# Patient Record
Sex: Female | Born: 1960 | Race: White | Hispanic: No | Smoking: Current some day smoker
Health system: Southern US, Community
[De-identification: ages and names within clinical notes are randomized; demographics above are authoritative.]

## PROBLEM LIST (undated history)

## (undated) DIAGNOSIS — F32A Depression, unspecified: Secondary | ICD-10-CM

## (undated) DIAGNOSIS — G8929 Other chronic pain: Secondary | ICD-10-CM

## (undated) DIAGNOSIS — F319 Bipolar disorder, unspecified: Secondary | ICD-10-CM

## (undated) DIAGNOSIS — G2581 Restless legs syndrome: Secondary | ICD-10-CM

## (undated) DIAGNOSIS — F329 Major depressive disorder, single episode, unspecified: Secondary | ICD-10-CM

## (undated) DIAGNOSIS — I10 Essential (primary) hypertension: Secondary | ICD-10-CM

## (undated) DIAGNOSIS — F419 Anxiety disorder, unspecified: Secondary | ICD-10-CM

## (undated) DIAGNOSIS — E7800 Pure hypercholesterolemia, unspecified: Secondary | ICD-10-CM

## (undated) DIAGNOSIS — R519 Headache, unspecified: Secondary | ICD-10-CM

## (undated) DIAGNOSIS — R51 Headache: Secondary | ICD-10-CM

## (undated) HISTORY — PX: ABDOMINAL HYSTERECTOMY: SHX81

## (undated) HISTORY — PX: TONSILLECTOMY: SUR1361

## (undated) HISTORY — PX: OOPHORECTOMY: SHX86

---

## 1998-01-02 ENCOUNTER — Ambulatory Visit (HOSPITAL_COMMUNITY): Admission: RE | Admit: 1998-01-02 | Discharge: 1998-01-02 | Payer: Self-pay | Admitting: Obstetrics and Gynecology

## 1998-06-19 ENCOUNTER — Other Ambulatory Visit: Admission: RE | Admit: 1998-06-19 | Discharge: 1998-06-19 | Payer: Self-pay | Admitting: Obstetrics and Gynecology

## 1999-08-02 ENCOUNTER — Other Ambulatory Visit: Admission: RE | Admit: 1999-08-02 | Discharge: 1999-08-02 | Payer: Self-pay | Admitting: Obstetrics and Gynecology

## 2000-06-05 ENCOUNTER — Encounter: Admission: RE | Admit: 2000-06-05 | Discharge: 2000-06-05 | Payer: Self-pay | Admitting: Obstetrics and Gynecology

## 2000-06-05 ENCOUNTER — Encounter: Payer: Self-pay | Admitting: Obstetrics and Gynecology

## 2001-07-08 ENCOUNTER — Encounter: Payer: Self-pay | Admitting: Obstetrics and Gynecology

## 2001-07-08 ENCOUNTER — Encounter: Admission: RE | Admit: 2001-07-08 | Discharge: 2001-07-08 | Payer: Self-pay | Admitting: Obstetrics and Gynecology

## 2001-09-28 ENCOUNTER — Other Ambulatory Visit: Admission: RE | Admit: 2001-09-28 | Discharge: 2001-09-28 | Payer: Self-pay | Admitting: Obstetrics and Gynecology

## 2002-08-26 ENCOUNTER — Encounter (INDEPENDENT_AMBULATORY_CARE_PROVIDER_SITE_OTHER): Payer: Self-pay

## 2002-08-26 ENCOUNTER — Observation Stay (HOSPITAL_COMMUNITY): Admission: RE | Admit: 2002-08-26 | Discharge: 2002-08-27 | Payer: Self-pay | Admitting: Obstetrics and Gynecology

## 2002-08-26 ENCOUNTER — Encounter (INDEPENDENT_AMBULATORY_CARE_PROVIDER_SITE_OTHER): Payer: Self-pay | Admitting: Specialist

## 2003-01-31 ENCOUNTER — Other Ambulatory Visit: Admission: RE | Admit: 2003-01-31 | Discharge: 2003-01-31 | Payer: Self-pay | Admitting: Obstetrics and Gynecology

## 2003-07-14 ENCOUNTER — Encounter: Payer: Self-pay | Admitting: Emergency Medicine

## 2003-07-14 ENCOUNTER — Emergency Department (HOSPITAL_COMMUNITY): Admission: EM | Admit: 2003-07-14 | Discharge: 2003-07-14 | Payer: Self-pay | Admitting: Emergency Medicine

## 2003-07-20 ENCOUNTER — Encounter: Payer: Self-pay | Admitting: General Surgery

## 2003-07-20 ENCOUNTER — Ambulatory Visit (HOSPITAL_COMMUNITY): Admission: RE | Admit: 2003-07-20 | Discharge: 2003-07-20 | Payer: Self-pay | Admitting: General Surgery

## 2003-08-09 ENCOUNTER — Encounter: Payer: Self-pay | Admitting: Obstetrics and Gynecology

## 2003-08-09 ENCOUNTER — Encounter: Admission: RE | Admit: 2003-08-09 | Discharge: 2003-08-09 | Payer: Self-pay | Admitting: Obstetrics and Gynecology

## 2003-08-16 ENCOUNTER — Encounter: Payer: Self-pay | Admitting: Gastroenterology

## 2003-08-16 ENCOUNTER — Encounter: Admission: RE | Admit: 2003-08-16 | Discharge: 2003-08-16 | Payer: Self-pay | Admitting: Gastroenterology

## 2003-09-28 IMAGING — CT CT ABDOMEN W/ CM
1 of 4 series · 14 of 32 positions shown, 19 images · non-contrast
Comparison: none

FINDINGS
CLINICAL DATA: RIGHT FLANK AND PELVIC PAIN TIMES THREE DAYS.
CT OF THE ABDOMEN AND PELVIS, 07/14/03
COMPARISONS:  NONE.
TECHNIQUE: CONTIGUOUS 5 MM AXIAL IMAGES WERE OBTAINED FROM THE LUNG BASES THROUGH THE PUBIC
SYMPHYSIS AFTER SPIRAL CT SCANNING OF THE ABDOMEN AND PELVIS.  ORAL AND 150 CC OF NONIONIC
INTRAVENOUS CONTRAST WAS ADMINISTERED PRIOR TO SCANNING.
ABDOMEN
SEVERAL VERY TINY LOW DENSITY LESIONS ARE IDENTIFIED SCATTERED THROUGHOUT THE LIVER PARENCHYMA.
SPLEEN, STOMACH, DUODENUM, PANCREAS, GALLBLADDER, AND ADRENAL GLANDS ARE UNREMARKABLE.  NO FOCAL
ABNORMALITY IS SEEN IN THE RIGHT KIDNEY BUT THERE IS A VERY TINY LOW DENSITY FOCUS IDENTIFIED IN
THE LEFT KIDNEY, TOO SMALL TO CHARACTERIZE.
PATIENT HAS A RETROCECAL APPENDIX WHICH EXTENDS IN A CRANIAL FASHION TO THE INFERIOR TIP OF THE
LIVER.  THERE IS NO EVIDENCE FOR APPENDICEAL THICKENING OR PERIAPPENDICEAL INFLAMMATION.
IMPRESSION
UNREMARKABLE APPEARANCE OF THE APPENDIX.  NO EVIDENCE FOR ACUTE APPENDICITIS.
VERY TINY LOW DENSITY LESIONS SCATTERED IN THE LIVER AND ALSO IN THE LEFT KIDNEY.  THESE ARE
TECHNICALLY TOO SMALL TO CHARACTERIZE.
PELVIS
SCANNING THROUGH THE ANATOMIC PELVIS REVEALS NO FREE FLUID.  THE PATIENT IS STATUS POST
HYSTERECTOMY.  NO ADNEXAL MASSES.  NO EVIDENCE FOR LYMPHADENOPATHY.
STATUS POST HYSTERECTOMY.
OTHERWISE, UNREMARKABLE CT EXAM OF THE PELVIS.

[Series 3: abd/pelvis 5.0 b30f · axial · 0.61mm/px · z∈[-457,-47]mm · 14 of 92 slices shown, 19 images]
[im 5/92  soft-tissue]
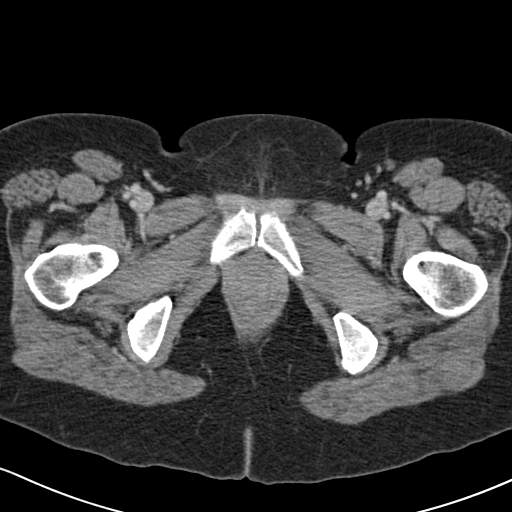
[im 5/92  bone]
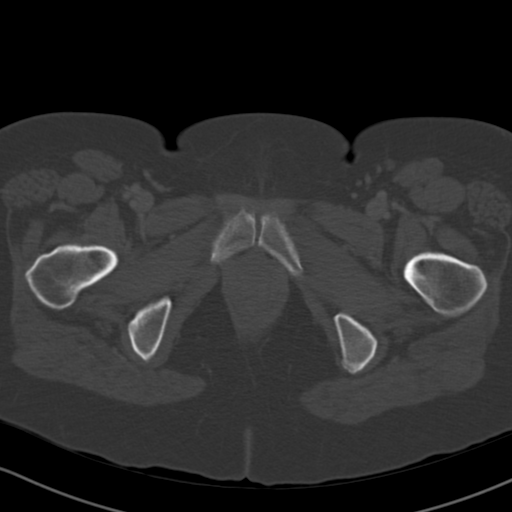
[im 15/92  soft-tissue]
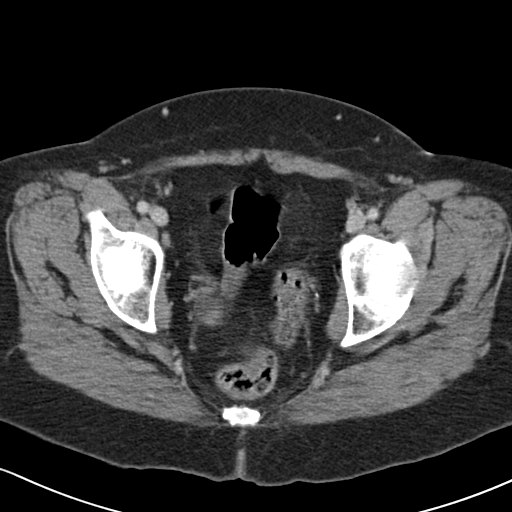
[im 20/92  soft-tissue]
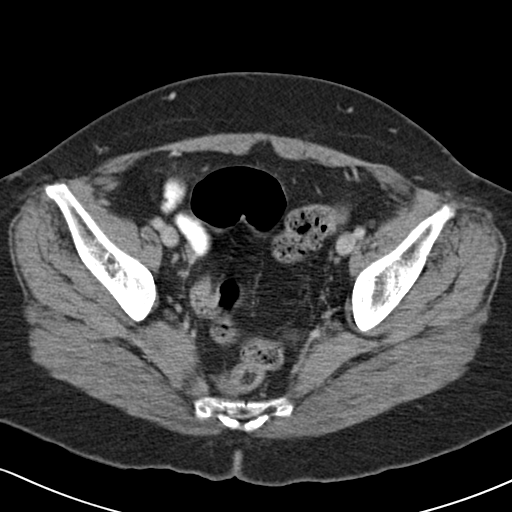
[im 24/92  soft-tissue]
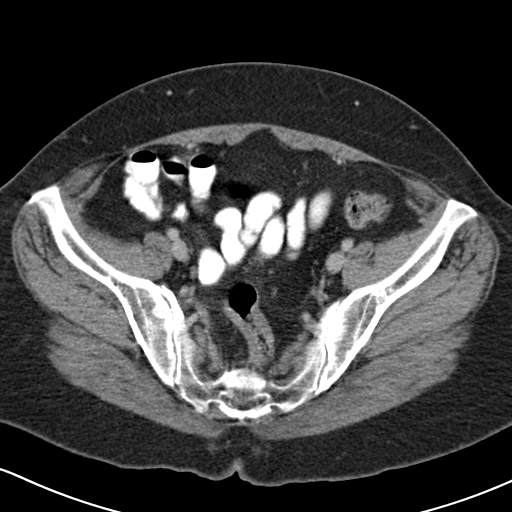
[im 34/92  soft-tissue]
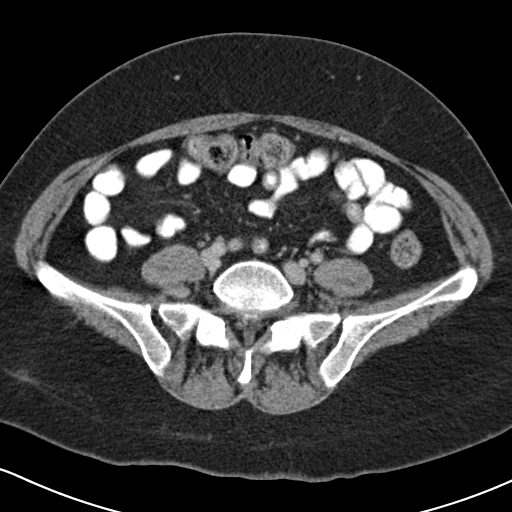
[im 39/92  soft-tissue]
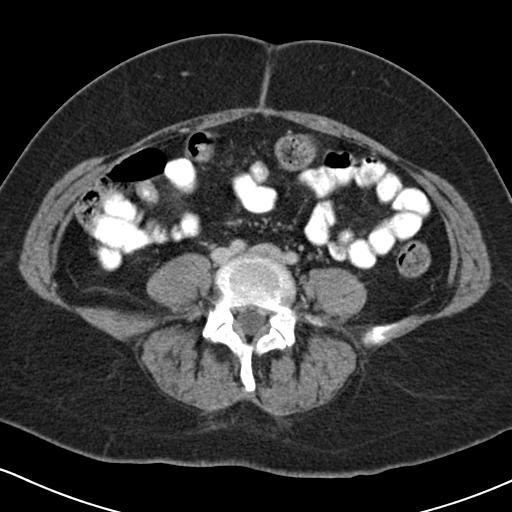
[im 48/92  soft-tissue]
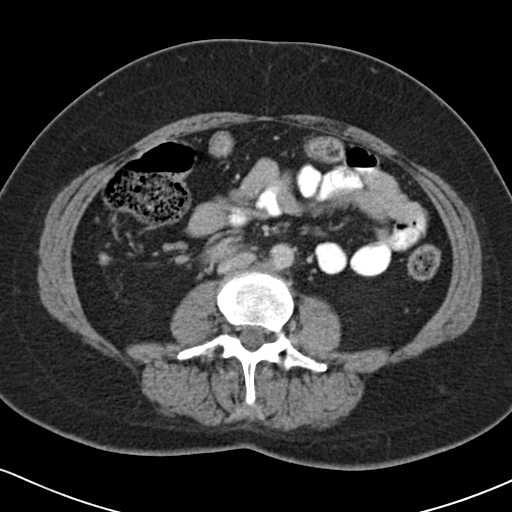
[im 53/92  soft-tissue]
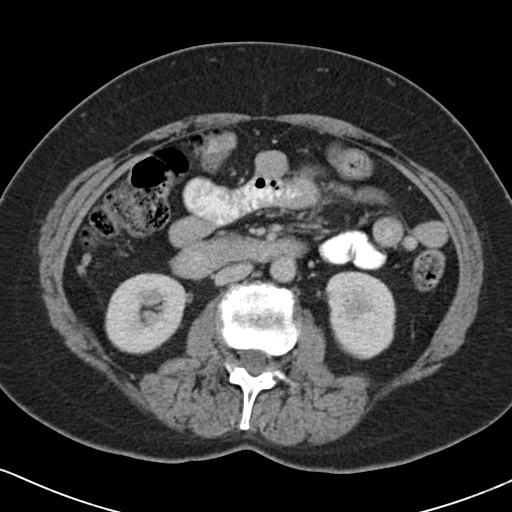
[im 58/92  soft-tissue]
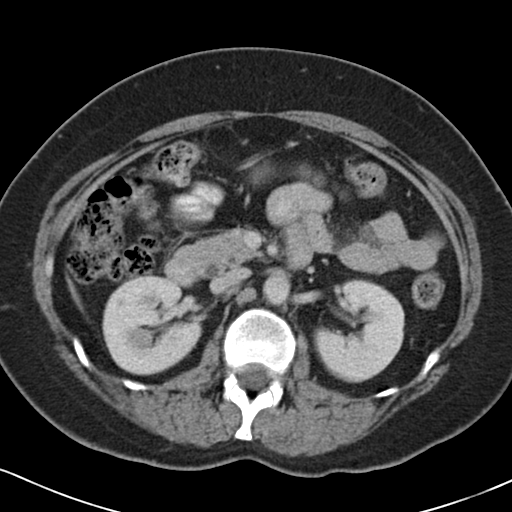
[im 58/92  bone]
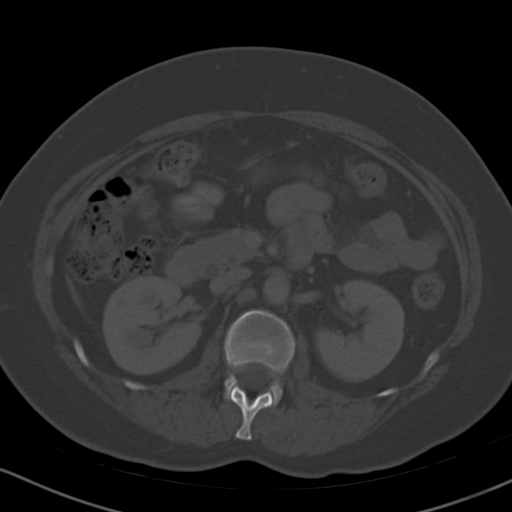
[im 68/92  soft-tissue]
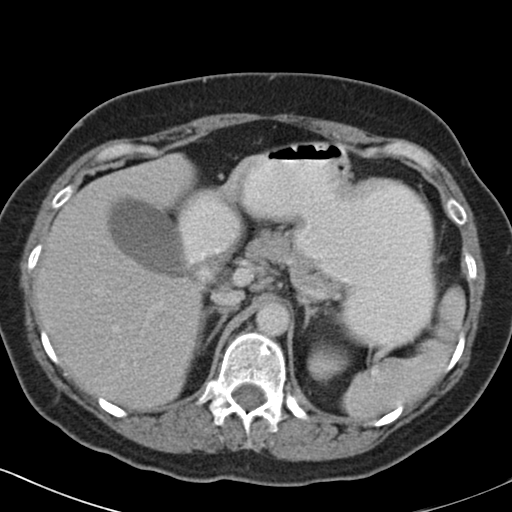
[im 72/92  soft-tissue]
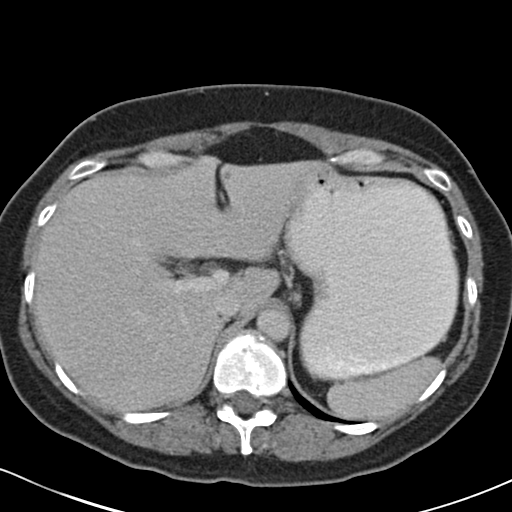
[im 72/92  lung]
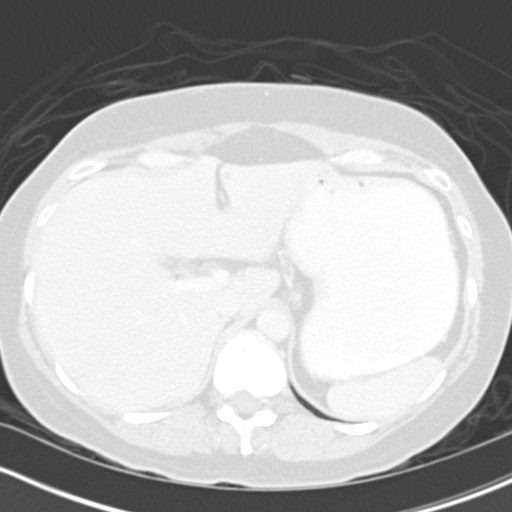
[im 77/92  soft-tissue]
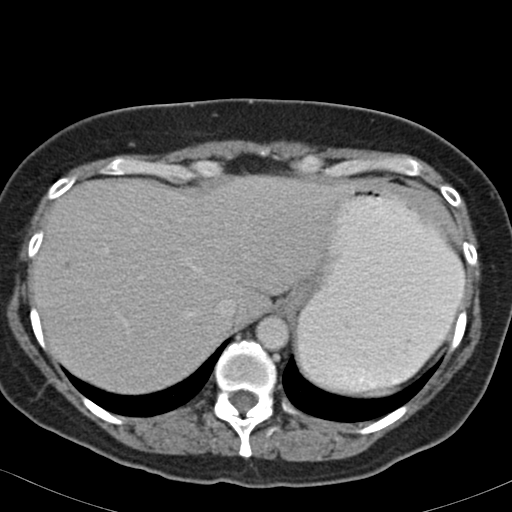
[im 77/92  lung]
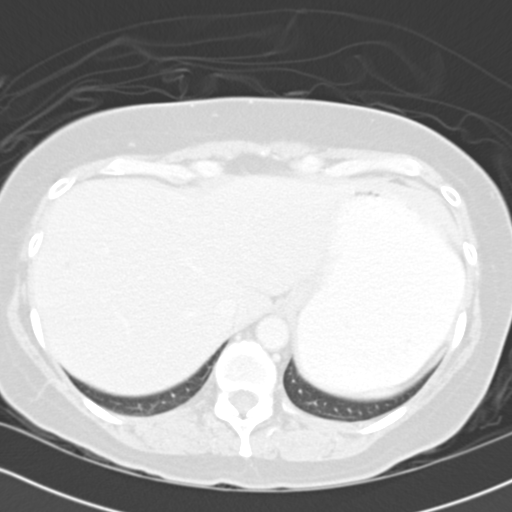
[im 82/92  lung]
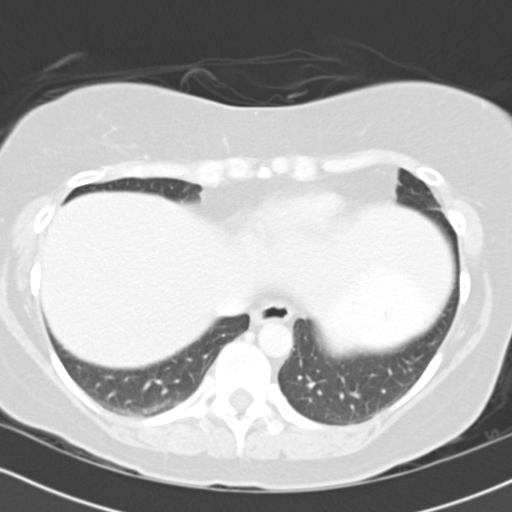
[im 87/92  soft-tissue]
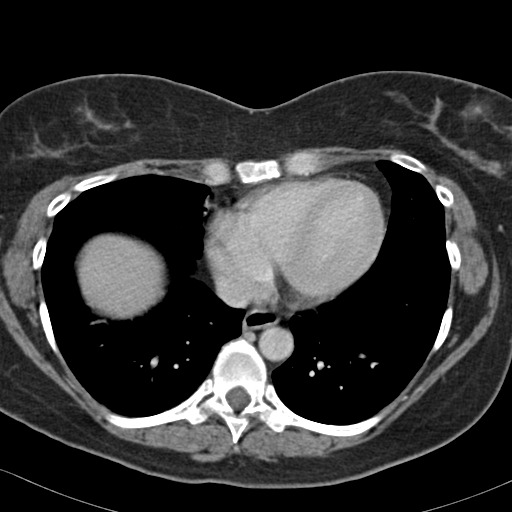
[im 87/92  lung]
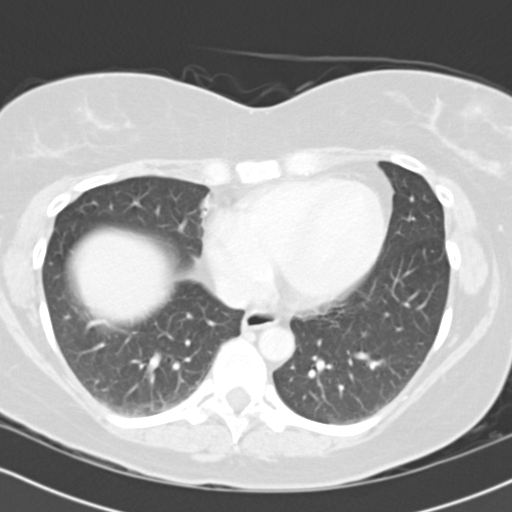

[14 of 32 positions shown; findings below may reference images not displayed]

## 2004-07-17 ENCOUNTER — Emergency Department (HOSPITAL_COMMUNITY): Admission: EM | Admit: 2004-07-17 | Discharge: 2004-07-17 | Payer: Self-pay | Admitting: Emergency Medicine

## 2004-07-18 ENCOUNTER — Ambulatory Visit (HOSPITAL_COMMUNITY): Admission: RE | Admit: 2004-07-18 | Discharge: 2004-07-18 | Payer: Self-pay | Admitting: Gastroenterology

## 2005-01-19 ENCOUNTER — Emergency Department (HOSPITAL_COMMUNITY): Admission: EM | Admit: 2005-01-19 | Discharge: 2005-01-19 | Payer: Self-pay | Admitting: Emergency Medicine

## 2005-09-10 ENCOUNTER — Inpatient Hospital Stay (HOSPITAL_COMMUNITY): Admission: EM | Admit: 2005-09-10 | Discharge: 2005-09-12 | Payer: Self-pay | Admitting: Emergency Medicine

## 2005-09-12 ENCOUNTER — Inpatient Hospital Stay (HOSPITAL_COMMUNITY): Admission: RE | Admit: 2005-09-12 | Discharge: 2005-09-18 | Payer: Self-pay | Admitting: Psychiatry

## 2005-09-12 ENCOUNTER — Ambulatory Visit: Payer: Self-pay | Admitting: Psychiatry

## 2005-09-30 ENCOUNTER — Ambulatory Visit (HOSPITAL_COMMUNITY): Payer: Self-pay | Admitting: Psychiatry

## 2005-10-20 ENCOUNTER — Ambulatory Visit (HOSPITAL_COMMUNITY): Payer: Self-pay | Admitting: Psychiatry

## 2005-11-07 ENCOUNTER — Ambulatory Visit (HOSPITAL_COMMUNITY): Payer: Self-pay | Admitting: Psychiatry

## 2005-12-10 ENCOUNTER — Ambulatory Visit (HOSPITAL_COMMUNITY): Payer: Self-pay | Admitting: Psychiatry

## 2005-12-10 ENCOUNTER — Other Ambulatory Visit: Admission: RE | Admit: 2005-12-10 | Discharge: 2005-12-10 | Payer: Self-pay | Admitting: Obstetrics and Gynecology

## 2005-12-25 ENCOUNTER — Ambulatory Visit (HOSPITAL_COMMUNITY): Admission: RE | Admit: 2005-12-25 | Discharge: 2005-12-25 | Payer: Self-pay | Admitting: Obstetrics and Gynecology

## 2006-04-29 ENCOUNTER — Emergency Department (HOSPITAL_COMMUNITY): Admission: EM | Admit: 2006-04-29 | Discharge: 2006-04-29 | Payer: Self-pay | Admitting: Emergency Medicine

## 2006-06-16 ENCOUNTER — Emergency Department (HOSPITAL_COMMUNITY): Admission: EM | Admit: 2006-06-16 | Discharge: 2006-06-17 | Payer: Self-pay | Admitting: Emergency Medicine

## 2007-05-11 ENCOUNTER — Other Ambulatory Visit: Admission: RE | Admit: 2007-05-11 | Discharge: 2007-05-11 | Payer: Self-pay | Admitting: Obstetrics and Gynecology

## 2007-11-16 ENCOUNTER — Ambulatory Visit: Payer: Self-pay | Admitting: *Deleted

## 2007-11-16 ENCOUNTER — Inpatient Hospital Stay (HOSPITAL_COMMUNITY): Admission: AD | Admit: 2007-11-16 | Discharge: 2007-11-25 | Payer: Self-pay | Admitting: Psychiatry

## 2007-11-20 ENCOUNTER — Ambulatory Visit (HOSPITAL_COMMUNITY): Admission: RE | Admit: 2007-11-20 | Discharge: 2007-11-20 | Payer: Self-pay | Admitting: Psychiatry

## 2009-01-03 ENCOUNTER — Ambulatory Visit: Payer: Self-pay | Admitting: Psychiatry

## 2009-01-03 ENCOUNTER — Inpatient Hospital Stay (HOSPITAL_COMMUNITY): Admission: AD | Admit: 2009-01-03 | Discharge: 2009-01-18 | Payer: Self-pay | Admitting: Psychiatry

## 2009-01-05 ENCOUNTER — Ambulatory Visit (HOSPITAL_COMMUNITY): Admission: RE | Admit: 2009-01-05 | Discharge: 2009-01-05 | Payer: Self-pay | Admitting: Psychiatry

## 2009-01-19 ENCOUNTER — Other Ambulatory Visit (HOSPITAL_COMMUNITY): Admission: RE | Admit: 2009-01-19 | Discharge: 2009-02-23 | Payer: Self-pay | Admitting: Psychiatry

## 2009-10-17 DIAGNOSIS — F429 Obsessive-compulsive disorder, unspecified: Secondary | ICD-10-CM | POA: Insufficient documentation

## 2009-11-16 ENCOUNTER — Encounter: Admission: RE | Admit: 2009-11-16 | Discharge: 2009-11-16 | Payer: Self-pay | Admitting: Family Medicine

## 2009-12-03 DIAGNOSIS — M8580 Other specified disorders of bone density and structure, unspecified site: Secondary | ICD-10-CM | POA: Insufficient documentation

## 2010-05-14 DIAGNOSIS — E785 Hyperlipidemia, unspecified: Secondary | ICD-10-CM | POA: Insufficient documentation

## 2010-05-14 DIAGNOSIS — I10 Essential (primary) hypertension: Secondary | ICD-10-CM | POA: Insufficient documentation

## 2010-08-20 ENCOUNTER — Encounter: Admission: RE | Admit: 2010-08-20 | Discharge: 2010-08-20 | Payer: Self-pay | Admitting: Family Medicine

## 2010-08-20 DIAGNOSIS — F309 Manic episode, unspecified: Secondary | ICD-10-CM | POA: Insufficient documentation

## 2010-09-17 DIAGNOSIS — F3289 Other specified depressive episodes: Secondary | ICD-10-CM | POA: Insufficient documentation

## 2010-09-17 DIAGNOSIS — F329 Major depressive disorder, single episode, unspecified: Secondary | ICD-10-CM | POA: Insufficient documentation

## 2010-10-07 ENCOUNTER — Encounter
Admission: RE | Admit: 2010-10-07 | Discharge: 2010-11-21 | Payer: Self-pay | Source: Home / Self Care | Attending: Family Medicine | Admitting: Family Medicine

## 2010-12-23 ENCOUNTER — Other Ambulatory Visit: Payer: Self-pay | Admitting: Family Medicine

## 2010-12-30 ENCOUNTER — Encounter
Admission: RE | Admit: 2010-12-30 | Discharge: 2010-12-30 | Payer: Self-pay | Source: Home / Self Care | Attending: Family Medicine | Admitting: Family Medicine

## 2011-01-01 ENCOUNTER — Other Ambulatory Visit: Payer: Self-pay | Admitting: Family Medicine

## 2011-01-01 DIAGNOSIS — R928 Other abnormal and inconclusive findings on diagnostic imaging of breast: Secondary | ICD-10-CM

## 2011-01-08 ENCOUNTER — Ambulatory Visit
Admission: RE | Admit: 2011-01-08 | Discharge: 2011-01-08 | Disposition: A | Discharge status: Home / Self Care | Payer: MEDICARE | Source: Ambulatory Visit | Attending: Family Medicine | Admitting: Family Medicine

## 2011-01-08 DIAGNOSIS — R928 Other abnormal and inconclusive findings on diagnostic imaging of breast: Secondary | ICD-10-CM

## 2011-03-18 LAB — CBC
MCV: 89.8 fL (ref 78.0–100.0)
Platelets: 429 10*3/uL — ABNORMAL HIGH (ref 150–400)
RDW: 13.4 % (ref 11.5–15.5)
WBC: 8.4 10*3/uL (ref 4.0–10.5)

## 2011-03-18 LAB — RAPID STREP SCREEN (MED CTR MEBANE ONLY): Streptococcus, Group A Screen (Direct): NEGATIVE

## 2011-03-18 LAB — COMPREHENSIVE METABOLIC PANEL
AST: 25 U/L (ref 0–37)
Albumin: 4 g/dL (ref 3.5–5.2)
Calcium: 9.5 mg/dL (ref 8.4–10.5)
Chloride: 101 mEq/L (ref 96–112)
Creatinine, Ser: 1.07 mg/dL (ref 0.4–1.2)
GFR calc Af Amer: >60 mL/min (ref >60)
Total Bilirubin: 0.5 mg/dL (ref 0.3–1.2)
Total Protein: 6.8 g/dL (ref 6.0–8.3)

## 2011-03-18 LAB — RPR: RPR Ser Ql: NONREACTIVE

## 2011-03-18 LAB — URINALYSIS, ROUTINE W REFLEX MICROSCOPIC
Bilirubin Urine: NEGATIVE
Nitrite: NEGATIVE
Protein, ur: NEGATIVE mg/dL
Specific Gravity, Urine: 1.006 (ref 1.005–1.030)
Urobilinogen, UA: 0.2 mg/dL (ref 0.0–1.0)

## 2011-03-18 LAB — TSH: TSH: 1.915 u[IU]/mL (ref 0.350–4.500)

## 2011-03-18 LAB — VITAMIN B12: Vitamin B-12: 630 pg/mL (ref 211–911)

## 2011-03-18 LAB — MAGNESIUM: Magnesium: 2.5 mg/dL (ref 1.5–2.5)

## 2011-03-24 DIAGNOSIS — G2581 Restless legs syndrome: Secondary | ICD-10-CM | POA: Insufficient documentation

## 2011-04-15 NOTE — Discharge Summary (Signed)
NAMEKANAE, IGNATOWSKI            ACCOUNT NO.:  [id number]   MEDICAL RECORD NO.:  [id number]          PATIENT TYPE:  IPS   LOCATION:  0504                          [facility]ILITY[facility]  [facility]   PHYSICIAN:  Anselm Jungling, MD  DATE OF BIRTH:  02-Jan-1961   DATE OF ADMISSION:  01/03/2009  DATE OF DISCHARGE:  01/18/2009                               DISCHARGE SUMMARY   IDENTIFYING DATA AND REASON FOR ADMISSION:  This was an inpatient  psychiatric hospitalization for [name] Hinton, a 50 year old single white  female admitted for increasing depression and suicidal ideation.  Please  refer to the admission note for further details pertaining to the  symptoms, circumstances and history that led to her hospitalization.  She was given an initial Axis I diagnosis of major depressive disorder,  recurrent.   MEDICAL AND LABORATORY:  The patient was medically and physically  assessed by the psychiatric nurse practitioner.  She came to [place] with a  history of hypertension and GERD.  She was continued on her usual  hydrochlorothiazide, Flonase, and Protonix.  There were no significant  medical issues.   HOSPITAL COURSE:  The patient was admitted to the adult inpatient  psychiatric service.  She presented as a well-nourished, normally-  developed female who was alert, fully oriented, pleasant, but sad.  She  stated I want to die.  There were no signs or symptoms of psychosis.  She reported insomnia.  She denied any suicidal intent, verbalized a  strong desire for help.   She had come to [place] on a regimen of Wellbutrin, Lamictal, Klonopin,  Abilify, and trazodone.  These were all continued, and in addition, a  trial of Lexapro 10 mg daily was initiated.  It was well tolerated.  She  participated in various therapeutic groups and activities and was a good  participant throughout.   There was a family session on the fourth hospital day involving the  patient and her parents, with whom she normally lives.   The  patient's progress was slow.  She continued depressed, hopeless,  with intermittent suicidal ideation over the initial 12 days of her  hospital stay.  However, she made good efforts to work on her problems.  She developed with [place] a tentative plan to step-down to the intensive  outpatient program when safety factors allowed.  This finally occurred  on the 16th hospital day.   AFTERCARE:  As above.  The patient was to resume with her intensive  outpatient treatment the day following discharge, that is on January 19, 2009, at 8:45 a.m.  She was also to follow-up with [facility] for medication management with an appointment on March 2  at 2:00 p.m..   DISCHARGE MEDICATIONS:  1. Lamictal 250 mg daily.  2. Klonopin 0.5 mg daily.  3. Trazodone 300 mg nightly.  4. Metoprolol 50 mg daily.  5. Lexapro 10 mg daily.  6. Wellbutrin SR 200 mg twice daily.  7. Abilify 10 mg b.i.d.  8. Hydrochlorothiazide 25 mg daily.  9. Flonase, 1 spray each nostril x5 days.  10.And Protonix 40 mg daily.  DISCHARGE DIAGNOSES:  AXIS I:  Major depressive disorder, recurrent.  AXIS II:  Deferred.  AXIS III:  History of hypertension, GERD.  AXIS IV:  Stressors severe.  AXIS V:  Global Assessment of Functioning on discharge 45.      Anselm Jungling, MD  Electronically Signed     SPB/MEDQ  D:  01/18/2009  T:  01/19/2009  Job:  [id number]

## 2011-04-18 NOTE — Op Note (Signed)
NAMEREVER, PICHETTE                      ACCOUNT NO.:  [id number]   MEDICAL RECORD NO.:  [id number]                   PATIENT TYPE:  AMB   LOCATION:  ENDO                                 [facility]ACILITY[facility]  [facility]   PHYSICIAN:  Jordan Hawks. Elnoria Howard, MD                 DATE OF BIRTH:  Jul 07, 1961   DATE OF PROCEDURE:  07/18/2004  DATE OF DISCHARGE:                                 OPERATIVE REPORT   PROCEDURE:  Esophagogastroduodenoscopy.   ENDOSCOPIST:  Jordan Hawks. Elnoria Howard, MD   INSTRUMENT USED:  Olympus video EGD scope.   INDICATIONS FOR PROCEDURE:  GERD type symptoms and chest pain.   PREPROCEDURE PREPARATION:  Informed consent was obtained from the patient.  She had fasted overnight for the procedure.   PREPROCEDURE PHYSICAL:  Vital signs were stable. Neck exam was supple, no  lymphadenopathy.  Lungs are clear to auscultation bilaterally.  Cardiovascular regular rate and rhythm without murmur, gallop or rub.  Abdomen was soft, tender in the mid epigastric area. No masses palpate, no  hepatosplenomegaly and positive bowel sounds.   DESCRIPTION OF PROCEDURE:  The patient was placed in the left lateral  decubitus position, sedation was used comprising of 50 mg of Versed and 50  mg of Demerol given IV.  After the patient was properly sedated, the  endoscope was introduced into the esophagus and was noted at the mid  esophagus to have multiple erosions extending from the GE junction. The  patient was also noted to have a hiatal hernia measuring 5 cm which extended  from 40 cm to 45 cm.  The Z line was noted to be up 40 cm and sharp gastric  mucosa was partially obscured by food residue; however, there is no evidence  of active bleeding or old blood. Additionally no evidence of inflammation.  Small bowel inspection of the duodenum was normal.   IMPRESSION:  LA grade A esophagitis extending to the mid esophagus.  Hiatal  hernia and no gross appearance of Barrett's esophagus.                                 Jordan Hawks Elnoria Howard, MD    PDH/MEDQ  D:  07/18/2004  T:  07/18/2004  Job:  [id number]

## 2011-04-18 NOTE — H&P (Signed)
[name] Hinton, [name] Hinton            ACCOUNT NO.:  [id number]   MEDICAL RECORD NO.:  [id number]          PATIENT TYPE:  INP   LOCATION:  1824                         FACILITY:  [facility]   PHYSICIAN:  Corinna L. Lendell Caprice, MDDATE OF BIRTH:  03-Nov-1961   DATE OF ADMISSION:  09/10/2005  DATE OF DISCHARGE:                                HISTORY & PHYSICAL   CHIEF COMPLAINT:  Overdose.   HISTORY OF PRESENT ILLNESS:  [name] Hinton is a 50 year old white female  patient of Dr. Joycelyn Rua, M.D. who presents to the emergency room after  having taken approximately 15 Tylenol PM tablets and 30 mg of Toprol XL.  She took it at 10:45 p.m. on October 10, and subsequently called EMS as she  regretted her action almost immediately.  She reports that she wanted to  sleep and not wake up.  She has been depressed over the past few months  and has not been able to afford Effexor for approximately that period of  time, as she no longer has medical insurance.  She has no previous suicide  attempts, nor does she have any admissions to inpatient psychiatry.  She has  had no vomiting.   PAST MEDICAL HISTORY:  1.  Hypertension.  2.  Depression.  3.  Gastroesophageal reflux disease.  4.  Status post hysterectomy.   MEDICATIONS:  1.  Hydrochlorothiazide 2.5 mg p.o. b.i.d.  2.  She had been on Effexor and Nexium prior to loosing her insurance.   SOCIAL HISTORY:  She smokes.  She drinks, sometimes heavily on the weekends,  more than 2-3 drinks.  She denies drug use.  She is an Engineer, production for [employer].   FAMILY HISTORY:  Significant for depression and hypertension.   REVIEW OF SYSTEMS:  As above, otherwise, negative.   PHYSICAL EXAMINATION:  GENERAL APPEARANCE:  The patient initially was  asleep, but easily arousable.  HEENT:  Normocephalic, atraumatic.  Pupils equal, round and reactive to  light.  Sclerae nonicteric.  Moist mucous membranes.  NECK:  Supple.  No lymphadenopathy.  No thyromegaly.  LUNGS:  Clear to auscultation bilaterally without wheezes, rales or rhonchi.  CARDIOVASCULAR:  Regular rate and rhythm without murmurs, gallops, rubs.  ABDOMEN:  Soft, nontender, nondistended.  GU:  Deferred.  EXTREMITIES:  No cyanosis, clubbing or edema.  Pulses are intact.  SKIN:  She has petechiae over her pretibial areas bilaterally, and some old  bruises on her left thigh.  NEUROLOGICAL:  The patient is alert and oriented.  Cranial nerves and  sensory/motor exam were intact.  PSYCHIATRIC:  The patient has fairly good eye contact.  She is very calm and  cooperative.  She is not tearful and is quite forthcoming with history and  conversation.   LABORATORY DATA:  Her pH is 7.433, PCO2 40, bicarbonate 27, hemoglobin 15,  hematocrit 44.  Full CBC has not been done.  B-met is significant for BUN  21, otherwise, unremarkable.  Cardiac point of care enzymes negative.  Urine  pregnancy negative.  Acetaminophen level at 1 a.m. which is about 2 hours  and 15 minutes after  ingestion is 46.9, salicylate level less than 4.  Urine  drug screen positive for cocaine.  Blood alcohol less than 5.  UA negative  for nitrites, leukocyte esterase or blood.  No ketones.  EKG shows normal  sinus rhythm.   ASSESSMENT/PLAN:  1.  Tylenol PM overdose and Toprol XL overdose.  I discussed the case with      Poison Control.  They do not recommend charcoal at this time.  They      recommend acetaminophen level at 4 hours which is right about now.  I      told the nurse to draw one stat.  They recommend Mucomyst, only if her 4      hour acetaminophen level is greater than or equal to 150.  She will need      also liver function tests and PT/PTT.  She will need a psych consult in      the morning.  For now, she will be on suicide precautions.  She will be      monitored on telemetry for bradycardia and hypotension.  Currently, she      has neither.  2.  Depression.  3.  Gastroesophageal reflux disease.  Resume  proton pump inhibitor for now.  4.  Hypertension.  Medications will be held.  5.  Urine drug screen positive for cocaine.  I have not yet discussed this      with the patient, but I plan to do so immediately after dictation.      Corinna L. Lendell Caprice, MD  Electronically Signed     CLS/MEDQ  D:  09/10/2005  T:  09/10/2005  Job:  [id number]   cc:   Joycelyn Rua, M.D.  Fax: [phone]

## 2011-04-18 NOTE — Discharge Summary (Signed)
NAMEESTEFANI, [name] Hinton NO.:  [id number]   MEDICAL RECORD NO.:  [id number]          PATIENT TYPE:  IPS   LOCATION:  0506                          [facility]ILITY[facility]  [facility]   PHYSICIAN:  Jeanice Lim, M.D. DATE OF BIRTH:  1961/01/05   DATE OF ADMISSION:  09/12/2005  DATE OF DISCHARGE:  09/18/2005                                 DISCHARGE SUMMARY   IDENTIFYING DATA:  This is a 50 year old divorced Caucasian female reporting  life in general had been getting more difficult, going downhill, had been  suicidal for awhile not sure why.  She overdosed the day.  She had used  cocaine which may have been a trigger.  Also urine drug screen was positive  for benzos and cocaine.  She had been seen over at the medical hospital  before being sent to [facility].   PAST PSYCHIATRIC HISTORY:  She has been on Effexor for 7 years after  diagnosed depression, quit this a few months ago.   MEDICATIONS:  Hydrochlorothiazide.   ALLERGIES:  No known drug allergies.   PHYSICAL EXAMINATION:  Physical and neurologic exams essentially within  normal limits, but her blood pressure was elevated at 160/94 and 159/100  range.   MENTAL STATUS EXAM:  Alert and oriented, appropriately dressed.  Fair  hygiene.  No psychomotor abnormalities.  Speech within normal limits.  Mood  depressed.  Affect constricted.  Thought process mostly goal directed.  Cognitively intact.  Fleeting suicidal ideations passive, no intent.  Cognitively intact.  Judgment and insight were impaired.   ADMISSION DIAGNOSES:  AXIS I:  Polysubstance dependence with cocaine and  benzodiazepine dependence.  Possible substance-induced mood disorder.  Depressive disorder, not otherwise specified.  AXIS II:  Deferred.  AXIS III:  Hypertension.  Gastroesophageal reflux disease.  Status post  hysterectomy.  AXIS IV:  Moderate stressors with primary support group and economic  problems.  AXIS V:  25/55-60.   HOSPITAL  COURSE:  Patient was admitted, ordered routine p.r.n. medications,  underwent further monitoring, was encouraged to participate in individual,  group and milieu therapy.  She was set up with dual diagnosis.  Encouraged  to work on relapse prevention plan and was treated, monitored for  withdrawal.  Status post near-lethal overdose.  Described feeling increased  depression for the last few weeks and burdened by the inability to afford  medications.  She has been seeing primary care physician, Joycelyn Rua,  but would like to follow up with a psychiatrist.  She also reports a history  of some ups and downs.  Described mood swings with highs and lows and  possible rapid cycling.  Substance abuse was difficult sort out, but there  is a history of snorting cocaine and alcohol use which varies.  Patient was  stabilized on medications.  Reported a positive response and tolerance.  She  was discharged in improved condition with a more euthymic mood, affect  brighter, no mood lability, no psychotic symptoms, no dangerous ideation.   DISCHARGE MEDICATIONS:  Discharged after medication education in improved  condition on:  1.  Hydrochlorothiazide 12.5 twice a day  2.  Protonix to resume as previous prescribed.  3.  Lamictal 25 mg daily.  4.  Ambien 10 mg q.h.s. p.r.n.  5.  Zoloft 50 mg q.a.m.  6.  Risperdal 0.5 at 8:00 p.m.  Given samples.  7.  Klonopin 0.25 q.a.m. and 0.5 q.h.s.  8.  Requip 0.5 mg at 6:00 p.m.  9.  Vivelle-Dot 0.1 take as prescribed by primary care physician.  10. Toprol 25 daily, take as previously prescribed by Dr. Lenise Arena.   DISCHARGE DIAGNOSES:  AXIS I:  Polysubstance dependence with cocaine and  benzodiazepine dependence.  Possible substance-induced mood disorder.  Depressive disorder, not otherwise specified.  AXIS II:  Deferred.  AXIS III:  Hypertension.  Gastroesophageal reflux disease.  Status post  hysterectomy.  AXIS IV:  Moderate stressors with primary support  group and economic  problems.  AXIS V:  Global assessment of function on discharge 55.   FOLLOW UP:  Patient is to follow up with Dr. Electa Sniff and [facility] outpatient clinic on September 30, 2005 at 1:00 p.m.  and Augustine Radar at [facility] on September 19, 2005 at 1:30.      Jeanice Lim, M.D.  Electronically Signed     JEM/MEDQ  D:  10/08/2005  T:  10/08/2005  Job:  [id number]

## 2011-04-18 NOTE — Consult Note (Signed)
NAMEEUREKA, VALDES                      ACCOUNT NO.:  [id number]   MEDICAL RECORD NO.:  [id number]                   PATIENT TYPE:  EMS   LOCATION:  ED                                   [facility]ACILITY:  [facility]   PHYSICIAN:  Jordan Hawks. Elnoria Howard, MD                 DATE OF BIRTH:  08/03/1961   DATE OF CONSULTATION:  07/17/2004  DATE OF DISCHARGE:                                   CONSULTATION   CHIEF COMPLAINT:  Heart burn/chest pain.   REASON FOR CONSULTATION:  This is a 50 year old white female with a past  medical history of depression, anxiety, high blood pressure and also is  status post hysterectomy in 1991 and left oophorectomy approximately 1 month  ago secondary to fibroids and endometriosis.  She presents to the emergency  department for a 5-day history of epigastric and chest pain as well as some  symptoms of odynophagia.  The patient states that her symptoms were  worsening over the past 2 to 3 months to the point that she had to increase  the head of her bed to the point that she was sleeping while sitting up.  She has been seeing her primary care Ezana Hubbert and within the last month she  did have an episode of chest pain which was attributed to anxiety.  However  her medications were changed in regards to her gastroesophageal reflux  complaints.  The patient was previously treated with Prevacid with good  efficacy, however, secondary to insurance issues she had to be changed over  to Nexium and currently is taking Protonix twice a day.  She not certain  whether she has had any change in her GERD type symptoms however at the time  of presentation.  She states that with any type of p.o. intake she will  experience a significant amount of chest pain.  She denies any cardiac  history and also denies any shortness of breath.  She does report having  some nausea with the symptoms, however, denies any vomiting or any prior  history of hematemesis.  The GERD type symptoms have been  going on or  approximately 5 to 10 years and there is no significant family history of  any gastroesophageal reflux disease.  She denies starting any new type of medications or having any type of  infections.  The main difficulty for this patient at this time is the chest  pain with the swallowing which has resulted in a decreased p.o. intake over  the past 5 days.  Paradoxically her symptoms are relieved if she does lie in  a recumbent position.   PAST MEDICAL HISTORY/ PAST SURGICAL HISTORY:  As stated above in the reason  for consultation.   FAMILY HISTORY:  Noncontributory.   SOCIAL HISTORY:  The patient admits to smoking 1/2 pack day since age 72.  She does drink alcohol on social occasions and has not had any prior DUI  or  DWI and she denies having any type of pain with any type of alcohol  ingestion.  She also denies use of illicit drugs.  She is currently married  and does not have any children.   REVIEW OF SYSTEMS:  SKIN:  Unremarkable.  HEENT:  Unremarkable.  NECK:  Unremarkable.  CARDIOVASCULAR:  Unremarkable.  RESPIRATORY:  Unremarkable.  GI:  Is as stated above in the reason for consultation.  MUSCULOSKELETAL:  Unremarkable.  NEUROLOGIC:  Unremarkable.  IMMUNE/LYMPHATICS:  Unremarkable.   ALLERGIES:  No known drug allergies.   MEDICATIONS:  1. The patient takes Effexor 150 mg p.o. daily.  2. Protonix 30 mg p.o. b.i.d.  3. Hydrochlorothiazide 25 mg p.o. daily.  4. Estrogen patch.   LABORATORY VALUES:  White blood cell count is 8600, hemoglobin 14.4,  platelets 427,000, 1% eosinophils and MCV is 92.  Sodium 137, potassium 3.3,  chloride 104, CO2 27, BUN is 15, creatinine 0.7, glucose is 97, lipase 27,  amylase 33, AST is 27, ALT 26, alkaline phosphatase 101, total bilirubin  1.0.  Urinalysis is negative for any type infection or blood.   CT scan of the abdomen and pelvis is negative for any evidence of acute  appendicitis or gall stones or renal stones.  Ultrasound  was also negative.  EKG reveals normal sinus rhythm.   PHYSICAL EXAMINATION:  VITAL SIGNS:  Blood pressure is 122/86, heart rate  70, respirations 18, temperature is 96.7, pulse oximetry is 98.  GENERAL:  The patient is in no acute distress, alert and oriented and  appears her stated age.  HEENT:  Normocephalic, atraumatic.  Extraocular muscles intact. Pupils are  equal, round and reactive to light.  NECK:  Supple, no lymphadenopathy.  LUNGS:  Clear to auscultation bilaterally.  CARDIOVASCULAR:  Regular rate and rhythm without murmurs, rubs, or gallops.  ABDOMEN:  Soft, tender in the epigastric area, no masses are palpated.  No  rebound tenderness, positive bowel sounds.  No hepatosplenomegaly noted.  There is a transverse  EXTREMITIES:  No clubbing, cyanosis or edema.  SKIN:  Negative for any evidence of rash.   IMPRESSION:  Gastroesophageal reflux disease associated with chest pain,  rule out esophagitis.   PLAN:  The patient's clinical situation was explained to her as well as her  mother in detail.  It is apparent that she does not take Protonix on a  consistent basis and this was relayed to her as well as smoking issue.  It  is uncertain why the patient has developed worsening in her symptoms despite  being on adequate doses of PPI.  She had received efficacy previously when  seen by Dr. Loreta Ave in October, 2004.  However no endoscopy was required at  that time.  Since her symptoms have worsened and she appears to be  refractory to the current dosage of medications it would be prudent to  evaluate her with EGD.  1. Esophagogastroduodenoscopy tomorrow on July 18, 2004 at 7:30 a.m.  2. Continue with Protonix, however to be taken 30 minutes before meals twice     daily.  3. Take Zantac in the evenings before bedtime on an as needed basis.  4. She is to purchase Gaviscon and take 1-2 tablespoons before bedtime.  5. She is also to continue the other anti-reflux measures such as to  keep    the head of the bed elevated and no large meals approximately 3-4 hours     before sleeping.  6. She is also  encouraged to stop smoking.  7. She is instructed to return to the emergency room should her symptoms     worsen or if there is any evidence of fever or hematemesis.                                               Jordan Hawks Elnoria Howard, MD    PDH/MEDQ  D:  07/17/2004  T:  07/18/2004  Job:  [id number]   cc:   [facility]   Devoria Albe, M.D.

## 2011-04-18 NOTE — Discharge Summary (Signed)
   [name], [name]                      ACCOUNT NO.:  [id number]   MEDICAL RECORD NO.:  [id number]                   PATIENT TYPE:  OBV   LOCATION:  9321                                 [facility]ILITY:  [facility]   PHYSICIAN:  Daniel L. Eda Paschal, M.D.           DATE OF BIRTH:  1960-12-19   DATE OF ADMISSION:  08/26/2002  DATE OF DISCHARGE:  08/27/2002                                 DISCHARGE SUMMARY   HOSPITAL COURSE:  The patient is a 50 year old female who was admitted to  the hospital with left lower quadrant pain and a left adnexal mass.  She was  taken to the operating room and diagnostic laparoscopy confirmed pelvic  adhesive disease, endometriomas of the left and right ovary, and a left  hydrosalpinx.  She underwent lysis of adhesions through the laparoscope,  left salpingo-oophorectomy, and right ovarian cystectomy leaving a normal  portion of right ovary.  The surgery went well; however, the surgery did not  end until 3:30.  Because of the large extent of the surgery, it was felt  better to keep the patient overnight for observation.  On the next morning,  she was afebrile, her CBC was stable, her output was excellent, and she was  discharged home.   DISCHARGE MEDICATIONS:  Tylox for pain relief.   DIET:  Soft.   ACTIVITY:  Ambulatory.   CONDITION ON DISCHARGE:  Improved.   DISCHARGE DIAGNOSES:  1. Endometrioma of left and right ovary.  2. Left hydrosalpinx.  3. Pelvic adhesive disease.   OPERATION:  Diagnostic laparoscopy with left salpingo-oophorectomy, right  ovarian cystectomy.                                               Daniel L. Eda Paschal, M.D.    [name] Hinton  D:  08/27/2002  T:  08/29/2002  Job:  [id number]

## 2011-04-18 NOTE — H&P (Signed)
NAMEJALICIA, ROSZAK                      ACCOUNT NO.:  [id number]   MEDICAL RECORD NO.:  [id number]                   PATIENT TYPE:  OBV   LOCATION:  9321                                 [facility]   PHYSICIAN:  Daniel L. Eda Paschal, M.D.           DATE OF BIRTH:  06-01-1961   DATE OF ADMISSION:  08/26/2002  DATE OF DISCHARGE:                                HISTORY & PHYSICAL   CHIEF COMPLAINT:  Persistent left adnexal mass.   HISTORY OF PRESENT ILLNESS:  The patient is a 50 year old gravida 0 who came  to see me in the spring because of a history of chronic left lower quadrant  pain.  On ultrasound she had a 3 cm thick-walled cyst of her right ovary  with an internal inhomogeneous echo pattern, and on her left ovary she had  both a 4 cm cyst as well as a hydrosalpinx.  CA125 was negative.  She had a  history of having endometriosis and fibroids which had been found at the  time of vaginal hysterectomy done in September 1997 by Dr. Malva Limes.  Initially it was elected to watch her.  Her pain has actually gotten a  little bit better, and she was rescanned in August; the mass in her right  ovary has actually decreased in size to about 1.5 cm.  It certainly is  consistent with an endometrioma.  However, the mass on the left has actually  gotten a little bit larger.  It consists of a thin-walled cyst of 4 cm that  is filled with fluid debris internal low level echos, and reticular echo  pattern.  It has vascular flow to it.  There is also an area of fluid around  the cyst of about 3 cm with an echogenic focus near it, and there is also a  tubular mass of 4 cm most consistent with a hydrosalpinx.  The patient was  counseled that this is probably benign and probably consistent with  endometriosis and hydrosalpinx, but she is uncomfortable because of the  issue of discomfort, and also the fact that I could not tell her with  certainty that this is a benign process and so she  now enters the hospital  for diagnostic laparoscopy.  She wanted to go left salpingo-oophorectomy.  We discussed the pros and cons of removing her right ovary.  At 41 the  obvious biggest con is the need for hormone replacement.  She understands  the remote but real risk of ovarian cancer down the road, and she has left  it to my judgment and we have elected to save the right ovary if it is  reasonable to save it.   PAST MEDICAL HISTORY:  Vaginal hysterectomy in 1997.   PRESENT MEDICATIONS:  1. Effexor XR 75 mg q.d.  2. Nexium 40 mg q.d.   ALLERGIES:  None known.   SOCIAL HISTORY:  The patient has two to three glasses  of wine weekly and  half a pack of cigarettes daily, one to two cups of caffeine daily.   FAMILY HISTORY:  The father and sister have hypertension, and has breast  cancer.   REVIEW OF SYSTEMS:  HEENT:  Negative.  CARDIAC:  Negative.  RESPIRATORY:  Negative.  GI:  Reveals a history of hemorrhoids.  GU:  Negative.  NEUROMUSCULAR:  Negative.  NEUROPSYCHIATRIC:  The patient does have a  history of anxiety and depression.   PHYSICAL EXAMINATION:  GENERAL:  The patient is a well-developed and well-  nourished female in no acute distress.  VITAL SIGNS:  Blood pressure 130/70, pulse 80 and regular, respirations 16  and nonlabored.  She is afebrile.  HEENT:  All within normal limits.  NECK:  Supple.  Trachea in the midline.  Thyroid is not enlarged.  LUNGS:  Clear to P&A.  HEART:  No thrills, heaves or murmurs.  BREASTS:  No masses.  ABDOMEN:  Soft without guarding, rebound, or masses.  PELVIC:  External and vaginal are within normal limits.  Pap is normal.  Right adnexa does not appear enlarged.  Left adnexa is enlarged to 5-6 cm.  Rectovaginal is confirmatory.   ADMISSION IMPRESSION:  Persistent left adnexal mass.   PLAN:  Diagnostic laparoscopy with left salpingo-oophorectomy.                                               Daniel L. Eda Paschal, M.D.    Tonette Bihari   D:  08/26/2002  T:  08/27/2002  Job:  [id number]

## 2011-04-18 NOTE — Discharge Summary (Signed)
[name] Hinton, [name] Hinton            ACCOUNT NO.:  [id number]   MEDICAL RECORD NO.:  [id number]          PATIENT TYPE:  IPS   LOCATION:  0506                          [facility]   PHYSICIAN:  Deirdre Peer. Polite, M.D. DATE OF BIRTH:  1961-05-04   DATE OF ADMISSION:  09/12/2005  DATE OF DISCHARGE:  09/18/2005                                 DISCHARGE SUMMARY   DISCHARGE DIAGNOSES:  1.  Suicide attempt by ingestion of Tylenol and beta blocker.  2.  Mildly elevated liver function studies.  3.  History of substance abuse.  4.  Hypertension.  5.  Depression.  6.  Gastroesophageal reflux disease.   DISCHARGE MEDICATIONS:  The patient asked to resume home medications of  Effexor, Nexium, estrogen patch, hydrochlorothiazide, Toprol.   DISPOSITION:  The patient is discharged to a psychiatric facility for  further treatment of psychiatric disorder. The patient was a voluntary  transfer to [facility].   LABORATORY DATA:  Upon admission to the ED, the patient had an acetaminophen  level of 46, salicylate level less than 40. Urine drug screen positive for  cocaine. UA negative for nitrites, leukocyte esterase.   EKG within normal limits.   CONSULTATIONS:  Dr. Jeanie Hinton, Psychiatry.   HISTORY OF PRESENT ILLNESS:  A 50 year old female presented to the emergency  department after a drug overdose with acetaminophen and Toprol Admission was  deemed necessary to further evaluation and treatment. Please see dictated  H&P for further details.   PAST MEDICAL HISTORY:  Per admission H&P. Includes hypertension, depression,  GERD.   PAST SURGICAL HISTORY:  Status post hysterectomy.   ADMISSION MEDICATIONS:  Per admission H&P.   HOSPITAL COURSE:  The patient was admitted to a medicine floor bed for  evaluation and treatment of suicide attempt. The patient's case was  discussed with poison control and based on her levels of acetaminophen, no  Mucomyst was required. The  patient was kept on a monitor, as she supposedly  ingested a beta blocker. The patient was seen by psychiatry who recommended  inpatient psychiatric treatment. The patient did not have any abnormal  incidences during this hospitalization. Ultimately, the patient was  transferred to [facility] as a voluntary transfer  on September 12, 2005.   Thank you in advance.      Deirdre Peer. Polite, M.D.  Electronically Signed     RDP/MEDQ  D:  11/13/2005  T:  11/15/2005  Job:  [id number]

## 2011-04-18 NOTE — Op Note (Signed)
[name] Hinton, [name] Hinton                      ACCOUNT NO.:  [id number]   MEDICAL RECORD NO.:  [id number]                   PATIENT TYPE:  OBV   LOCATION:  9321                                 [facility]   PHYSICIAN:  Daniel L. Eda Paschal, M.D.           DATE OF BIRTH:  1961/08/22   DATE OF PROCEDURE:  08/26/2002  DATE OF DISCHARGE:                                 OPERATIVE REPORT   PREOPERATIVE DIAGNOSIS:  Left adnexal mass.   POSTOPERATIVE DIAGNOSES:  1. Left adnexal mass.  2. Endometrium of left ovary.  3. Pelvic adhesive disease.  4. Left hydrosalpinx.  5. Endometrium of right ovary.   OPERATION:  Diagnostic laparoscopy with left salpingo oophorectomy, lysis of  pelvic adhesions, right ovarian cystectomy.   SURGEON:  Daniel L. Eda Paschal, M.D.   FIRST ASSISTANT:  Devin M. Ciliberti, M.D.   ANESTHESIA:  General endotracheal.   FINDINGS:  At the time of laparoscopy, the patient had a large mass of her  left adnexa.  It was in category of total size of the mass of 6 x 6 cm.  It  consisted of, what I think, was an endometrioma as well as a hydrosalpinx.  It was adherent to mesentery and to large bowel on the left and to the  bladder flap, where she has had a previous hysterectomy.  The right adnexa  also showed dense adhesive disease.  The tube was normal, although only the  proximal portion of the tube was present.  The ovary was adherent to the  broad ligament.  It appeared to be basically a normal ovary, except for a  small 1.5 cm endometrioma.   DESCRIPTION OF PROCEDURE:  After adequate general endotracheal anesthesia,  the patient was placed in the dorsal lithotomy position and prepped and  draped in the usual sterile manner.  A Foley catheter was inserted into her  bladder, and a sponge stick was placed in the vagina.  Initially an attempt  was made to create a pneumoperitoneum with a Veress needle; it was  introduced subumbilically.  The loss of resistance was  normal.  A saline  drop test showed that the peritoneal cavity had been entered, but a  pneumoperitoneum could not be created.  The equipment was checked and it  appeared to be functioning well, yet no pneumoperitoneum could be created.  The needle was replaced; once again proper flow could not be obtained.  The  patient really seemed to be almost starting to get a pneumoperitoneum, but  it was felt dangerous to continue it because the setting remained at zero  rather than that there was any flow.  At this point it was felt to be safer  just to proceed with an open laparoscopy.  The needle was removed.  The  subumbilical incision was extended.  It was taken down to the fascia, which  was opened.  The fascia was whipped with 0 Vicryl to use to  keep a tight  seal.  The peritoneum was identified and entered with sharp dissection.  At  this point gas escaped, convincing the physician that the needle had always  been in the proper spot, and that for some reason good flow was not  obtained.  The laparoscope was placed through the Hasson.  The diagnostic  scope was placed and it was attached to a camera for magnification.  A  pneumoperitoneum was created.  A 5 mm port was placed in the right lower  quadrant and a 10 mm port was placed in the left lower quadrant.  First,  peritoneal washings were obtained, then using hydrodissection using the  harmonic scalpel with the scissors, adhesions were lysed so that the mass  was free from bowel and surrounding tissue.  The ureter was identified.  The  infundibulopelvic ligament was coagulated and cut with the harmonic scalpel.  All attachments of the mass to the broad ligament were taken down with the  harmonic scalpel.  Finally, the mass was free and there was no bleeding.   Attention was turned to the right side.  All adhesions were lysed.  The  endometrioma that had been seen on ultrasound of 1.5 cm was seen; it could  be sharply excised with the  harmonic scalpel.  The rest of the ovary  appeared very, very normal; based on preoperative consultation and the  patient's age, it was elected to save the right ovary.   At this point an Endo pouch was placed, and all specimens were put in the  Endo pouch and removed.  There was still some trouble getting this through  the subumbilical incision.  As a result of that, some fluid was aspirated to  decompress the mass.  This was done in such a fashion in order to not leak  back into the peritoneal cavity.  The patient's ovarian cyst on the left  ruptured, and it looked like an endometrioma.  Once again, it did not leak  because it was in the Endo pouch; finally it could be removed.  The pelvis  was reinspected; there was no bleeding noted.  The two 10 mm fascial  incisions were closed with 0 Vicryl, and the skin incisions were closed with  4-0 Vicryl.   ESTIMATED BLOOD LOSS:  (For the entire procedure).  Less than 100 cc with  none replaced.   DISPOSITION:  The patient tolerated the procedure well and left the  operating room in satisfactory condition.  She is draining clear urine from  her Foley catheter.                                               Daniel L. Eda Paschal, M.D.    Tonette Bihari  D:  08/26/2002  T:  08/27/2002  Job:  [id number]

## 2011-04-18 NOTE — H&P (Signed)
**Note [name] via Obfuscation** [name], [name] NO.:  [id number]   MEDICAL RECORD NO.:  [id number]          PATIENT TYPE:  IPS   LOCATION:  0506                          [facility]   PHYSICIAN:  Jeanice Lim, M.D. DATE OF BIRTH:  12/16/1960   DATE OF ADMISSION:  09/12/2005  DATE OF DISCHARGE:                         PSYCHIATRIC ADMISSION ASSESSMENT   This is a voluntary admission to the services of Dr. Aleatha Borer.   IDENTIFYING INFORMATION:  This is a 50 year old divorced white female.  Apparently she initially presented following an overdose on September 10, 2005.  She is a patient of Dr. Joycelyn Rua.  She had taken approximately  15 Tylenol PM tablets as well as 30 mg of Toprol XL.  She had taken this  close to 11:00 on September 09, 2005.  She called EMS as she regretted her  action almost immediately.  Although she reported that she sleep and not  wake up.  She stated she had been depressed over the past few months and  had not been able to afford her medications for the last few months, and she  no longer has medical insurance.  There were no prior suicide attempts, nor  did she have any prior admissions to inpatient psychiatry.  She was not  noted to have vomited at the time she presented in the emergency department.  Her urine drug screen was positive for cocaine and benzos in the ED.  She  acknowledges having used some cocaine.  She states that ordinarily she is  not a user, but somebody had some and she used it.  She states that this  probably did not improve her mood.  She does acknowledge using wine on  occasion, especially to help her go to sleep.  She does smoke cigarettes.  She is not sure where the benzos came from.   PAST PSYCHIATRIC HISTORY:  She has no prior formal care.  She states that  for 7 years she had been prescribed Effexor, eventually topping out at a  maximum dose of 150 mg a day.  This was prescribed by her gynecologist, Dr.  Eda Paschal.  She quit  it a few months ago as she could no longer afford it.   SOCIAL HISTORY:  She is a high school graduate in [employer].  She has had some  technical training.  She is employed as an Engineer, production for [employer]  She  has been divorced x 2.  She has no children.   FAMILY HISTORY:  She states her father and younger sister both have  depression.  She is unclear what medications they may be prescribed at this  time.   MEDICAL HISTORY AND PRIMARY CARE Guido Comp:  Primary care Fleur Audino is Dr.  Joycelyn Rua, and Dr. Eda Paschal is her OB/GYN.   MEDICAL PROBLEMS:  She is followed for GERD, hypertension.  She is status  post hysterectomy and removal of 1 ovary.   MEDICATIONS:  She had been prescribed  1.  Hydrochlorothiazide 12.5 mg b.i.d.  2.  Toprol XL 25 mg daily for her hypertension.  3.  Effexor XL 150 mg a  day.   DRUG ALLERGIES:  No known drug allergies.   PHYSICAL EXAMINATION:  Well documented on the chart.  Today, other than  increased blood pressure, she had no remarkable physical findings.  We will  be restarting her hydrochlorothiazide and her Toprol.   MENTAL STATUS EXAM:  She was also seen in consult by Dr. Jeanie Sewer in the  hospital.  He noted that she was alert and oriented x 3.  Her speech was  within normal limits.  Her thought processes were linear, clear and goal  directed.  She was still endorsing passive suicidal ideation as well as  hopelessness.  She was negative for hallucinations and delusions.  Her  affect was somewhat constricted.  Her judgment was intact for need for  further treatment.  Her memory was within normal limits.  Her intelligence  is at least average.  Her assessment today is congruent with Dr.  Providence Crosby.  Specifically today she is denying suicidal or homicidal  ideation.  She denies auditory or visual hallucinations.   ADMISSION DIAGNOSES:  AXIS I:  Major depressive disorder, recurrent, severe.  Polysubstance abuse.  AXIS II:  Deferred.  AXIS III:   Status post overdose.  Hormonal dysregulation secondary to  oophorectomy.  AXIS IV:  Recent divorce with lost insurance benefits.  AXIS V:  30.   PLAN:  Restart her antihypertensive agents, her Toprol and her  hydrochlorothiazide.  Dr. Kathrynn Running will discuss with her which antidepressant  would be best in view of her economic situation to get started on.      Mickie Leonarda Salon, P.A.-C.      Jeanice Lim, M.D.  Electronically Signed    MD/MEDQ  D:  09/13/2005  T:  09/13/2005  Job:  [id number]

## 2011-04-18 NOTE — Discharge Summary (Signed)
NAMESHATONYA, PASSON NO.:  [id number]   MEDICAL RECORD NO.:  [id number]          PATIENT TYPE:  IPS   LOCATION:  0507                          [facility]ILITY[facility]   PHYSICIAN:  Jasmine Pang, M.D. DATE OF BIRTH:  1961-04-08   DATE OF ADMISSION:  11/16/2007  DATE OF DISCHARGE:  11/25/2007                               DISCHARGE SUMMARY   IDENTIFYING INFORMATION:  A 50 year old white female who was admitted on  a voluntary basis on November 16, 2007.   HISTORY OF PRESENT ILLNESS:  The patient presented in Mental Health  feeling depressed and suicidal.  She had lost her job on the prior day  due to job cut backs.  She states she wanted to go to sleep and not wake  up.  She feels hopeless.  No reason to go on.  She has had 4 weeks of  anhedonia and increased irritability with agitation and anger.  She has  been experiencing insomnia with less than 3 hours of sleep nightly.  She  states she wants to give up.  Concentration was poor.  She was having  intrusive thoughts.  She sees Dr. Hortencia Pilar at the [facility].  She  has been diagnosed with bipolar disorder since October 2001.  She had a  prior suicide attempt in October 2006.   Past medications include Zoloft, which made her feel numb.  Risperdal,  which caused weight gain and Lamictal.   She has a sister who has depression.   She has GERD, hypertension, and history of tachycardia.   She is currently on Lamictal, Klonopin, Cymbalta, and Wellbutrin.   She has no known drug allergies.   PHYSICAL EXAM:  Physical exam was done with no acute medical or physical  problems noted.   ADMISSION LABORATORIES:  The patient's CBC was grossly within normal  limits.  Basic metabolic panel was within normal limits.  Hepatic  profile was within normal limits.   HOSPITAL COURSE:  Upon admission, the patient was continued on her home  medications of Lamictal 200 mg daily, Wellbutrin XL 150 mg daily,  Klonopin 0.5 mg p.o.  b.i.d., Cymbalta 60 mg daily, and she was also  started on Ambien 10 mg p.o. q.h.s. p.r.n.  On November 17, 2007, she  was continued on her home medications of hydrochlorothiazide 37.5 mg  daily, Protonix 40 mg daily, Cymbalta 30 mg daily, and Klonopin 0.5 mg  p.o. b.i.d. p.r.n. anxiety.  Lamictal was increased to 250 mg daily and  Abilify 5 mg daily at supper and Fioricet 1-2 tablets with onset of  headache and p.r.n.  On November 18, 2007, she was started on trazodone  50 mg p.o. q.h.s. p.r.n.  On November 20, 2007, she was started on K-Dur  20 mEq daily, Wellbutrin was increased to 300 mg XL daily.  Abilify was  increased to 10 mg daily and trazodone was increased to 100 mg q.h.s.  with may repeat x1.  The patient tolerated these medications well with  no significant side effects.  She was friendly and cooperative with me  in individual sessions.  She discussed her  history of depression over  the past 6 months.  Sleep had been poor.  Appetite was poor.  She denied  any abuse of alcohol or drugs.  She states she uses alcohol socially.  She was having difficulty sleeping with middle of the night awakening  due to racing thoughts.  Mood was initially depressed and anxious.  She  talked about losing her job, which had been upsetting for her.  She did  state her parents have been supportive.  Her mood continued to be  depressed and anxious.  On November 19, 2007, she was planning to have a  family session with her parents and felt good about this.  She met with  her mother and father and the family therapist.  Main concerns addressed  were revolving around discharge planning and finding support to the  patient upon discharge.  She spoke about her plan to return to the  [facility] [employer] and start going back to support group.  The patient's  parents praise the patient for work and her improvement, but the patient  stated she did not feel she was getting any better.  She stated she felt  like  she had to be the clam that she cannot let others know how  depressed she is.  On November 22, 2007, mental status continued to be  depressed and anxious.  She stated, I am going to freak out or  something.  She was having no side effects to her various medications  except ? some headaches.  Family visits were going well.  As  hospitalization progressed, mental status improved.  She was hoping to  go home for Christmas Eve, but today she did not feel well enough.  She  still had some dysphoria and irritability and no suicidal ideation.  Sleep and appetite were good.  On November 25, 2007, her mental status  had improved markedly from admission status.  Her sleep and appetite  were good.  Mood was less depressed, less anxious, not irritable.  Affect was consistent with mood.  There was no suicidal or homicidal  ideation.  No thoughts of self-injurious behavior.  No auditory or  visual hallucinations.  No paranoia or delusions.  Thoughts were logical  and goal-directed.  Thought content, no predominant theme.  Cognitive  was grossly back to baseline.   DISCHARGE DIAGNOSES:  Axis I:  Bipolar disorder, one mixed state.  Axis II:  None.  Axis III:  Hypertension and gastroesophageal reflux disease.  Axis IV:  Severe (issues with symptom management and occupational  problem, economic problem, burden of psychiatric illness).  Axis V:  Global assessment of function was 50 upon discharge.  Global  assessment of function was 40 upon admission.  Global assessment of  function highest past year was 66.   DISCHARGE PLANS:  There were no specific activity level or dietary  restrictions.   POST-HOSPITAL CARE PLANS:  The patient will see Dr. Joni Reining at the  [facility] on  December 07, 2007 at 10:30 a.m.   DISCHARGE MEDICATIONS:  1. Klonopin  0.5 mg b.i.d. p.r.n. anxiety.  2. Hydrochlorothiazide 37.5 mg daily.  3. Protonix 40 mg daily.  4. Cymbalta 30 mg daily.  5. Lamictal 250 mg p.o. q.h.s.   6. Vivelle-Dot take as directed.  7. K-Dur 20 mEq daily.  8. Wellbutrin XL 300 mg daily.  9. Abilify 10 mg daily.  10.Trazodone 50 mg at bedtime, may repeat dose if needed.   The patient was to follow  up for her headaches at [facility] on Monday November 29, 2007, at 9:00 a.m. with Dr.  Darrow Bussing.      Jasmine Pang, M.D.  Electronically Signed    BHS/MEDQ  D:  12/16/2007  T:  12/16/2007  Job:  [id number]

## 2011-07-17 ENCOUNTER — Other Ambulatory Visit: Payer: Self-pay | Admitting: Family Medicine

## 2011-07-17 DIAGNOSIS — D249 Benign neoplasm of unspecified breast: Secondary | ICD-10-CM

## 2011-07-24 ENCOUNTER — Ambulatory Visit
Admission: RE | Admit: 2011-07-24 | Discharge: 2011-07-24 | Disposition: A | Discharge status: Home / Self Care | Payer: Medicare Other | Source: Ambulatory Visit | Attending: Family Medicine | Admitting: Family Medicine

## 2011-07-24 DIAGNOSIS — D249 Benign neoplasm of unspecified breast: Secondary | ICD-10-CM

## 2011-08-03 ENCOUNTER — Emergency Department (HOSPITAL_COMMUNITY): Payer: Medicare Other

## 2011-08-03 ENCOUNTER — Inpatient Hospital Stay (HOSPITAL_COMMUNITY)
Admission: EM | Admit: 2011-08-03 | Discharge: 2011-08-07 | DRG: 195 | Disposition: A | Discharge status: Home / Self Care | Payer: Medicare Other | Attending: Internal Medicine | Admitting: Internal Medicine

## 2011-08-03 DIAGNOSIS — R7401 Elevation of levels of liver transaminase levels: Secondary | ICD-10-CM | POA: Diagnosis present

## 2011-08-03 DIAGNOSIS — E785 Hyperlipidemia, unspecified: Secondary | ICD-10-CM | POA: Diagnosis present

## 2011-08-03 DIAGNOSIS — I10 Essential (primary) hypertension: Secondary | ICD-10-CM | POA: Diagnosis present

## 2011-08-03 DIAGNOSIS — F17200 Nicotine dependence, unspecified, uncomplicated: Secondary | ICD-10-CM | POA: Diagnosis present

## 2011-08-03 DIAGNOSIS — E876 Hypokalemia: Secondary | ICD-10-CM | POA: Diagnosis present

## 2011-08-03 DIAGNOSIS — F319 Bipolar disorder, unspecified: Secondary | ICD-10-CM | POA: Diagnosis present

## 2011-08-03 DIAGNOSIS — R7402 Elevation of levels of lactic acid dehydrogenase (LDH): Secondary | ICD-10-CM | POA: Diagnosis present

## 2011-08-03 DIAGNOSIS — J189 Pneumonia, unspecified organism: Principal | ICD-10-CM | POA: Diagnosis present

## 2011-08-03 DIAGNOSIS — K219 Gastro-esophageal reflux disease without esophagitis: Secondary | ICD-10-CM | POA: Diagnosis present

## 2011-08-03 DIAGNOSIS — F341 Dysthymic disorder: Secondary | ICD-10-CM | POA: Diagnosis present

## 2011-08-03 LAB — DIFFERENTIAL
Basophils Absolute: 0 10*3/uL (ref 0.0–0.1)
Basophils Relative: 0 % (ref 0–1)
Lymphocytes Relative: 10 % — ABNORMAL LOW (ref 12–46)
Neutro Abs: 12.8 10*3/uL — ABNORMAL HIGH (ref 1.7–7.7)
Neutrophils Relative %: 81 % — ABNORMAL HIGH (ref 43–77)

## 2011-08-03 LAB — CBC
HCT: 36.1 % (ref 36.0–46.0)
Hemoglobin: 12.5 g/dL (ref 12.0–15.0)
MCHC: 34.6 g/dL (ref 30.0–36.0)
WBC: 15.8 10*3/uL — ABNORMAL HIGH (ref 4.0–10.5)

## 2011-08-03 LAB — POCT I-STAT, CHEM 8
HCT: 37 % (ref 36.0–46.0)
Hemoglobin: 12.6 g/dL (ref 12.0–15.0)
Potassium: 3.4 mEq/L — ABNORMAL LOW (ref 3.5–5.1)
Sodium: 139 mEq/L (ref 135–145)
TCO2: 25 mmol/L (ref 0–100)

## 2011-08-04 LAB — CBC
HCT: 31.8 % — ABNORMAL LOW (ref 36.0–46.0)
MCV: 90.3 fL (ref 78.0–100.0)
RBC: 3.52 MIL/uL — ABNORMAL LOW (ref 3.87–5.11)
RDW: 13.1 % (ref 11.5–15.5)
WBC: 12.8 10*3/uL — ABNORMAL HIGH (ref 4.0–10.5)

## 2011-08-04 LAB — COMPREHENSIVE METABOLIC PANEL
ALT: 37 U/L — ABNORMAL HIGH (ref 0–35)
Albumin: 2.4 g/dL — ABNORMAL LOW (ref 3.5–5.2)
Alkaline Phosphatase: 82 U/L (ref 39–117)
BUN: 10 mg/dL (ref 6–23)
Chloride: 107 mEq/L (ref 96–112)
GFR calc Af Amer: >60 mL/min (ref >60)
Glucose, Bld: 98 mg/dL (ref 70–99)
Potassium: 3.7 mEq/L (ref 3.5–5.1)
Sodium: 138 mEq/L (ref 135–145)
Total Bilirubin: 0.3 mg/dL (ref 0.3–1.2)
Total Protein: 5.7 g/dL — ABNORMAL LOW (ref 6.0–8.3)

## 2011-08-04 LAB — MAGNESIUM: Magnesium: 2.1 mg/dL (ref 1.5–2.5)

## 2011-08-04 NOTE — H&P (Signed)
NAMEPHILANA, [name] Hinton NO.:  [id number]  MEDICAL RECORD NO.:  [id number]  LOCATION:  1515                         FACILITY:  [facility]  PHYSICIAN:  Marinda Elk, M.D.DATE OF BIRTH:  [birth year]  DATE OF ADMISSION:  08/03/2011 DATE OF DISCHARGE:                             HISTORY & PHYSICAL   PRIMARY CARE DOCTOR:  Lovenia Kim, DO  CHIEF COMPLAINT:  Cough.  HISTORY OF PRESENT ILLNESS:  This is a 50 year old female with past medical history of suicide attempt, also past medical history of hypertension that comes in for cough.  She relates the cough has been going on for 2 weeks, but has progressively gotten worse in the last 3 days.  She has also been having some fevers and has been short of breath.  She cannot walk to the bathroom without getting short of breath.  Her family is at bedside, and her father recalls that she has been coughing for about a week actually, for about 2 weeks actually, not for 3 days that she said at the beginning and is progressively getting worse.  So, we were asked to admit and further evaluate.  She relates some fevers, but no chills, some nausea, and some loss of consciousness on Saturday.  She did not hit her head she recalls.  No diarrhea.  No burning when she urinates.  ALLERGIES:  No known drug allergies.  PAST MEDICAL HISTORY: 1. Tylenol overdose and beta-blockers. 2. Elevated LFTs. 3. Hypertension. 4. History of tobacco abuse. 5. Depression. 6. GERD. 7. She also has a history of a bipolar disorder.  MEDICATIONS:  She is on: 1. Vitamin B complex. 2. Coenzyme Q 200 mg daily. 3. Multivitamin 1 tab daily. 4. Vitamin D over-the-counter daily. 5. Aspirin 81 mg daily. 6. Fish oil 1200 mg daily. 7. Clonazepam 1 mg t.i.d. 8. __________100 mg at bedtime. 9. Seroquel 200 mg at bedtime. 10.Pravachol 40 mg daily. 11.Metoprolol 25 mg daily. 12.Omeprazole 40 mg daily. 13.Lamotrigine 150 mg every  morning. 14.Gabapentin 800 mg t.i.d. 15.Seroquel 25 mg t.i.d.  SOCIAL HISTORY:  She smokes about half pack per day; drinks occasionally, but heavy on weekends.  Denies drugs.  FAMILY HISTORY:  Depression and hypertension.  REVIEW OF SYSTEMS:  10-point review of system done, pertinent positive per HPI.  PHYSICAL EXAMINATION:  VITAL SIGNS:  Temperature 98, pulse of 104, blood pressure 118/60.  She was satting 91% on 2 L, breathing 18 times minute. GENERAL:  She is awake, alert, and oriented x3, coherent for language. HEENT:  Moist mucous membranes.  Anicteric.  No pallor, atraumatic, normocephalic. NECK:  No JVD.  No bruit.  No thyromegaly. CARDIOVASCULAR:  She has a regular rate and rhythm with positive S1, S2. No murmurs, rubs, or gallops. LUNGS:  Good air movement.  Clear to auscultation. ABDOMEN:  Positive bowel sounds, nontender, nondistended.  Soft. EXTREMITIES:  Positive pulses.  No clubbing, cyanosis, or edema. SKIN:  She had no ulcerations or rashes. NEURO:  Cranial nerves II through XII are grossly intact.  She is awake, alert, and oriented x3, coherent fluent language and nonfocal.  LABORATORY DATA:  Labs on admission show a white count of 15.8, hemoglobin  of 12, platelet count 325, ANC of 12.8.  Sodium 139, potassium 3.4, chloride 103, glucose of 103, BUN of 17, creatinine 0.8, bicarb of 25.  Her chest x-ray showed a right middle lobe opacity representing pneumonia.  ASSESSMENT AND PLAN: 1. Community-acquired pneumonia.  We will start on Rocephin and     azithromycin.  We will repeat CBC in the morning.  If much better,     can probably change it to pills and probably be discharged in a day     or so if her saturations are improved. 2. Hypokalemia.  Probably secondary to nausea and vomiting.  We will     replete and we will check a mag, and we will continue her on     Protonix. 3. Hypertension.  Continue metoprolol.     Marinda Elk,  M.D.     AF/MEDQ  D:  08/03/2011  T:  08/03/2011  Job:  [id number]  cc:   Lovenia Kim, D.O. Fax: [phone]  Electronically Signed by Marinda Elk M.D. on 08/04/2011 06:56:29 AM

## 2011-08-05 ENCOUNTER — Inpatient Hospital Stay (HOSPITAL_COMMUNITY): Payer: Medicare Other

## 2011-08-05 LAB — DIFFERENTIAL
Basophils Relative: 1 % (ref 0–1)
Eosinophils Absolute: 0.1 10*3/uL (ref 0.0–0.7)
Eosinophils Relative: 1 % (ref 0–5)
Lymphs Abs: 2.1 10*3/uL (ref 0.7–4.0)
Monocytes Relative: 12 % (ref 3–12)
Neutrophils Relative %: 71 % (ref 43–77)

## 2011-08-05 LAB — RAPID URINE DRUG SCREEN, HOSP PERFORMED
Amphetamines: NOT DETECTED
Barbiturates: NOT DETECTED
Benzodiazepines: NOT DETECTED
Cocaine: POSITIVE — AB

## 2011-08-05 LAB — URINALYSIS, ROUTINE W REFLEX MICROSCOPIC
Glucose, UA: NEGATIVE mg/dL
Specific Gravity, Urine: 1.011 (ref 1.005–1.030)
Urobilinogen, UA: 1 mg/dL (ref 0.0–1.0)
pH: 6 (ref 5.0–8.0)

## 2011-08-05 LAB — COMPREHENSIVE METABOLIC PANEL
ALT: 33 U/L (ref 0–35)
AST: 51 U/L — ABNORMAL HIGH (ref 0–37)
Albumin: 2.5 g/dL — ABNORMAL LOW (ref 3.5–5.2)
Calcium: 8.3 mg/dL — ABNORMAL LOW (ref 8.4–10.5)
Creatinine, Ser: 0.6 mg/dL (ref 0.50–1.10)
Sodium: 142 mEq/L (ref 135–145)

## 2011-08-05 LAB — CBC
MCH: 29.3 pg (ref 26.0–34.0)
MCV: 90.3 fL (ref 78.0–100.0)
Platelets: 305 10*3/uL (ref 150–400)
RDW: 13.1 % (ref 11.5–15.5)
WBC: 12.9 10*3/uL — ABNORMAL HIGH (ref 4.0–10.5)

## 2011-08-05 LAB — URINE MICROSCOPIC-ADD ON

## 2011-08-06 ENCOUNTER — Inpatient Hospital Stay (HOSPITAL_COMMUNITY): Payer: Medicare Other

## 2011-08-06 LAB — BASIC METABOLIC PANEL
CO2: 23 mEq/L (ref 19–32)
Calcium: 8.9 mg/dL (ref 8.4–10.5)
GFR calc non Af Amer: >60 mL/min (ref >60)
Sodium: 141 mEq/L (ref 135–145)

## 2011-08-06 LAB — CBC
MCH: 29.5 pg (ref 26.0–34.0)
Platelets: 286 10*3/uL (ref 150–400)
RBC: 3.87 MIL/uL (ref 3.87–5.11)

## 2011-08-06 LAB — DIFFERENTIAL
Basophils Absolute: 0.1 10*3/uL (ref 0.0–0.1)
Eosinophils Absolute: 0.2 10*3/uL (ref 0.0–0.7)
Lymphocytes Relative: 18 % (ref 12–46)
Neutro Abs: 8.3 10*3/uL — ABNORMAL HIGH (ref 1.7–7.7)
Neutrophils Relative %: 67 % (ref 43–77)

## 2011-08-08 NOTE — Discharge Summary (Signed)
NAMEAVAH, BASHOR NO.:  [id number]  MEDICAL RECORD NO.:  [id number]  LOCATION:  1515                         FACILITY:  [facility]  PHYSICIAN:  Andreas Blower, MD       DATE OF BIRTH:  [birth year]  DATE OF ADMISSION:  08/03/2011 DATE OF DISCHARGE:  08/07/2011                              DISCHARGE SUMMARY   PRIMARY CARE PHYSICIAN:  Maryelizabeth Rowan, M.D.  DISCHARGE DIAGNOSIS: 1. Community-acquired pneumonia. 2. Hypertension. 3. Hyperlipidemia. 4. Depression. 5. Anxiety. 6. Tobacco abuse. 7. GERD. 8. Mild transaminitis. 9. History of bipolar disorder.  DISCHARGE MEDICATIONS: 1. Albuterol inhaler 2 puffs every 6 hours as needed for shortness of     breath. 2. Azithromycin 250 mg p.o. daily. 3. Cefuroxime 500 mg p.o. twice daily with meals for 10 days. 4. Guaifenesin 600 mg p.o. twice daily. 5. Guaifenesin/DM 100 - 10 mg per 5 mL syrup every 4 hours as needed     for cough. 6. Polyethylene glycol 17 g p.o. daily, hold for diarrhea. 7. Aspirin 81 mg p.o. 2 tablets p.o. daily. 8. Clonazepam 1 mg p.o. three times a day as needed for anxiety. 9. CoQ10 200 mg p.o. daily. 10.Fish oil 200 mg p.o. daily 11.Gabapentin 800 mg p.o. three times a day. 12.Lamotrigine  150 mg p.o. q.a.m. 13.Metoprolol XL 25 mg p.o. q.p.m. 14.Multivitamin 1 tablet p.o. daily. 15.Omeprazole 40 mg p.o. q.a.m. 16.Pravastatin 40 mg p.o. daily at bedtime. 17.Quetiapine 200 mg p.o. daily at bedtime. 18.Quetiapine 25 mg p.o. three times a day. 19.Trazodone 300 mg p.o. daily at bedtime. 20.Vitamin B complex over the counter 1 tablet p.o. daily. 21.Vitamin D over the counter 1 tablet p.o. daily.  BRIEF ADMITTING HISTORY AND PHYSICAL:  Ms. Dohn is a 50 year old Caucasian female with history of hypertension, tobacco use who presented with cough.  RADIOLOGIC STUDIES/IMAGING:  Patient had chest x-ray 2-view on August 03, 2011, which showed a right middle lung opacity, likely  representing pneumonia.  Patient had portable chest x-ray on August 05, 2011, which showed right lung lower lobe opacity suspicious for pneumonia.  Patient had another portable chest x-ray on August 06, 2011, which showed a little change.  Slightly improved aeration in the right perihilar region.  LABS:  CBC shows a white count of 12.3, hemoglobin 11.4, hematocrit 34.5, platelet count 286.  Electrolytes normal with BUN of 6, creatinine 0.54.  Liver function tests normal except AST is 51, albumin is 2.5, calcium is 8.3.  Urine drug screen positive for cocaine.  UA is negative for nitrites and leukocytes.  HOSPITAL COURSE: 1. Community-acquired pneumonia.  Initially, the patient was started     on ceftriaxone and azithromycin.  During the course of hospital     stay as her breathing improved, patient's antibiotics were     transitioned to cefuroxime.  She will continue cefuroxime for 10     more days to complete at least 14-day course of antibiotics.  The     patient will be on azithromycin for one more day to complete a 5-     day course of azithromycin.  During the course of her hospital     stay, patient was  placed on oxygen.  A walking desaturation was     done on August 07, 2011, which showed her oxygenation was 84-87%     on room air with activity.  As a result we will arrange for home     oxygen.  Dr. Duanne Guess, her primary care physician to assess continued     need for oxygen in few weeks. 2. Hypertension, stable.  Continue the patient on home medications. 3. Hyperlipidemia.  Continue the patient on statin. 4. Leukocytosis, likely due to community-acquired pneumonia. 5. Mild anemia, likely due to acute illness and may be delusional. 6. Depression, stable. 7. Anxiety, stable.  Continue the patient on home medications. 8. Tobacco abuse.  Encouraged smoking cessation.  Patient was     instructed that she can get nicotine patches and nicotine gum over     the counter.   Patient indicated that she will strongly quit     smoking. 9. GERD.  Continue the patient on PPI. 10.Mild transaminitis.  Etiology unclear, maybe due to statin.  We     will continue statin for now.  We will defer to her primary care     physician for further care management. 11.Cocaine abuse. Encouraged cessation. Patient reported that she     used cocaine one day prior to admission.  DISPOSITION AND FOLLOWUP:  Patient to follow up with Dr. Duanne Guess, her primary care physician in few weeks.  Dr. Duanne Guess to consider getting a follow-up chest x-ray to observe resolution of her pneumonia.  Time spent on discharge, talking to the patient and family and coordinating care, was 35 minutes.     Andreas Blower, MD     SR/MEDQ  D:  08/07/2011  T:  08/08/2011  Job:  [id number]  cc:   Maryelizabeth Rowan, M.D. Fax: [phone]  Electronically Signed by Wardell Heath Hanya Guerin  on 08/08/2011 09:11:09 PM

## 2011-09-05 LAB — CBC
HCT: 37.2
Hemoglobin: 13
MCHC: 34.9
RDW: 12.9

## 2011-09-05 LAB — TSH: TSH: 2.178

## 2011-09-05 LAB — LIPID PANEL
Cholesterol: 256 — ABNORMAL HIGH
HDL: 45

## 2011-09-05 LAB — URINALYSIS, ROUTINE W REFLEX MICROSCOPIC
Bilirubin Urine: NEGATIVE
Bilirubin Urine: NEGATIVE
Glucose, UA: NEGATIVE
Hgb urine dipstick: NEGATIVE
Ketones, ur: NEGATIVE
Ketones, ur: NEGATIVE
Nitrite: NEGATIVE
Protein, ur: NEGATIVE
Specific Gravity, Urine: 1.018
Urobilinogen, UA: 0.2
pH: 7

## 2011-09-05 LAB — COMPREHENSIVE METABOLIC PANEL
Alkaline Phosphatase: 81
BUN: 10
Calcium: 8.7
GFR calc non Af Amer: >60
Glucose, Bld: 89
Total Protein: 5.7 — ABNORMAL LOW

## 2011-09-05 LAB — BARBITURATE, URINE, CONFIRMATION
Amobarbital UR Quant: NEGATIVE
Butabarbital UR Quant: NEGATIVE
Butabarbital UR Quant: NEGATIVE
Butalbital UR Quant: 600 ng/mL
Pentobarbital GC/MS Conf: NEGATIVE
Pentobarbital GC/MS Conf: NEGATIVE
Secobarbital GC/MS Conf: NEGATIVE

## 2011-09-05 LAB — URINE DRUGS OF ABUSE SCREEN W ALC, ROUTINE (REF LAB)
Benzodiazepines.: NEGATIVE
Cocaine Metabolites: NEGATIVE
Ethyl Alcohol: <5
Marijuana Metabolite: NEGATIVE
Opiate Screen, Urine: NEGATIVE
Phencyclidine (PCP): NEGATIVE

## 2011-09-05 LAB — DRUGS OF ABUSE SCREEN W/O ALC, ROUTINE URINE
Amphetamine Screen, Ur: NEGATIVE
Barbiturate Quant, Ur: POSITIVE — AB
Benzodiazepines.: NEGATIVE
Phencyclidine (PCP): NEGATIVE

## 2011-09-24 DIAGNOSIS — M545 Low back pain, unspecified: Secondary | ICD-10-CM | POA: Insufficient documentation

## 2012-01-08 ENCOUNTER — Other Ambulatory Visit: Payer: Self-pay | Admitting: Family Medicine

## 2012-01-08 DIAGNOSIS — D249 Benign neoplasm of unspecified breast: Secondary | ICD-10-CM

## 2012-01-16 ENCOUNTER — Ambulatory Visit
Admission: RE | Admit: 2012-01-16 | Discharge: 2012-01-16 | Disposition: A | Discharge status: Home / Self Care | Payer: Medicare Other | Source: Ambulatory Visit | Attending: Family Medicine | Admitting: Family Medicine

## 2012-01-16 DIAGNOSIS — D249 Benign neoplasm of unspecified breast: Secondary | ICD-10-CM

## 2012-11-28 ENCOUNTER — Emergency Department (HOSPITAL_COMMUNITY)
Admission: EM | Admit: 2012-11-28 | Discharge: 2012-11-28 | Disposition: A | Discharge status: Home / Self Care | Payer: Medicare Other | Attending: Emergency Medicine | Admitting: Emergency Medicine

## 2012-11-28 ENCOUNTER — Encounter (HOSPITAL_COMMUNITY): Payer: Self-pay | Admitting: Emergency Medicine

## 2012-11-28 DIAGNOSIS — F329 Major depressive disorder, single episode, unspecified: Secondary | ICD-10-CM | POA: Insufficient documentation

## 2012-11-28 DIAGNOSIS — G43909 Migraine, unspecified, not intractable, without status migrainosus: Secondary | ICD-10-CM | POA: Insufficient documentation

## 2012-11-28 DIAGNOSIS — G8929 Other chronic pain: Secondary | ICD-10-CM | POA: Insufficient documentation

## 2012-11-28 DIAGNOSIS — E7800 Pure hypercholesterolemia, unspecified: Secondary | ICD-10-CM | POA: Insufficient documentation

## 2012-11-28 DIAGNOSIS — F3289 Other specified depressive episodes: Secondary | ICD-10-CM | POA: Insufficient documentation

## 2012-11-28 DIAGNOSIS — F411 Generalized anxiety disorder: Secondary | ICD-10-CM | POA: Insufficient documentation

## 2012-11-28 DIAGNOSIS — F17200 Nicotine dependence, unspecified, uncomplicated: Secondary | ICD-10-CM | POA: Insufficient documentation

## 2012-11-28 DIAGNOSIS — Z79899 Other long term (current) drug therapy: Secondary | ICD-10-CM | POA: Insufficient documentation

## 2012-11-28 DIAGNOSIS — F319 Bipolar disorder, unspecified: Secondary | ICD-10-CM | POA: Insufficient documentation

## 2012-11-28 DIAGNOSIS — R110 Nausea: Secondary | ICD-10-CM | POA: Insufficient documentation

## 2012-11-28 DIAGNOSIS — I10 Essential (primary) hypertension: Secondary | ICD-10-CM | POA: Insufficient documentation

## 2012-11-28 DIAGNOSIS — G2581 Restless legs syndrome: Secondary | ICD-10-CM | POA: Insufficient documentation

## 2012-11-28 HISTORY — DX: Pure hypercholesterolemia, unspecified: E78.00

## 2012-11-28 HISTORY — DX: Anxiety disorder, unspecified: F41.9

## 2012-11-28 HISTORY — DX: Headache, unspecified: R51.9

## 2012-11-28 HISTORY — DX: Essential (primary) hypertension: I10

## 2012-11-28 HISTORY — DX: Headache: R51

## 2012-11-28 HISTORY — DX: Other chronic pain: G89.29

## 2012-11-28 HISTORY — DX: Depression, unspecified: F32.A

## 2012-11-28 HISTORY — DX: Bipolar disorder, unspecified: F31.9

## 2012-11-28 HISTORY — DX: Major depressive disorder, single episode, unspecified: F32.9

## 2012-11-28 HISTORY — DX: Restless legs syndrome: G25.81

## 2012-11-28 MED ORDER — DIPHENHYDRAMINE HCL 50 MG/ML IJ SOLN
25.0000 mg | Freq: Once | INTRAMUSCULAR | Status: AC
  Administered 2012-11-28: 15:00:00 via INTRAMUSCULAR
  Filled 2012-11-28: qty 1

## 2012-11-28 MED ORDER — PROCHLORPERAZINE EDISYLATE 5 MG/ML IJ SOLN
10.0000 mg | Freq: Once | INTRAMUSCULAR | Status: AC
  Administered 2012-11-28: 10 mg via INTRAMUSCULAR
  Filled 2012-11-28: qty 2

## 2012-11-28 MED ORDER — KETOROLAC TROMETHAMINE 60 MG/2ML IM SOLN
60.0000 mg | Freq: Once | INTRAMUSCULAR | Status: AC
  Administered 2012-11-28: 60 mg via INTRAMUSCULAR
  Filled 2012-11-28: qty 2

## 2012-11-28 NOTE — ED Notes (Signed)
Patient reports she has a headache once a week for >10years. Does not know when the last time she has seen a neurologist.

## 2012-11-28 NOTE — ED Notes (Addendum)
Patient states that she has a generalized HA since last pm. The patient also reports Nausea. Patient is crying and very anxious in triage

## 2012-11-28 NOTE — ED Notes (Signed)
Bed:WHALA<BR> Expected date:<BR> Expected time:<BR> Means of arrival:<BR> Comments:<BR> [name] Hinton

## 2012-11-28 NOTE — ED Provider Notes (Signed)
History     CSN: [id number]  Arrival date & time 11/28/12  1307   First MD Initiated Contact with Patient 11/28/12 1410      Chief Complaint  Patient presents with  . Headache  . Nausea    (Consider location/radiation/quality/duration/timing/severity/associated sxs/prior treatment) HPI Comments: Patient comes to the ER for evaluation of severe  headache. Patient has history of chronic migraines. She has a headache that started as bifrontal throbbing is now global and generalized. She has nausea without vomiting. Headache is worsened by bright lights and loud noises. This is typical for her chronic migraines. There no unusual features.  Patient is a 51 y.o. female presenting with headaches.  Headache  Associated symptoms include nausea.    Past Medical History  Diagnosis Date  . Hypertension   . Chronic headaches   . Hypercholesteremia   . Depression   . Bipolar affective   . Restless leg syndrome   . Anxiety     Past Surgical History  Procedure Date  . Abdominal hysterectomy     History reviewed. No pertinent family history.  History  Substance Use Topics  . Smoking status: Current Some Day Smoker  . Smokeless tobacco: Not on file  . Alcohol Use: No    OB History    Grav Para Term Preterm Abortions TAB SAB Ect Mult Living                  Review of Systems  Gastrointestinal: Positive for nausea.  Neurological: Positive for headaches.  All other systems reviewed and are negative.    Allergies  Review of patient's allergies indicates no known allergies.  Home Medications   Current Outpatient Rx  Name  Route  Sig  Dispense  Refill  . ACETAMINOPHEN 500 MG PO TABS   Oral   Take 1,000 mg by mouth every 6 (six) hours as needed.         . BUPROPION HCL ER (XL) 150 MG PO TB24   Oral   Take 150 mg by mouth every morning.         Marland Kitchen CLONAZEPAM 1 MG PO TABS   Oral   Take 1 mg by mouth 2 (two) times daily.         Marland Kitchen LAMOTRIGINE 150 MG PO TABS   Oral   Take 150 mg by mouth every morning.          Marland Kitchen LOSARTAN POTASSIUM-HCTZ 50-12.5 MG PO TABS   Oral   Take 1 tablet by mouth every morning.          Marland Kitchen METOPROLOL SUCCINATE ER 100 MG PO TB24   Oral   Take 100 mg by mouth every morning. Take with or immediately following a meal.         . ADULT MULTIVITAMIN W/MINERALS CH   Oral   Take 1 tablet by mouth every morning.         Marland Kitchen OMEPRAZOLE 40 MG PO CPDR   Oral   Take 40 mg by mouth every morning.          Marland Kitchen PRAVASTATIN SODIUM 40 MG PO TABS   Oral   Take 40 mg by mouth at bedtime.         Marland Kitchen ROPINIROLE HCL 0.25 MG PO TABS   Oral   Take 0.25 mg by mouth every evening. At 4pm         . SOLIFENACIN SUCCINATE 5 MG PO TABS   Oral   Take  10 mg by mouth every morning.          . TRAZODONE HCL 100 MG PO TABS   Oral   Take 300 mg by mouth at bedtime.           BP 165/107  Pulse 99  Temp 97.8 F (36.6 C) (Oral)  Resp 18  SpO2 98%  Physical Exam  Constitutional: She is oriented to person, place, and time. She appears well-developed and well-nourished. She appears distressed.       Patient is distressed, tearful, holding the blanket over her head  HENT:  Head: Normocephalic and atraumatic.  Right Ear: Hearing normal.  Nose: Nose normal.  Mouth/Throat: Oropharynx is clear and moist and mucous membranes are normal.  Eyes: Conjunctivae normal and EOM are normal. Pupils are equal, round, and reactive to light.  Neck: Normal range of motion. Neck supple.  Cardiovascular: Normal rate, regular rhythm, S1 normal and S2 normal.  Exam reveals no gallop and no friction rub.   No murmur heard. Pulmonary/Chest: Effort normal and breath sounds normal. No respiratory distress. She exhibits no tenderness.  Abdominal: Soft. Normal appearance and bowel sounds are normal. There is no hepatosplenomegaly. There is no tenderness. There is no rebound, no guarding, no tenderness at McBurney's point and negative Murphy's sign. No  hernia.  Musculoskeletal: Normal range of motion.  Neurological: She is alert and oriented to person, place, and time. She has normal strength. No cranial nerve deficit or sensory deficit. Coordination normal. GCS eye subscore is 4. GCS verbal subscore is 5. GCS motor subscore is 6.  Skin: Skin is warm, dry and intact. No rash noted. No cyanosis.  Psychiatric: She has a normal mood and affect. Her speech is normal and behavior is normal. Thought content normal.    ED Course  Procedures (including critical care time)  Labs Reviewed - No data to display No results found.   1. Migraine       MDM  Patient presents to ER with complaints of a migraine headache. Patient has a history of chronic recurring migraine headaches. Although she is in a lot of distress, tearful and describes the headache as severe, this is common for her headaches according to her. There is nothing unusual about this headache compared to previous migraines. She therefore does not require imaging or workup for the headache. Treatment for migraine initiated.        Gilda Crease, MD 11/28/12 [phone]

## 2012-11-28 NOTE — ED Notes (Signed)
[id number]<NG> Expected date:<BR> Expected time:<BR> Means of arrival:<BR> Comments:<BR> [place] in room

## 2012-12-15 ENCOUNTER — Other Ambulatory Visit: Payer: Self-pay | Admitting: Family Medicine

## 2012-12-15 DIAGNOSIS — Z780 Asymptomatic menopausal state: Secondary | ICD-10-CM

## 2012-12-15 DIAGNOSIS — N6009 Solitary cyst of unspecified breast: Secondary | ICD-10-CM

## 2013-01-17 ENCOUNTER — Ambulatory Visit
Admission: RE | Admit: 2013-01-17 | Discharge: 2013-01-17 | Disposition: A | Discharge status: Home / Self Care | Payer: Medicare Other | Source: Ambulatory Visit | Attending: Family Medicine | Admitting: Family Medicine

## 2013-01-17 DIAGNOSIS — N6009 Solitary cyst of unspecified breast: Secondary | ICD-10-CM

## 2013-01-17 DIAGNOSIS — Z780 Asymptomatic menopausal state: Secondary | ICD-10-CM

## 2013-05-14 ENCOUNTER — Emergency Department (HOSPITAL_COMMUNITY)
Admission: EM | Admit: 2013-05-14 | Discharge: 2013-05-14 | Disposition: A | Discharge status: Home / Self Care | Payer: Medicare Other | Attending: Emergency Medicine | Admitting: Emergency Medicine

## 2013-05-14 ENCOUNTER — Encounter (HOSPITAL_COMMUNITY): Payer: Self-pay | Admitting: Emergency Medicine

## 2013-05-14 ENCOUNTER — Emergency Department (HOSPITAL_COMMUNITY): Payer: Medicare Other

## 2013-05-14 DIAGNOSIS — Z79899 Other long term (current) drug therapy: Secondary | ICD-10-CM | POA: Insufficient documentation

## 2013-05-14 DIAGNOSIS — F17200 Nicotine dependence, unspecified, uncomplicated: Secondary | ICD-10-CM | POA: Insufficient documentation

## 2013-05-14 DIAGNOSIS — J40 Bronchitis, not specified as acute or chronic: Secondary | ICD-10-CM | POA: Insufficient documentation

## 2013-05-14 DIAGNOSIS — R05 Cough: Secondary | ICD-10-CM | POA: Insufficient documentation

## 2013-05-14 DIAGNOSIS — I10 Essential (primary) hypertension: Secondary | ICD-10-CM | POA: Insufficient documentation

## 2013-05-14 DIAGNOSIS — G8929 Other chronic pain: Secondary | ICD-10-CM | POA: Insufficient documentation

## 2013-05-14 DIAGNOSIS — F411 Generalized anxiety disorder: Secondary | ICD-10-CM | POA: Insufficient documentation

## 2013-05-14 DIAGNOSIS — E7800 Pure hypercholesterolemia, unspecified: Secondary | ICD-10-CM | POA: Insufficient documentation

## 2013-05-14 DIAGNOSIS — F319 Bipolar disorder, unspecified: Secondary | ICD-10-CM | POA: Insufficient documentation

## 2013-05-14 DIAGNOSIS — G2581 Restless legs syndrome: Secondary | ICD-10-CM | POA: Insufficient documentation

## 2013-05-14 DIAGNOSIS — R0789 Other chest pain: Secondary | ICD-10-CM | POA: Insufficient documentation

## 2013-05-14 DIAGNOSIS — R059 Cough, unspecified: Secondary | ICD-10-CM | POA: Insufficient documentation

## 2013-05-14 LAB — BASIC METABOLIC PANEL
BUN: 16 mg/dL (ref 6–23)
CO2: 25 mEq/L (ref 19–32)
Calcium: 9.2 mg/dL (ref 8.4–10.5)
Creatinine, Ser: 0.69 mg/dL (ref 0.50–1.10)
Glucose, Bld: 78 mg/dL (ref 70–99)

## 2013-05-14 LAB — CBC WITH DIFFERENTIAL/PLATELET
Eosinophils Relative: 1 % (ref 0–5)
HCT: 36.1 % (ref 36.0–46.0)
Lymphocytes Relative: 31 % (ref 12–46)
Lymphs Abs: 2.4 10*3/uL (ref 0.7–4.0)
MCH: 30.5 pg (ref 26.0–34.0)
MCV: 87.4 fL (ref 78.0–100.0)
Monocytes Absolute: 0.6 10*3/uL (ref 0.1–1.0)
Monocytes Relative: 7 % (ref 3–12)
RBC: 4.13 MIL/uL (ref 3.87–5.11)
WBC: 7.7 10*3/uL (ref 4.0–10.5)

## 2013-05-14 MED ORDER — ALBUTEROL SULFATE HFA 108 (90 BASE) MCG/ACT IN AERS: 1.0000 | INHALATION_SPRAY | Freq: Four times a day (QID) | RESPIRATORY_TRACT | Status: DC | PRN

## 2013-05-14 MED ORDER — ALBUTEROL SULFATE (5 MG/ML) 0.5% IN NEBU
5.0000 mg | INHALATION_SOLUTION | Freq: Once | RESPIRATORY_TRACT | Status: AC
  Administered 2013-05-14: 5 mg via RESPIRATORY_TRACT
  Filled 2013-05-14: qty 1

## 2013-05-14 MED ORDER — ALBUTEROL SULFATE (5 MG/ML) 0.5% IN NEBU
5.0000 mg | INHALATION_SOLUTION | Freq: Once | RESPIRATORY_TRACT | Status: AC
  Administered 2013-05-14: 5 mg via RESPIRATORY_TRACT

## 2013-05-14 NOTE — ED Notes (Signed)
Pt from home reports SOB, non-productive cough, and chest tightness. Pt reports that she felt this way when she had PNA 2 years ago. Pt denies N/V/D or fever, no dizziness, weakness or back pain. Pt is A&O and in NAD

## 2013-05-14 NOTE — ED Provider Notes (Signed)
History     CSN: [id number]  Arrival date & time 05/14/13  4098   First MD Initiated Contact with Patient 05/14/13 1832      Chief Complaint  Patient presents with  . Shortness of Breath  . Chest Pain    (Consider location/radiation/quality/duration/timing/severity/associated sxs/prior treatment) The history is provided by the patient.  [name] Hinton is a 52 y.o. female hx of HTN, depression here with SOB, cough. She recently started smoking again due to stress. She has SOB and nonproductive cough since yesterday. Also some chest tightness. Denies chest pain with movement. No leg swelling and no history of PE/DVT. She had similar symptoms 2 years ago and was diagnosed with pneumonia. Denies MI or cardiac stents in the past.     Past Medical History  Diagnosis Date  . Hypertension   . Chronic headaches   . Hypercholesteremia   . Depression   . Bipolar affective   . Restless leg syndrome   . Anxiety     Past Surgical History  Procedure Laterality Date  . Abdominal hysterectomy    . Tonsillectomy    . Oophorectomy      No family history on file.  History  Substance Use Topics  . Smoking status: Current Some Day Smoker  . Smokeless tobacco: Not on file  . Alcohol Use: No    OB History   Grav Para Term Preterm Abortions TAB SAB Ect Mult Living                  Review of Systems  Respiratory: Positive for shortness of breath.   Cardiovascular: Positive for chest pain.  All other systems reviewed and are negative.    Allergies  Review of patient's allergies indicates no known allergies.  Home Medications   Current Outpatient Rx  Name  Route  Sig  Dispense  Refill  . b complex vitamins tablet   Oral   Take 1 tablet by mouth daily.         Marland Kitchen buPROPion (WELLBUTRIN XL) 150 MG 24 hr tablet   Oral   Take 150 mg by mouth every morning.         . calcium carbonate (OS-CAL - DOSED IN MG OF ELEMENTAL CALCIUM) 1250 MG tablet   Oral   Take 1 tablet by  mouth daily.         . cholecalciferol (VITAMIN D) 1000 UNITS tablet   Oral   Take 1,000 Units by mouth daily.         . clonazePAM (KLONOPIN) 1 MG tablet   Oral   Take 1 mg by mouth 2 (two) times daily.         . fish oil-omega-3 fatty acids 1000 MG capsule   Oral   Take 1 g by mouth daily.         Marland Kitchen gabapentin (NEURONTIN) 300 MG capsule   Oral   Take 300 mg by mouth 3 (three) times daily.         Marland Kitchen lamoTRIgine (LAMICTAL) 150 MG tablet   Oral   Take 150 mg by mouth every morning.          Marland Kitchen losartan-hydrochlorothiazide (HYZAAR) 50-12.5 MG per tablet   Oral   Take 1 tablet by mouth every morning.          . magnesium oxide (MAG-OX) 400 MG tablet   Oral   Take 400 mg by mouth daily.         Marland Kitchen  metoprolol succinate (TOPROL-XL) 100 MG 24 hr tablet   Oral   Take 100 mg by mouth every morning. Take with or immediately following a meal.         . Multiple Vitamin (MULTIVITAMIN WITH MINERALS) TABS   Oral   Take 1 tablet by mouth every morning.         Marland Kitchen omeprazole (PRILOSEC) 40 MG capsule   Oral   Take 40 mg by mouth every morning.          . pravastatin (PRAVACHOL) 40 MG tablet   Oral   Take 40 mg by mouth at bedtime.         Marland Kitchen QUEtiapine (SEROQUEL) 200 MG tablet   Oral   Take 200 mg by mouth at bedtime.         Marland Kitchen rOPINIRole (REQUIP) 0.25 MG tablet   Oral   Take 0.25 mg by mouth at bedtime. At 4pm         . traZODone (DESYREL) 100 MG tablet   Oral   Take 300 mg by mouth at bedtime.         Marland Kitchen acetaminophen (TYLENOL) 500 MG tablet   Oral   Take 1,000 mg by mouth every 6 (six) hours as needed.           BP 150/84  Pulse 57  Temp(Src) 98.3 F (36.8 C) (Oral)  Resp 16  Ht 5\' 6"  (1.676 m)  Wt 160 lb (72.576 kg)  BMI 25.84 kg/m2  SpO2 99%  Physical Exam  Nursing note and vitals reviewed. Constitutional: She is oriented to person, place, and time. She appears well-developed and well-nourished.  Comfortable   HENT:  Head:  Normocephalic.  Mouth/Throat: Oropharynx is clear and moist.  Eyes: Conjunctivae are normal. Pupils are equal, round, and reactive to light.  Neck: Normal range of motion. Neck supple.  Cardiovascular: Normal rate, regular rhythm and normal heart sounds.   Pulmonary/Chest: Effort normal.  Decreased breath sounds bilaterally. No wheezing but only moderate air movement   Abdominal: Soft. Bowel sounds are normal. She exhibits no distension. There is no tenderness. There is no rebound and no guarding.  Musculoskeletal: Normal range of motion. She exhibits no edema and no tenderness.  Neurological: She is alert and oriented to person, place, and time.  Skin: Skin is warm and dry.  Psychiatric: She has a normal mood and affect. Her behavior is normal. Thought content normal.    ED Course  Procedures (including critical care time)  Labs Reviewed  CBC WITH DIFFERENTIAL  BASIC METABOLIC PANEL  TROPONIN I   Dg Chest 2 View  05/14/2013   *RADIOLOGY REPORT*  Clinical Data: Shortness of breath.  CHEST - 2 VIEW  Comparison: 08/03/2011.  Findings: Mild central pulmonary vascular prominence. No infiltrate, congestive heart failure or pneumothorax.  Aorta is slightly tortuous.  Heart size within normal limits.  Slight increased markings left cardiac region may represent confluence of shadows.  Attention to this on follow-up.  IMPRESSION: Mild central pulmonary vascular prominence. No infiltrate, congestive heart failure or pneumothorax.  Slight increased markings left cardiac region may represent confluence of shadows.  Attention to this on follow-up.   Original Report Authenticated By: Lacy Duverney, M.D.     No diagnosis found.   Date: 05/14/2013  Rate: 58  Rhythm: sinus arrhythmia  QRS Axis: normal  Intervals: PR prolonged  ST/T Wave abnormalities: normal  Conduction Disutrbances:none  Narrative Interpretation:   Old EKG Reviewed: unchanged  MDM  [name] Hinton is a 52 y.o. female here  with SOB, cough. Likely bronchitis given smoking history. Low risk for ACS and symptoms for a day so trop x 1 sufficient. Not concerned for PE. Will get labs, CXR and give nebs and reassess.   8:23 PM CXR showed no pneumonia, likely bronchitis. Labs and trop nl and symptoms for a day. Felt better after nebs. Will d/c home on albuterol prn and recommend smoking cessation.          Richardean Canal, MD 05/14/13 2023

## 2013-05-20 DIAGNOSIS — D242 Benign neoplasm of left breast: Secondary | ICD-10-CM | POA: Insufficient documentation

## 2014-03-27 ENCOUNTER — Other Ambulatory Visit: Payer: Self-pay

## 2014-03-27 DIAGNOSIS — Z1231 Encounter for screening mammogram for malignant neoplasm of breast: Secondary | ICD-10-CM

## 2014-04-03 ENCOUNTER — Ambulatory Visit: Payer: Medicare Other

## 2014-04-12 ENCOUNTER — Ambulatory Visit: Payer: Medicare Other

## 2014-05-19 ENCOUNTER — Ambulatory Visit: Payer: Medicare Other

## 2014-10-05 ENCOUNTER — Ambulatory Visit: Payer: Medicare Other

## 2014-10-19 ENCOUNTER — Ambulatory Visit: Payer: Medicare Other

## 2014-11-07 ENCOUNTER — Ambulatory Visit: Payer: Medicare Other

## 2014-11-30 ENCOUNTER — Ambulatory Visit: Payer: Medicare Other

## 2014-12-08 ENCOUNTER — Ambulatory Visit: Payer: Medicare Other

## 2014-12-13 ENCOUNTER — Ambulatory Visit: Payer: Medicaid Other

## 2014-12-19 ENCOUNTER — Other Ambulatory Visit: Payer: Self-pay

## 2014-12-19 DIAGNOSIS — M79621 Pain in right upper arm: Secondary | ICD-10-CM

## 2015-04-11 ENCOUNTER — Emergency Department (HOSPITAL_COMMUNITY): Payer: Medicare Other

## 2015-04-11 ENCOUNTER — Emergency Department (HOSPITAL_COMMUNITY)
Admission: EM | Admit: 2015-04-11 | Discharge: 2015-04-11 | Disposition: A | Discharge status: Home / Self Care | Payer: Medicare Other | Attending: Emergency Medicine | Admitting: Emergency Medicine

## 2015-04-11 ENCOUNTER — Encounter (HOSPITAL_COMMUNITY): Payer: Self-pay | Admitting: Emergency Medicine

## 2015-04-11 DIAGNOSIS — E780 Pure hypercholesterolemia: Secondary | ICD-10-CM | POA: Insufficient documentation

## 2015-04-11 DIAGNOSIS — Y9389 Activity, other specified: Secondary | ICD-10-CM | POA: Diagnosis not present

## 2015-04-11 DIAGNOSIS — F419 Anxiety disorder, unspecified: Secondary | ICD-10-CM | POA: Diagnosis not present

## 2015-04-11 DIAGNOSIS — Y998 Other external cause status: Secondary | ICD-10-CM | POA: Insufficient documentation

## 2015-04-11 DIAGNOSIS — Z720 Tobacco use: Secondary | ICD-10-CM | POA: Diagnosis not present

## 2015-04-11 DIAGNOSIS — Z79899 Other long term (current) drug therapy: Secondary | ICD-10-CM | POA: Diagnosis not present

## 2015-04-11 DIAGNOSIS — I10 Essential (primary) hypertension: Secondary | ICD-10-CM | POA: Insufficient documentation

## 2015-04-11 DIAGNOSIS — G2581 Restless legs syndrome: Secondary | ICD-10-CM | POA: Diagnosis not present

## 2015-04-11 DIAGNOSIS — Y92410 Unspecified street and highway as the place of occurrence of the external cause: Secondary | ICD-10-CM | POA: Diagnosis not present

## 2015-04-11 DIAGNOSIS — F319 Bipolar disorder, unspecified: Secondary | ICD-10-CM | POA: Insufficient documentation

## 2015-04-11 DIAGNOSIS — S299XXA Unspecified injury of thorax, initial encounter: Secondary | ICD-10-CM | POA: Insufficient documentation

## 2015-04-11 DIAGNOSIS — S199XXA Unspecified injury of neck, initial encounter: Secondary | ICD-10-CM | POA: Diagnosis not present

## 2015-04-11 DIAGNOSIS — R0789 Other chest pain: Secondary | ICD-10-CM

## 2015-04-11 MED ORDER — HYDROCODONE-ACETAMINOPHEN 5-325 MG PO TABS: 2.0000 | ORAL_TABLET | ORAL | Status: DC | PRN

## 2015-04-11 MED ORDER — OXYCODONE-ACETAMINOPHEN 5-325 MG PO TABS
2.0000 | ORAL_TABLET | Freq: Once | ORAL | Status: AC
  Administered 2015-04-11: 2 via ORAL
  Filled 2015-04-11: qty 2
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
## 2015-07-06 ENCOUNTER — Other Ambulatory Visit: Payer: Self-pay | Admitting: Internal Medicine

## 2015-07-06 DIAGNOSIS — Z1231 Encounter for screening mammogram for malignant neoplasm of breast: Secondary | ICD-10-CM

## 2015-07-27 ENCOUNTER — Ambulatory Visit: Payer: Medicare Other

## 2015-09-07 ENCOUNTER — Ambulatory Visit: Payer: Medicare Other

## 2015-09-18 ENCOUNTER — Ambulatory Visit: Payer: Medicare Other

## 2016-03-04 DIAGNOSIS — F339 Major depressive disorder, recurrent, unspecified: Secondary | ICD-10-CM | POA: Insufficient documentation

## 2016-10-06 ENCOUNTER — Other Ambulatory Visit (HOSPITAL_COMMUNITY): Payer: Self-pay | Admitting: Gastroenterology

## 2016-10-06 DIAGNOSIS — R1013 Epigastric pain: Secondary | ICD-10-CM

## 2016-10-13 ENCOUNTER — Ambulatory Visit (HOSPITAL_COMMUNITY)
Admission: RE | Admit: 2016-10-13 | Discharge: 2016-10-13 | Disposition: A | Discharge status: Home / Self Care | Payer: Medicare Other | Source: Ambulatory Visit | Attending: Gastroenterology | Admitting: Gastroenterology

## 2016-10-13 DIAGNOSIS — N289 Disorder of kidney and ureter, unspecified: Secondary | ICD-10-CM | POA: Insufficient documentation

## 2016-10-13 DIAGNOSIS — R1013 Epigastric pain: Secondary | ICD-10-CM | POA: Insufficient documentation

## 2017-07-28 ENCOUNTER — Ambulatory Visit: Payer: Medicare Other | Admitting: Physical Therapy

## 2017-08-05 ENCOUNTER — Ambulatory Visit: Payer: Medicare Other

## 2017-08-06 ENCOUNTER — Encounter (HOSPITAL_COMMUNITY): Payer: Self-pay | Admitting: Emergency Medicine

## 2017-08-06 ENCOUNTER — Emergency Department (HOSPITAL_COMMUNITY)
Admission: EM | Admit: 2017-08-06 | Discharge: 2017-08-06 | Discharge status: Against Medical Advice | Payer: Medicare Other | Attending: Emergency Medicine | Admitting: Emergency Medicine

## 2017-08-06 ENCOUNTER — Ambulatory Visit: Payer: Medicare Other | Admitting: Physical Therapy

## 2017-08-06 DIAGNOSIS — I10 Essential (primary) hypertension: Secondary | ICD-10-CM | POA: Diagnosis not present

## 2017-08-06 DIAGNOSIS — Y939 Activity, unspecified: Secondary | ICD-10-CM | POA: Diagnosis not present

## 2017-08-06 DIAGNOSIS — Y92410 Unspecified street and highway as the place of occurrence of the external cause: Secondary | ICD-10-CM | POA: Diagnosis not present

## 2017-08-06 DIAGNOSIS — F17200 Nicotine dependence, unspecified, uncomplicated: Secondary | ICD-10-CM | POA: Insufficient documentation

## 2017-08-06 DIAGNOSIS — M545 Low back pain: Secondary | ICD-10-CM | POA: Diagnosis present

## 2017-08-06 DIAGNOSIS — Z79899 Other long term (current) drug therapy: Secondary | ICD-10-CM | POA: Diagnosis not present

## 2017-08-06 DIAGNOSIS — Y999 Unspecified external cause status: Secondary | ICD-10-CM | POA: Diagnosis not present

## 2017-08-06 MED ORDER — CYCLOBENZAPRINE HCL 10 MG PO TABS: 10.0000 mg | ORAL_TABLET | Freq: Once | ORAL | Status: DC

## 2017-08-06 NOTE — ED Notes (Signed)
Made PA aware of elopement

## 2017-08-06 NOTE — ED Notes (Addendum)
Pt walked out of the ED. Family reports she was upset and left
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
## 2017-08-18 ENCOUNTER — Ambulatory Visit: Payer: Medicare Other | Attending: Chiropractic Medicine | Admitting: Physical Therapy

## 2017-08-18 ENCOUNTER — Encounter: Payer: Self-pay | Admitting: Physical Therapy

## 2017-08-18 DIAGNOSIS — G8929 Other chronic pain: Secondary | ICD-10-CM | POA: Diagnosis present

## 2017-08-18 DIAGNOSIS — M545 Low back pain: Secondary | ICD-10-CM | POA: Diagnosis present

## 2017-08-18 DIAGNOSIS — R250 Abnormal head movements: Secondary | ICD-10-CM | POA: Diagnosis present

## 2017-08-18 DIAGNOSIS — R293 Abnormal posture: Secondary | ICD-10-CM | POA: Diagnosis present

## 2017-08-18 DIAGNOSIS — M6281 Muscle weakness (generalized): Secondary | ICD-10-CM | POA: Insufficient documentation

## 2017-08-18 NOTE — Therapy (Signed)
[facility] [address], [place], [place], [address] Phone: [phone]   Fax:  [phone]  Physical Therapy Evaluation  Patient Details  Name: [name] Hinton MRN: [id number] Date of Birth: 1961-05-06 Referring Provider: Levy Pupa, PA-C  Encounter Date: 08/18/2017      PT End of Session - 08/18/17 1532    Visit Number 1   Number of Visits 13   Date for PT Re-Evaluation 09/17/17   Authorization Type Medicare and Medicaid    Authorization Time Period 08/18/16 to 09/29/17   PT Start Time 1446   PT Stop Time 1528   PT Time Calculation (min) 42 min   Activity Tolerance Patient tolerated treatment well;No increased pain   Behavior During Therapy WFL for tasks assessed/performed      Past Medical History:  Diagnosis Date  . Anxiety   . Bipolar affective ([place])   . Chronic headaches   . Depression   . Hypercholesteremia   . Hypertension   . Restless leg syndrome     Past Surgical History:  Procedure Laterality Date  . ABDOMINAL HYSTERECTOMY    . OOPHORECTOMY    . TONSILLECTOMY      There were no vitals filed for this visit.       Subjective Assessment - 08/18/17 1449    Subjective Pt reports having problems with her back for as long as she can remember. She feels that it started after she had a hysterectomy and hasn't improved. She has had injections without success. Pt reports that she originally went back to the orthopedic MD because the inside of her leg was very painful. She also had some loss of sensation. Following this, she went to [facility] and returned without any symptoms down the leg.    Pertinent History HTN, anxiety, bipolar, depression    Limitations Sitting;House hold activities   How long can you sit comfortably? 30 minutes    Diagnostic tests Xray   Patient Stated Goals be able to walk atleast a mile, vaccum without as much pain    Currently in Pain? No/denies            [facility]  PT Assessment - 08/18/17 0001      Assessment   Medical Diagnosis Lumbar Radiculopathy    Referring Provider Levy Pupa, PA-C   Onset Date/Surgical Date --  several years ago    Next MD Visit none    Prior Therapy not sure how long ago      Balance Screen   Has the patient fallen in the past 6 months No   Has the patient had a decrease in activity level because of a fear of falling?  No   Is the patient reluctant to leave their home because of a fear of falling?  No     Home Ecologist residence     Prior Function   Vocation On disability   Leisure caring for her parents     Observation/Other Assessments   Observations sitting with rounded shoulders and forward head      Sensation   Light Touch Appears Intact   Additional Comments no reports of numbness and tingling      ROM / Strength   AROM / PROM / Strength AROM;Strength     AROM   AROM Assessment Site Lumbar   Lumbar Flexion Lt thoracic convexity noted, pain end range    Lumbar Extension WNL, pain end range  Lumbar - Right Side Bend WNL, pain Rt low back    Lumbar - Left Side Bend WNL, pain Rt low back (pulling)      Strength   Overall Strength Comments BLE leg lower to 75 deg   Strength Assessment Site Hip;Knee;Ankle   Right/Left Hip Right;Left   Right Hip Flexion 5/5   Right Hip Extension 5/5   Right Hip ABduction 4/5   Left Hip Flexion 4+/5   Left Hip Extension 5/5   Left Hip ABduction 4-/5   Right/Left Knee Right;Left   Right Knee Flexion 5/5   Right Knee Extension 5/5   Left Knee Flexion 4/5   Left Knee Extension 5/5   Right/Left Ankle Right;Left   Right Ankle Dorsiflexion 5/5   Left Ankle Dorsiflexion 5/5     Flexibility   Soft Tissue Assessment /Muscle Length yes   Hamstrings WNL   Piriformis WNL     Palpation   Palpation comment Tenderness along lumbar paraspinals and QL on the Rt     Transfers   Five time sit to stand comments  9.8 sec, UE on thighs and  crashing into chair             Objective measurements completed on examination: See above findings.          [place] Adult PT Treatment/Exercise - 08/18/17 0001      Exercises   Exercises Lumbar     Lumbar Exercises: Supine   Other Supine Lumbar Exercises low trunk rotation x5 reps each for HEP demo                 PT Education - 08/18/17 1529    Education provided Yes   Education Details eval findings/POC; benefits of regular activity and exercise in improving flexibility and tolerance to daily activity; implemented and reviewed HEP; importance of improving core strength and stability in preventing worsening of scolosis down the road    Person(s) Educated Patient   Methods Explanation;Verbal cues;Handout   Comprehension Verbalized understanding;Returned demonstration          PT Short Term Goals - 08/18/17 1541      PT SHORT TERM GOAL #1   Title Pt will demo consistency and independence with her HEP to improve pain and flexibility.    Time 3   Period Weeks   Status New   Target Date 09/08/17           PT Long Term Goals - 08/18/17 1541      PT LONG TERM GOAL #1   Title Pt will demo improved BLE strength to 5/5 MMT which will improve her safety with daily activity.    Time 6   Period Weeks   Status New   Target Date 09/29/17     PT LONG TERM GOAL #2   Title Pt will demo improved trunk endurance/strength evident by her ability to complete BLE leg lower to no higher than 45 deg from the table with proper abdominal activation.    Time 6   Period Weeks     PT LONG TERM GOAL #3   Title Pt will report atleast 50% improvement in her symptoms which will increase her activity tolerance and improve her quality of life.    Time 6   Period Weeks   Status New     PT LONG TERM GOAL #4   Title Pt will demo understanding of a regular wellness program, evident by her report of atleast 3 days/week participation in  activity at her local gym.   Time 6    Period Weeks   Status New     PT LONG TERM GOAL #5   Title Pt will report atleast 50% improvement in her ability to complete vacuuming around the house, in order to improve her independence around her home.    Time 6   Period Weeks   Status New                Plan - September 16, 2017 1533    Clinical Impression Statement Pt is a pleasant 56 y.o F referred to OPPT with diagnosis of lumbar radiculopathy. Pt has a history of scoliosis and low back pain for several years and at the time of her MD appointment was complaining of symptoms into the LLE. She presents today without radicular symptoms, however she does have low back pain (Rt>Lt), muscle spasm on the Rt paraspinals and limitations in trunk and LE strength. Pt is currently living a sedentary lifestyle, and reports difficulty with prolonged sitting and housework activities due to her back pain. She would benefit from skilled PT to address her limitations in flexibility, strength and decrease her pain to improve activity tolerance and promote a healthy lifestyle.    History and Personal Factors relevant to plan of care: scoliosis, long history of low back pain    Clinical Presentation Evolving   Clinical Presentation due to: worsening low back pain, recent change in radicular symptoms    Clinical Decision Making Low   Rehab Potential Good   PT Frequency 2x / week   PT Duration 6 weeks   PT Treatment/Interventions ADLs/Self Care Home Management;Cryotherapy;Electrical Stimulation;Moist Heat;Traction;Therapeutic activities;Therapeutic exercise;Patient/family education;Neuromuscular re-education;Manual techniques;Dry needling;Passive range of motion;Taping   PT Next Visit Plan focus on deep abdominal activation and endurance, lumbar AROM, modalities and traction as needed for pain relief    PT Home Exercise Plan supine ab bracing with bent knee raise, supine low trunk rotation, lumbar roll, walking/aerobic activity atleast 3x/week    Consulted  and Agree with Plan of Care Patient      Patient will benefit from skilled therapeutic intervention in order to improve the following deficits and impairments:  Decreased activity tolerance, Improper body mechanics, Pain, Postural dysfunction, Increased muscle spasms, Decreased strength, Impaired flexibility  Visit Diagnosis: Chronic bilateral low back pain, with sciatica presence unspecified  Muscle weakness (generalized)  Abnormal posture  Abnormal head movements      G-Codes - 09/16/2017 1546    Functional Assessment Tool Used (Outpatient Only) FOTO: 44% limited    Functional Limitation Changing and maintaining body position   Changing and Maintaining Body Position Current Status (L9767) At least 40 percent but less than 60 percent impaired, limited or restricted   Changing and Maintaining Body Position Goal Status (H4193) At least 20 percent but less than 40 percent impaired, limited or restricted       Problem List There are no active problems to display for this patient.   3:50 PM,09/16/2017 Elly Modena PT, [place] [facility] [place]  [place] [address] [place], [place], [address] Phone: [phone]   Fax:  [phone]  Name: [name] Hinton MRN: [id number] Date of Birth: Aug 22, 1961

## 2017-08-21 ENCOUNTER — Encounter: Payer: Self-pay | Admitting: Physical Therapy

## 2017-08-21 ENCOUNTER — Ambulatory Visit: Payer: Medicare Other | Admitting: Physical Therapy

## 2017-08-21 DIAGNOSIS — G8929 Other chronic pain: Secondary | ICD-10-CM

## 2017-08-21 DIAGNOSIS — R250 Abnormal head movements: Secondary | ICD-10-CM

## 2017-08-21 DIAGNOSIS — M545 Low back pain: Principal | ICD-10-CM

## 2017-08-21 DIAGNOSIS — M6281 Muscle weakness (generalized): Secondary | ICD-10-CM

## 2017-08-21 DIAGNOSIS — R293 Abnormal posture: Secondary | ICD-10-CM

## 2017-08-21 NOTE — Patient Instructions (Signed)
HEP progression:  -Lay on your back with knees bent. Pillow or ball between knees. Inhale to prepare, then exhale making a little noise to squeeze the ball and gently pull the lower abs in, away from your pants so you feel like you are hugging your spine. Do 6-10x : 1-2 x day  - Then, repeat the above but add the gently squeeze of your glutes. Hold this for 2-3 sec. Do 6-10x 1-2x day.

## 2017-08-21 NOTE — Therapy (Signed)
[facility] [address] [place], [place], [address] Phone: [phone]   Fax:  [phone]  Physical Therapy Treatment  Patient Details  Name: [name] Hinton MRN: [id number] Date of Birth: [birth year] Referring Provider: Levy Pupa, PA-C  Encounter Date: 08/21/2017      PT End of Session - 08/21/17 0807    Visit Number 2   Number of Visits 13   Date for PT Re-Evaluation 09/17/17   Authorization Type Medicare and Medicaid    Authorization Time Period 08/18/16 to 09/29/17   PT Start Time 0802   PT Stop Time 0845   PT Time Calculation (min) 43 min   Activity Tolerance Patient tolerated treatment well;No increased pain   Behavior During Therapy WFL for tasks assessed/performed      Past Medical History:  Diagnosis Date  . Anxiety   . Bipolar affective ([place])   . Chronic headaches   . Depression   . Hypercholesteremia   . Hypertension   . Restless leg syndrome     Past Surgical History:  Procedure Laterality Date  . ABDOMINAL HYSTERECTOMY    . OOPHORECTOMY    . TONSILLECTOMY      There were no vitals filed for this visit.      Subjective Assessment - 08/21/17 0808    Subjective No pain reports this AM.    Currently in Pain? No/denies   Multiple Pain Sites No                         [facility] Adult PT Treatment/Exercise - 08/21/17 0001      Self-Care   Self-Care ADL's;Posture  Concurrent with TE   ADL's Body mechanics for vacuuming and washing dishes that are lumbar protective. PTA demo and pt verbally understood.     Lumbar Exercises: Stretches   Single Knee to Chest Stretch 1 rep;30 seconds  Bil   Lower Trunk Rotation --  Rocking initially then 2x 20 sec bil     Lumbar Exercises: Aerobic   Stationary Bike Nustep L1 x 6 min  PTA reviewed status with pt.     Lumbar Exercises: Standing   Other Standing Lumbar Exercises Hip abduction bil 2x 10 0#   VC for core contraction      Lumbar Exercises: Supine   Ab Set --  TA/ball squeeze with Pilates breath 6x, then added gluteals   Clam 10 reps   Clam Limitations red band used    Bridge --  6x     Lumbar Exercises: Sidelying   Clam --  6x bil VC for correct positioning                  PT Short Term Goals - 08/18/17 1541      PT SHORT TERM GOAL #1   Title Pt will demo consistency and independence with her HEP to improve pain and flexibility.    Time 3   Period Weeks   Status New   Target Date 09/08/17           PT Long Term Goals - 08/18/17 1541      PT LONG TERM GOAL #1   Title Pt will demo improved BLE strength to 5/5 MMT which will improve her safety with daily activity.    Time 6   Period Weeks   Status New   Target Date 09/29/17     PT LONG TERM GOAL #2   Title Pt  will demo improved trunk endurance/strength evident by her ability to complete BLE leg lower to no higher than 45 deg from the table with proper abdominal activation.    Time 6   Period Weeks     PT LONG TERM GOAL #3   Title Pt will report atleast 50% improvement in her symptoms which will increase her activity tolerance and improve her quality of life.    Time 6   Period Weeks   Status New     PT LONG TERM GOAL #4   Title Pt will demo understanding of a regular wellness program, evident by her report of atleast 3 days/week participation in activity at her local gym.   Time 6   Period Weeks   Status New     PT LONG TERM GOAL #5   Title Pt will report atleast 50% improvement in her ability to complete vacuuming around the house, in order to improve her independence around her home.    Time 6   Period Weeks   Status New               Plan - 08/21/17 0300    Clinical Impression Statement Pt appears excited and commited to increasing her exercise regime. She has done some of her stretches given at eval but has not started any walking at this time. During treatment today pt was given a progression to  the HEp to include level 1 core initiation exercises. No pain with any exercises.    Rehab Potential Good   PT Frequency 2x / week   PT Duration 6 weeks   PT Treatment/Interventions ADLs/Self Care Home Management;Cryotherapy;Electrical Stimulation;Moist Heat;Traction;Therapeutic activities;Therapeutic exercise;Patient/family education;Neuromuscular re-education;Manual techniques;Dry needling;Passive range of motion;Taping   PT Next Visit Plan focus on deep abdominal activation and endurance, lumbar AROM, modalities and traction as needed for pain relief    Consulted and Agree with Plan of Care Patient      Patient will benefit from skilled therapeutic intervention in order to improve the following deficits and impairments:  Decreased activity tolerance, Improper body mechanics, Pain, Postural dysfunction, Increased muscle spasms, Decreased strength, Impaired flexibility  Visit Diagnosis: Chronic bilateral low back pain, with sciatica presence unspecified  Muscle weakness (generalized)  Abnormal posture  Abnormal head movements     Problem List There are no active problems to display for this patient.   Saydie Gerdts , PTA 08/21/2017, 8:48 AM  [facility] [address] [place], [place], [address] Phone: [phone]   Fax:  [phone]  Name: CELENA LANIUS MRN: [id number] Date of Birth: [birth year]

## 2017-08-24 ENCOUNTER — Encounter: Payer: Self-pay | Admitting: Physical Therapy

## 2017-08-24 ENCOUNTER — Ambulatory Visit: Payer: Medicare Other | Admitting: Physical Therapy

## 2017-08-24 DIAGNOSIS — M545 Low back pain: Principal | ICD-10-CM

## 2017-08-24 DIAGNOSIS — R293 Abnormal posture: Secondary | ICD-10-CM

## 2017-08-24 DIAGNOSIS — R250 Abnormal head movements: Secondary | ICD-10-CM

## 2017-08-24 DIAGNOSIS — M6281 Muscle weakness (generalized): Secondary | ICD-10-CM

## 2017-08-24 DIAGNOSIS — G8929 Other chronic pain: Secondary | ICD-10-CM

## 2017-08-24 NOTE — Therapy (Addendum)
[facility] [address] [place], [place], [address] Phone: [phone]   Fax:  [phone]  Physical Therapy Treatment/Discharge  Patient Details  Name: MARLIS OLDAKER MRN: [id number] Date of Birth: 12-Mar-1961 Referring Provider: Levy Pupa, PA-C  Encounter Date: 08/24/2017      PT End of Session - 08/24/17 1404    Visit Number 3   Number of Visits 13   Date for PT Re-Evaluation 09/17/17   Authorization Type Medicare and Medicaid    Authorization Time Period 08/18/16 to 09/29/17   PT Start Time 1358   PT Stop Time 1440   PT Time Calculation (min) 42 min   Activity Tolerance Patient tolerated treatment well;No increased pain   Behavior During Therapy WFL for tasks assessed/performed      Past Medical History:  Diagnosis Date  . Anxiety   . Bipolar affective ([place])   . Chronic headaches   . Depression   . Hypercholesteremia   . Hypertension   . Restless leg syndrome     Past Surgical History:  Procedure Laterality Date  . ABDOMINAL HYSTERECTOMY    . OOPHORECTOMY    . TONSILLECTOMY      There were no vitals filed for this visit.      Subjective Assessment - 08/24/17 1405    Subjective Did ok with last session and exercises. Today she has beginnings of a migraine so she wants to go slow.    Currently in Pain? --  No back pain but beginnings of migraine   Multiple Pain Sites No                         [facility] Adult PT Treatment/Exercise - 08/24/17 0001      Lumbar Exercises: Stretches   Single Knee to Chest Stretch 1 rep;30 seconds  Bil, post pelvic tilt then after bridges   Pelvic Tilt --  10x, supine MHP for a target     Lumbar Exercises: Aerobic   Stationary Bike Nustep L1 x 6 min  PTA with status review concurrent     Lumbar Exercises: Standing   Other Standing Lumbar Exercises Hip abduction bil 2x 10x 2# added today  VC for core contraction     Lumbar Exercises:  Supine   Ab Set --  TA/ball squeeze with Pilates breath 6x, then added gluteals   Clam 20 reps   Clam Limitations red band used    Bridge 20 reps;2 seconds  2 sets of 10     Lumbar Exercises: Sidelying   Clam --  10x bil VC for correct positioning                  PT Short Term Goals - 08/24/17 1428      PT SHORT TERM GOAL #1   Title Pt will demo consistency and independence with her HEP to improve pain and flexibility.    Time 3   Period Weeks   Status Achieved           PT Long Term Goals - 08/24/17 1428      PT LONG TERM GOAL #4   Title Pt will demo understanding of a regular wellness program, evident by her report of atleast 3 days/week participation in activity at her local gym.   Time 6   Period Weeks   Status On-going               Plan - 08/24/17  1404    Clinical Impression Statement Pt was on the brink of a migraine so all considerations were made to help her be able to exercise without exacerbation of either low back pain or migraine ( lights low, ice pack to base of skull.) Pt reports compliant with HEP and presented without back today. Increased exercises gently either by adding another set or light resistance due to HA. Has not tried any walking yet.    Rehab Potential Good   PT Frequency 2x / week   PT Duration 6 weeks   PT Treatment/Interventions ADLs/Self Care Home Management;Cryotherapy;Electrical Stimulation;Moist Heat;Traction;Therapeutic activities;Therapeutic exercise;Patient/family education;Neuromuscular re-education;Manual techniques;Dry needling;Passive range of motion;Taping   PT Next Visit Plan focus on deep abdominal activation and endurance, lumbar AROM. Progress HEP to add bridge, standing hip abd,  and SL clams with resistance.    Consulted and Agree with Plan of Care Patient      Patient will benefit from skilled therapeutic intervention in order to improve the following deficits and impairments:  Decreased activity  tolerance, Improper body mechanics, Pain, Postural dysfunction, Increased muscle spasms, Decreased strength, Impaired flexibility  Visit Diagnosis: Chronic bilateral low back pain, with sciatica presence unspecified  Muscle weakness (generalized)  Abnormal posture  Abnormal head movements     Problem List There are no active problems to display for this patient.   Chanika Byland, PTA 08/24/2017, 2:41 PM  [facility] [address] [place], [place], [address] Phone: [phone]   Fax:  [phone]  Name: NATARSHA HURWITZ MRN: [id number] Date of Birth: 10/15/1961   *Addendum to discharge pt from PT and resolve episode of care. Pt has been inconsistent with attendance since her last session on 08/24/17. Myrene Galas, PTA spoke with Ms. Rion on 09/28/17 regarding therapy and the pt felt that she should be discharged due to a lot of health and other personal issues going on at this time. If she is ready continue in the near future, she can reach out to her physician for another PT order.   PHYSICAL THERAPY DISCHARGE SUMMARY  Visits from Start of Care: 3  Current functional level related to goals / functional outcomes: See above for more details    Remaining deficits: See above for more details    Education / Equipment: See above for more details   Plan: Patient agrees to discharge.  Patient goals were not met. Patient is being discharged due to a change in medical status.  ?????    G-Codes: changing and maintaining body position Goal: 20-40% limited Discharge: 40-60% limited   11:00 AM,09/29/17 Elly Modena PT, [place] [facility] [place]

## 2017-08-26 ENCOUNTER — Encounter: Payer: Medicare Other | Admitting: Physical Therapy

## 2017-08-31 ENCOUNTER — Ambulatory Visit: Payer: Medicare Other | Admitting: Physical Therapy

## 2017-09-02 ENCOUNTER — Encounter: Payer: Medicare Other | Admitting: Physical Therapy

## 2017-09-07 ENCOUNTER — Encounter: Payer: Medicare Other | Admitting: Physical Therapy

## 2017-09-09 ENCOUNTER — Encounter: Payer: Medicare Other | Admitting: Physical Therapy

## 2017-09-14 ENCOUNTER — Ambulatory Visit: Payer: Medicare Other | Admitting: Physical Therapy

## 2017-09-16 ENCOUNTER — Encounter: Payer: Medicare Other | Admitting: Physical Therapy

## 2017-09-21 ENCOUNTER — Ambulatory Visit: Payer: Medicare Other | Admitting: Physical Therapy

## 2017-09-23 ENCOUNTER — Ambulatory Visit: Payer: Medicare Other | Attending: Chiropractic Medicine | Admitting: Physical Therapy

## 2017-09-23 ENCOUNTER — Telehealth: Payer: Self-pay | Admitting: Physical Therapy

## 2017-09-23 DIAGNOSIS — R250 Abnormal head movements: Secondary | ICD-10-CM | POA: Insufficient documentation

## 2017-09-23 DIAGNOSIS — R293 Abnormal posture: Secondary | ICD-10-CM | POA: Insufficient documentation

## 2017-09-23 DIAGNOSIS — M6281 Muscle weakness (generalized): Secondary | ICD-10-CM | POA: Insufficient documentation

## 2017-09-23 DIAGNOSIS — G8929 Other chronic pain: Secondary | ICD-10-CM | POA: Insufficient documentation

## 2017-09-23 DIAGNOSIS — M545 Low back pain: Secondary | ICD-10-CM | POA: Insufficient documentation

## 2017-09-28 ENCOUNTER — Telehealth: Payer: Self-pay | Admitting: Physical Therapy

## 2017-09-28 ENCOUNTER — Encounter: Payer: Medicare Other | Admitting: Physical Therapy

## 2017-09-29 DIAGNOSIS — R293 Abnormal posture: Secondary | ICD-10-CM | POA: Diagnosis not present

## 2017-09-29 DIAGNOSIS — M545 Low back pain: Secondary | ICD-10-CM | POA: Diagnosis not present

## 2017-09-29 DIAGNOSIS — G8929 Other chronic pain: Secondary | ICD-10-CM | POA: Diagnosis present

## 2017-09-29 DIAGNOSIS — R250 Abnormal head movements: Secondary | ICD-10-CM | POA: Diagnosis not present

## 2017-09-29 DIAGNOSIS — M6281 Muscle weakness (generalized): Secondary | ICD-10-CM | POA: Diagnosis present

## 2017-09-30 ENCOUNTER — Ambulatory Visit: Payer: Medicare Other | Admitting: Physical Therapy

## 2017-12-31 ENCOUNTER — Other Ambulatory Visit: Payer: Self-pay | Admitting: Primary Care

## 2017-12-31 DIAGNOSIS — Z1231 Encounter for screening mammogram for malignant neoplasm of breast: Secondary | ICD-10-CM

## 2018-06-18 ENCOUNTER — Other Ambulatory Visit: Payer: Self-pay | Admitting: Nurse Practitioner

## 2018-06-18 DIAGNOSIS — Z1231 Encounter for screening mammogram for malignant neoplasm of breast: Secondary | ICD-10-CM

## 2018-07-21 ENCOUNTER — Ambulatory Visit: Payer: Medicare Other

## 2018-08-25 ENCOUNTER — Ambulatory Visit: Payer: Medicare Other

## 2018-09-27 ENCOUNTER — Ambulatory Visit: Payer: Medicare Other

## 2018-11-05 ENCOUNTER — Ambulatory Visit: Payer: Medicare Other

## 2018-12-14 ENCOUNTER — Ambulatory Visit
Admission: RE | Admit: 2018-12-14 | Discharge: 2018-12-14 | Disposition: A | Discharge status: Home / Self Care | Payer: Medicare Other | Source: Ambulatory Visit | Attending: Nurse Practitioner | Admitting: Nurse Practitioner

## 2018-12-14 DIAGNOSIS — Z1231 Encounter for screening mammogram for malignant neoplasm of breast: Secondary | ICD-10-CM

## 2019-11-02 ENCOUNTER — Ambulatory Visit (INDEPENDENT_AMBULATORY_CARE_PROVIDER_SITE_OTHER): Payer: Medicare Other | Admitting: Orthopaedic Surgery

## 2019-11-02 ENCOUNTER — Other Ambulatory Visit: Payer: Self-pay

## 2019-11-02 ENCOUNTER — Ambulatory Visit: Payer: Self-pay

## 2019-11-02 ENCOUNTER — Encounter: Payer: Self-pay | Admitting: Orthopaedic Surgery

## 2019-11-02 DIAGNOSIS — G8929 Other chronic pain: Secondary | ICD-10-CM

## 2019-11-02 DIAGNOSIS — M5442 Lumbago with sciatica, left side: Secondary | ICD-10-CM

## 2019-11-02 DIAGNOSIS — M4807 Spinal stenosis, lumbosacral region: Secondary | ICD-10-CM

## 2019-11-02 MED ORDER — METHYLPREDNISOLONE 4 MG PO TABS: ORAL_TABLET | ORAL | 0 refills | Status: DC

## 2019-11-02 MED ORDER — TIZANIDINE HCL 4 MG PO TABS: 4.0000 mg | ORAL_TABLET | Freq: Three times a day (TID) | ORAL | 0 refills | Status: DC | PRN

## 2019-11-02 NOTE — Progress Notes (Signed)
Office Visit Note   Patient: [name] Hinton           Date of Birth: 1961-06-30           MRN: [id number] Visit Date: 11/02/2019              Requested by: No referring provider defined for this encounter. PCP: System, Pcp Not In   Assessment & Plan: Visit Diagnoses:  1. Chronic bilateral low back pain with left-sided sciatica     Plan: Given the severity of the patient's pain and her plain film findings I do feel it is warranted to start her on a 6-day steroid taper.  Also will try some Zanaflex as a muscle relaxant.  I would like to send her for an MRI of her lumbar spine to assess for nerve compression that can help determine whether or not an ESI would be helpful.  She agrees with this treatment plan.  Follow-Up Instructions: No follow-ups on file.  The patient will call for follow-up when she knows she has an MRI scheduled for her lumbar spine.  Orders:  Orders Placed This Encounter  Procedures   XR Lumbar Spine 2-3 Views   Meds ordered this encounter  Medications   methylPREDNISolone (MEDROL) 4 MG tablet    Sig: Medrol dose pack. Take as instructed    Dispense:  21 tablet    Refill:  0   tiZANidine (ZANAFLEX) 4 MG tablet    Sig: Take 1 tablet (4 mg total) by mouth every 8 (eight) hours as needed for muscle spasms.    Dispense:  60 tablet    Refill:  0      Procedures: No procedures performed   Clinical Data: No additional findings.   Subjective: Chief Complaint  Patient presents with   Left Leg - Pain  The patient is a 58 year old female that I am seeing for the first time.  Apparently she has chronic lumbar spine issues with known degenerative scoliosis.  She has been having left leg pain and numbness for 6 weeks now is gotten so bad at times she states that she cannot get out of bed.  She is not a diabetic.  She takes 300 mg of Neurontin 3 times a day.  She denies any change in bowel bladder function.  She has seen another orthopedic group in town who  did recommend injections but she has never had injections.  She has not had a recent MRI of her spine either.  HPI  Review of Systems She currently denies any headache, chest pain, shortness of breath, fever, chills, nausea, vomiting  Objective: Vital Signs: There were no vitals taken for this visit.  Physical Exam She is alert and oriented x3 and in no acute distress.  She is petite. Ortho Exam On examination she does have numbness and tingling down the medial aspect of her thigh knee and leg.  There is pain over the pes bursa as well.  She has a positive straight leg raise on the left side.  There is weakness in her plantarflexion and dorsiflexion of the foot on the left side. Specialty Comments:  No specialty comments available.  Imaging: Xr Lumbar Spine 2-3 Views  Result Date: 11/02/2019 2 views of the lumbar spine show severe degenerative changes at multiple levels with degenerative scoliosis.  There is significant disc space loss at multiple levels and    PMFS History: There are no active problems to display for this patient.  Past Medical History:  Diagnosis Date   Anxiety    Bipolar affective ([place])    Chronic headaches    Depression    Hypercholesteremia    Hypertension    Restless leg syndrome     Family History  Problem Relation Age of Onset   Breast cancer Neg Hx     Past Surgical History:  Procedure Laterality Date   ABDOMINAL HYSTERECTOMY     OOPHORECTOMY     TONSILLECTOMY     Social History   Occupational History   Not on file  Tobacco Use   Smoking status: Current Some Day Smoker   Smokeless tobacco: Never Used  Substance and Sexual Activity   Alcohol use: No   Drug use: No   Sexual activity: Yes    Birth control/protection: Surgical

## 2019-11-28 ENCOUNTER — Other Ambulatory Visit: Payer: Self-pay

## 2019-11-28 ENCOUNTER — Ambulatory Visit
Admission: RE | Admit: 2019-11-28 | Discharge: 2019-11-28 | Disposition: A | Discharge status: Home / Self Care | Payer: Medicare Other | Source: Ambulatory Visit | Attending: Orthopaedic Surgery | Admitting: Orthopaedic Surgery

## 2019-11-28 DIAGNOSIS — M4807 Spinal stenosis, lumbosacral region: Secondary | ICD-10-CM

## 2019-12-14 ENCOUNTER — Encounter: Payer: Self-pay | Admitting: Orthopaedic Surgery

## 2019-12-14 ENCOUNTER — Other Ambulatory Visit: Payer: Self-pay

## 2019-12-14 ENCOUNTER — Ambulatory Visit (INDEPENDENT_AMBULATORY_CARE_PROVIDER_SITE_OTHER): Payer: Medicare Other | Admitting: Orthopaedic Surgery

## 2019-12-14 DIAGNOSIS — M5442 Lumbago with sciatica, left side: Secondary | ICD-10-CM | POA: Diagnosis not present

## 2019-12-14 DIAGNOSIS — G8929 Other chronic pain: Secondary | ICD-10-CM | POA: Diagnosis not present

## 2019-12-14 DIAGNOSIS — M4807 Spinal stenosis, lumbosacral region: Secondary | ICD-10-CM | POA: Diagnosis not present

## 2019-12-14 MED ORDER — TIZANIDINE HCL 4 MG PO TABS: 4.0000 mg | ORAL_TABLET | Freq: Three times a day (TID) | ORAL | 3 refills | Status: DC | PRN

## 2019-12-14 NOTE — Progress Notes (Signed)
The patient is well-known to [place].  She comes in today to go over an MRI of her lumbar spine.  She is a very thin individual and has known thoracolumbar scoliosis.  She is 59 years old.  Due to her left-sided radicular symptoms going down her leg and her back pain we sent her for an MRI to determine if there is anything that can be done to help with the symptoms.  We talked about physical therapy but she is not really interested in that.  She is working on core strengthening exercises.  She is a very thin individual.  She asked about a back brace as well.  On exam her symptoms seem to be consistent with sciatica on the left side.  She does have a scoliosis.  She is very compliant with exam.  She does have pain when she stands up and seems to lean more to the left side.  MRI does show her scoliosis.  She does have moderate stenosis that is foraminal and multifactorial at several levels to the left side.  This is that L2-L3, L3-L4 and L5-S1.  Most of her stenosis is again left-sided.  This seems to correlate with some of her symptoms.  At this point we will continue her muscle relaxants per her request.  I would like to send her to Dr. Ernestina Patches as a consultation for considering epidural steroid injections to the left side but I am not sure if it should be more at L2-L3 versus one of the other levels to the left side.  She understands this may take multiple appointments and potential other injections to figure out what is the best area to help decrease her symptoms.  I will see her back myself in about 4 weeks.  All question concerns were answered addressed.

## 2019-12-15 ENCOUNTER — Other Ambulatory Visit: Payer: Medicare Other

## 2020-01-04 ENCOUNTER — Encounter: Payer: Medicare Other | Admitting: Physical Medicine and Rehabilitation

## 2020-01-11 ENCOUNTER — Other Ambulatory Visit: Payer: Self-pay

## 2020-01-11 ENCOUNTER — Ambulatory Visit (INDEPENDENT_AMBULATORY_CARE_PROVIDER_SITE_OTHER): Payer: Medicare Other | Admitting: Physical Medicine and Rehabilitation

## 2020-01-11 ENCOUNTER — Encounter: Payer: Self-pay | Admitting: Physical Medicine and Rehabilitation

## 2020-01-11 ENCOUNTER — Ambulatory Visit: Payer: Self-pay

## 2020-01-11 ENCOUNTER — Ambulatory Visit: Payer: Medicare Other | Admitting: Orthopaedic Surgery

## 2020-01-11 VITALS — BP 129/82 | HR 62

## 2020-01-11 DIAGNOSIS — M48062 Spinal stenosis, lumbar region with neurogenic claudication: Secondary | ICD-10-CM | POA: Diagnosis not present

## 2020-01-11 MED ORDER — METHYLPREDNISOLONE ACETATE 80 MG/ML IJ SUSP
40.0000 mg | Freq: Once | INTRAMUSCULAR | Status: AC
  Administered 2020-01-11: 15:00:00 40 mg

## 2020-01-11 NOTE — Progress Notes (Signed)
 .  Numeric Pain Rating Scale and Functional Assessment Average Pain 6   In the last MONTH (on 0-10 scale) has pain interfered with the following?  1. General activity like being  able to carry out your everyday physical activities such as walking, climbing stairs, carrying groceries, or moving a chair?  Rating(6)   +Driver, -BT, -Dye Allergies.  

## 2020-01-12 NOTE — Progress Notes (Signed)
[name] Hinton - 59 y.o. female MRN [id number]  Date of birth: September 23, 1961  Office Visit Note: Visit Date: 01/11/2020 PCP: System, Pcp Not In Referred by: Mcarthur Rossetti*  Subjective: Chief Complaint  Patient presents with  . Lower Back - Pain  . Left Leg - Pain   HPI: [name] Hinton is a 59 y.o. female who comes in today For planned lumbar epidural injection at the request of Dr. Jean Rosenthal.  Patient is having had 2 months of radicular pain down the left leg and almost L5 distribution.  She rates her pain as a 6 out of 10.  She has had all manner of conservative care at this point over the last several months with no relief.  She has a history of lumbar scoliosis and intermittent back pain.  MRI was obtained and reviewed with the patient today with images and spine model.  She has really more left-sided findings at L3-4 and L4-5 with disc osteophyte and lateral recess narrowing with no high-grade central stenosis.  There is extraforaminal protrusion at L3 but I am not sure that would cause all of her leg pain.  She also has disc protrusion extrusion at L1-2 in the upper lumbar region but is really noncompressive and this was reviewed with her.  ROS Otherwise per HPI.  Assessment & Plan: Visit Diagnoses:  1. Spinal stenosis of lumbar region with neurogenic claudication     Plan: No additional findings.   Meds & Orders:  Meds ordered this encounter  Medications  . methylPREDNISolone acetate (DEPO-MEDROL) injection 40 mg    Orders Placed This Encounter  Procedures  . XR C-ARM NO REPORT  . Epidural Steroid injection    Follow-up: No follow-ups on file.   Procedures: No procedures performed  Lumbar Epidural Steroid Injection - Interlaminar Approach with Fluoroscopic Guidance  Patient: [name] Hinton      Date of Birth: Dec 15, 1960 MRN: [id number] PCP: System, Pcp Not In      Visit Date: 01/11/2020   Universal Protocol:     Consent Given By: the  patient  Position: PRONE  Additional Comments: Vital signs were monitored before and after the procedure. Patient was prepped and draped in the usual sterile fashion. The correct patient, procedure, and site was verified.   Injection Procedure Details:  Procedure Site One Meds Administered:  Meds ordered this encounter  Medications  . methylPREDNISolone acetate (DEPO-MEDROL) injection 40 mg     Laterality: Left  Location/Site:  L4-L5  Needle size: 20 G  Needle type: Tuohy  Needle Placement: Paramedian epidural  Findings:   -Comments: Excellent flow of contrast into the epidural space.  Procedure Details: Using a paramedian approach from the side mentioned above, the region overlying the inferior lamina was localized under fluoroscopic visualization and the soft tissues overlying this structure were infiltrated with 4 ml. of 1% Lidocaine without Epinephrine. The Tuohy needle was inserted into the epidural space using a paramedian approach.   The epidural space was localized using loss of resistance along with lateral and bi-planar fluoroscopic views.  After negative aspirate for air, blood, and CSF, a 2 ml. volume of Isovue-250 was injected into the epidural space and the flow of contrast was observed. Radiographs were obtained for documentation purposes.    The injectate was administered into the level noted above.   Additional Comments:  The patient tolerated the procedure well Dressing: 2 x 2 sterile gauze and Band-Aid    Post-procedure details: Patient was observed during  the procedure. Post-procedure instructions were reviewed.  Patient left the clinic in stable condition.     Clinical History: MRI LUMBAR SPINE WITHOUT CONTRAST  TECHNIQUE: Multiplanar, multisequence MR imaging of the lumbar spine was performed. No intravenous contrast was administered.  COMPARISON:  11/02/2019  FINDINGS: Segmentation:  Normal  Alignment: Mild retrolisthesis  L1-2, L2-3, L3-4, L4-5. Mild anterolisthesis L5-S1. levoscoliosis at L1-2.  Vertebrae:  Negative for fracture or mass.  Conus medullaris and cauda equina: Conus extends to the L1 level. Conus and cauda equina appear normal.  Paraspinal and other soft tissues: Negative for paraspinous mass or adenopathy.  Disc levels:  T11-12: Left-sided disc protrusion. Mild flattening of the left side of the cord. Mild left foraminal narrowing  T12-L1: Extensive disc degeneration. Small central disc protrusion. Extruded disc fragment in the midline behind the L1 vertebral body. No significant spinal stenosis. Moderate left foraminal stenosis.  L1-2: Central disc protrusion with upgoing extruded disc fragment in the midline. Disc degeneration with endplate spurring right greater than left. Moderate subarticular stenosis on the right. Mild spinal stenosis.  L2-3: Disc degeneration with disc space narrowing and spurring left greater than right. Moderate to severe subarticular stenosis on the left. Mild spinal stenosis.  L3-4: Disc degeneration with spurring asymmetric on the left. Extraforaminal disc protrusion and spurring on the left. Moderate subarticular and foraminal stenosis on the left due to spurring. Mild facet degeneration  L4-5: Disc degeneration and spurring left greater than right. Moderate subarticular and foraminal stenosis on the left due to disc protrusion and spurring. Mild facet degeneration  L5-S1: Disc degeneration and disc bulging with small central disc protrusion. Mild facet degeneration. Moderate foraminal stenosis bilaterally due to spurring.  IMPRESSION: Lumbar levoscoliosis. Multilevel disc and facet degeneration throughout the lower thoracic and lumbar spine causing stenosis as above.  Negative for fracture or mass.   Electronically Signed   By: Franchot Gallo M.D.   On: 11/28/2019 17:58   She reports that she has been smoking. She has  never used smokeless tobacco. No results for input(s): HGBA1C, LABURIC in the last 8760 hours.  Objective:  VS:  HT:    WT:   BMI:     BP:129/82  HR:62bpm  TEMP: ( )  RESP:  Physical Exam  Ortho Exam Imaging: XR C-ARM NO REPORT  Result Date: 01/11/2020 Please see Notes tab for imaging impression.   Past Medical/Family/Surgical/Social History: Medications & Allergies reviewed per EMR, new medications updated. There are no problems to display for this patient.  Past Medical History:  Diagnosis Date  . Anxiety   . Bipolar affective ([place])   . Chronic headaches   . Depression   . Hypercholesteremia   . Hypertension   . Restless leg syndrome    Family History  Problem Relation Age of Onset  . Breast cancer Neg Hx    Past Surgical History:  Procedure Laterality Date  . ABDOMINAL HYSTERECTOMY    . OOPHORECTOMY    . TONSILLECTOMY     Social History   Occupational History  . Not on file  Tobacco Use  . Smoking status: Current Some Day Smoker  . Smokeless tobacco: Never Used  Substance and Sexual Activity  . Alcohol use: No  . Drug use: No  . Sexual activity: Yes    Birth control/protection: Surgical

## 2020-01-12 NOTE — Procedures (Signed)
Lumbar Epidural Steroid Injection - Interlaminar Approach with Fluoroscopic Guidance  Patient: [name] Hinton      Date of Birth: 1960/12/28 MRN: [id number] PCP: System, Pcp Not In      Visit Date: 01/11/2020   Universal Protocol:     Consent Given By: the patient  Position: PRONE  Additional Comments: Vital signs were monitored before and after the procedure. Patient was prepped and draped in the usual sterile fashion. The correct patient, procedure, and site was verified.   Injection Procedure Details:  Procedure Site One Meds Administered:  Meds ordered this encounter  Medications  . methylPREDNISolone acetate (DEPO-MEDROL) injection 40 mg     Laterality: Left  Location/Site:  L4-L5  Needle size: 20 G  Needle type: Tuohy  Needle Placement: Paramedian epidural  Findings:   -Comments: Excellent flow of contrast into the epidural space.  Procedure Details: Using a paramedian approach from the side mentioned above, the region overlying the inferior lamina was localized under fluoroscopic visualization and the soft tissues overlying this structure were infiltrated with 4 ml. of 1% Lidocaine without Epinephrine. The Tuohy needle was inserted into the epidural space using a paramedian approach.   The epidural space was localized using loss of resistance along with lateral and bi-planar fluoroscopic views.  After negative aspirate for air, blood, and CSF, a 2 ml. volume of Isovue-250 was injected into the epidural space and the flow of contrast was observed. Radiographs were obtained for documentation purposes.    The injectate was administered into the level noted above.   Additional Comments:  The patient tolerated the procedure well Dressing: 2 x 2 sterile gauze and Band-Aid    Post-procedure details: Patient was observed during the procedure. Post-procedure instructions were reviewed.  Patient left the clinic in stable condition.

## 2020-01-18 ENCOUNTER — Other Ambulatory Visit: Payer: Self-pay

## 2020-01-18 ENCOUNTER — Ambulatory Visit (INDEPENDENT_AMBULATORY_CARE_PROVIDER_SITE_OTHER): Payer: Medicare Other | Admitting: Orthopaedic Surgery

## 2020-01-18 ENCOUNTER — Encounter: Payer: Self-pay | Admitting: Orthopaedic Surgery

## 2020-01-18 DIAGNOSIS — G8929 Other chronic pain: Secondary | ICD-10-CM

## 2020-01-18 DIAGNOSIS — M5442 Lumbago with sciatica, left side: Secondary | ICD-10-CM | POA: Diagnosis not present

## 2020-01-18 DIAGNOSIS — M4807 Spinal stenosis, lumbosacral region: Secondary | ICD-10-CM | POA: Diagnosis not present

## 2020-01-18 NOTE — Progress Notes (Signed)
The patient comes in today about a week out from an intervention to her lumbar spine by Dr. Ernestina Patches.  She had a left-sided L4-L5 injection.  It was somewhat difficult determining what level to inject given her scoliosis and given the fact that she had multifactorial changes left side at multiple levels of lumbar spine.  She states now the injection is just beginning to work and she is doing well overall.  She actually looks good overall.  On exam she has negative straight leg raise bilaterally.  She has 5 out of 5 strength in her bilateral lower extremities and no sensory deficits.  She stands more upright and looks better overall.  At this point follow-up can be as needed since she is doing so well.  Obviously if things flareup again she will let us know.  All question concerns were answered and addressed.

## 2020-01-27 ENCOUNTER — Other Ambulatory Visit: Payer: Self-pay | Admitting: Psychiatry

## 2020-01-27 DIAGNOSIS — Z1231 Encounter for screening mammogram for malignant neoplasm of breast: Secondary | ICD-10-CM

## 2020-02-28 ENCOUNTER — Ambulatory Visit: Payer: Medicare Other

## 2020-05-19 ENCOUNTER — Other Ambulatory Visit: Payer: Self-pay | Admitting: Orthopaedic Surgery

## 2020-05-21 NOTE — Telephone Encounter (Signed)
Please advise 

## 2020-10-16 ENCOUNTER — Other Ambulatory Visit: Payer: Self-pay | Admitting: Orthopaedic Surgery

## 2020-10-17 ENCOUNTER — Ambulatory Visit: Payer: Self-pay

## 2020-10-17 ENCOUNTER — Encounter: Payer: Self-pay | Admitting: Physician Assistant

## 2020-10-17 ENCOUNTER — Ambulatory Visit (INDEPENDENT_AMBULATORY_CARE_PROVIDER_SITE_OTHER): Payer: Medicare Other | Admitting: Physician Assistant

## 2020-10-17 DIAGNOSIS — M25532 Pain in left wrist: Secondary | ICD-10-CM

## 2020-10-17 DIAGNOSIS — S62102A Fracture of unspecified carpal bone, left wrist, initial encounter for closed fracture: Secondary | ICD-10-CM | POA: Diagnosis not present

## 2020-10-17 MED ORDER — HYDROCODONE-ACETAMINOPHEN 5-325 MG PO TABS: 1.0000 | ORAL_TABLET | Freq: Four times a day (QID) | ORAL | 0 refills | Status: DC | PRN

## 2020-10-17 NOTE — Progress Notes (Signed)
Office Visit Note   Patient: [name] Hinton           Date of Birth: [birth year]           MRN: [id number] Visit Date: 10/17/2020              Requested by: No referring provider defined for this encounter. PCP: Pcp, No   Assessment & Plan: Visit Diagnoses:  1. Pain in left wrist   2. Wrist fracture, closed, left, initial encounter     Plan: After reviewing the radiographs with the patient recommend no surgical intervention.  We will treat this with a removable wrist splint that she can take off for hygiene purposes.  See her back in just 2 weeks to check overall position alignment with 3 views of the left wrist at that time.  Elevation wiggling fingers gentle range of motion elbow are all encouraged.  Follow-Up Instructions: Return in about 2 weeks (around 10/31/2020).   Orders:  Orders Placed This Encounter  Procedures  . XR Hand Complete Left  . XR Wrist Complete Left   Meds ordered this encounter  Medications  . HYDROcodone-acetaminophen (NORCO/VICODIN) 5-325 MG tablet    Sig: Take 1 tablet by mouth every 6 (six) hours as needed for moderate pain.    Dispense:  30 tablet    Refill:  0      Procedures: No procedures performed   Clinical Data: No additional findings.   Subjective: Chief Complaint  Patient presents with  . Left Hand - Pain  . Left Wrist - Pain    HPI [name] Hinton comes in today for left wrist and hand pain started 2 months ago.  She had no injury at that time but was having pain over the distal radius.  2 weeks ago she fell on her hand when she tripped over some clothes on the floor.  She denies any injury outside of the left wrist and hand.  She notes swelling.  She is taking Aleve for the pain. Review of Systems  Negative for fevers chills shortness of breath chest pain dizziness. Objective: Vital Signs: There were no vitals taken for this visit.  Physical Exam Constitutional:      Appearance: She is not ill-appearing or diaphoretic.    Pulmonary:     Effort: Pulmonary effort is normal.  Neurological:     Mental Status: She is alert and oriented to person, place, and time.  Psychiatric:        Mood and Affect: Mood normal.     Ortho Exam Left elbow full range of motion she is able to supinate pronate the left forearm.  Radial pulses intact.  Sensation grossly intact throughout hand to light touch.  She is able to give thumbs up.  She has full extension of the fingers.  She has tenderness over the left distal radius with edema and no ecchymosis.  No gross deformity of the left wrist. Specialty Comments:  No specialty comments available.  Imaging: XR Wrist Complete Left  Result Date: 10/17/2020 Left wrist 3 views: Distal radius fracture nonarticular.  With slight shortening.  No dorsal or volar angulation.  Bone appears osteopenic.  No fracture involving the distal ulna.  XR Hand Complete Left  Result Date: 10/17/2020 3 views left hand: No acute fracture of the hand.  Of bone appears osteopenic.  There is distal left radius fracture noted.  Please see left wrist dictation for further details.    PMFS History: Patient Active Problem  List   Diagnosis Date Noted  . 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 Low back pain 09/24/2011  . Restless legs syndrome (RLS) 03/24/2011  . Depressive disorder, not elsewhere classified 09/17/2010  . Bipolar I disorder, single manic episode ([place]) 08/20/2010  . Benign essential hypertension 05/14/2010  . Hyperlipemia 05/14/2010  . Osteopenia 12/03/2009  . Obsessive-compulsive disorder 10/17/2009   Past Medical History:  Diagnosis Date  . Anxiety   . Bipolar affective ([place])   . Chronic headaches   . Depression   . Hypercholesteremia   . Hypertension   . Restless leg syndrome     Family History  Problem Relation Age of Onset  . Breast cancer Neg Hx     Past Surgical History:  Procedure Laterality Date  . ABDOMINAL HYSTERECTOMY    . OOPHORECTOMY    . TONSILLECTOMY      Social History   Occupational History  . Not on file  Tobacco Use  . Smoking status: Current Some Day Smoker  . Smokeless tobacco: Never Used  Substance and Sexual Activity  . Alcohol use: No  . Drug use: No  . Sexual activity: Yes    Birth control/protection: Surgical

## 2020-10-31 ENCOUNTER — Ambulatory Visit: Payer: Medicare Other | Admitting: Physician Assistant

## 2020-11-05 ENCOUNTER — Ambulatory Visit (INDEPENDENT_AMBULATORY_CARE_PROVIDER_SITE_OTHER): Payer: Medicare Other

## 2020-11-05 ENCOUNTER — Encounter: Payer: Self-pay | Admitting: Physician Assistant

## 2020-11-05 ENCOUNTER — Ambulatory Visit (INDEPENDENT_AMBULATORY_CARE_PROVIDER_SITE_OTHER): Payer: Medicare Other | Admitting: Physician Assistant

## 2020-11-05 DIAGNOSIS — M25532 Pain in left wrist: Secondary | ICD-10-CM

## 2020-11-05 NOTE — Progress Notes (Signed)
   Office Visit Note   Patient: [name] Hinton           Date of Birth: 27-Jan-1961           MRN: [id number] Visit Date: 11/05/2020              Requested by: No referring provider defined for this encounter. PCP: Pcp, No   Assessment & Plan: Visit Diagnoses:  1. Pain in left wrist     Plan: She will continue the Velcro wrist splint coming out of it for hygiene purposes and for gentle range of motion the wrist and hand.  She is to avoid heavy lifting or any gripping or twisting maneuvers with the wrist.  We will see her back in 1 month for repeat x-rays of the left wrist.  Questions were encouraged and answered  Follow-Up Instructions: Return in about 4 weeks (around 12/03/2020) for Radiographs.   Orders:  Orders Placed This Encounter  Procedures  . XR Wrist Complete Left   No orders of the defined types were placed in this encounter.     Procedures: No procedures performed   Clinical Data: No additional findings.   Subjective: Chief Complaint  Patient presents with  . Left Wrist - Pain    HPI [name] Hinton returns today 4 weeks status post left wrist fracture.  She has been in a Velcro wrist splint.  She states she given some irritation of her distal ulna region.  But overall feels that she is improving. Review of Systems See HPI  Objective: Vital Signs: There were no vitals taken for this visit.  Physical Exam General well-developed well-nourished female no acute distress Ortho Exam Left wrist she has tenderness over the distal radius and ulna.  Full motor left hand.  No rashes skin lesions ulcerations erythema throughout hand forearm. Specialty Comments:  No specialty comments available.  Imaging: XR Wrist Complete Left  Result Date: 11/05/2020 Left wrist 3 views: Overall position alignment remains unchanged from prior exam.  Again shortening of the nonarticular distal radius fracture.  Good consolidation is seen.  Avulsion of the ulnar styloid.  No other  fractures identified.    PMFS History: Patient Active Problem List   Diagnosis Date Noted  . 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 Low back pain 09/24/2011  . Restless legs syndrome (RLS) 03/24/2011  . Depressive disorder, not elsewhere classified 09/17/2010  . Bipolar I disorder, single manic episode ([place]) 08/20/2010  . Benign essential hypertension 05/14/2010  . Hyperlipemia 05/14/2010  . Osteopenia 12/03/2009  . Obsessive-compulsive disorder 10/17/2009   Past Medical History:  Diagnosis Date  . Anxiety   . Bipolar affective ([place])   . Chronic headaches   . Depression   . Hypercholesteremia   . Hypertension   . Restless leg syndrome     Family History  Problem Relation Age of Onset  . Breast cancer Neg Hx     Past Surgical History:  Procedure Laterality Date  . ABDOMINAL HYSTERECTOMY    . OOPHORECTOMY    . TONSILLECTOMY     Social History   Occupational History  . Not on file  Tobacco Use  . Smoking status: Current Some Day Smoker  . Smokeless tobacco: Never Used  Substance and Sexual Activity  . Alcohol use: No  . Drug use: No  . Sexual activity: Yes    Birth control/protection: Surgical

## 2020-12-03 ENCOUNTER — Ambulatory Visit: Payer: Medicare Other | Admitting: Physician Assistant

## 2020-12-11 ENCOUNTER — Ambulatory Visit: Payer: Medicare Other | Admitting: Orthopaedic Surgery

## 2020-12-17 ENCOUNTER — Ambulatory Visit: Payer: Medicare Other | Admitting: Orthopaedic Surgery

## 2020-12-24 ENCOUNTER — Ambulatory Visit: Payer: Medicare Other | Admitting: Physician Assistant

## 2020-12-26 ENCOUNTER — Ambulatory Visit: Payer: Medicare Other | Admitting: Physician Assistant

## 2020-12-27 ENCOUNTER — Institutional Professional Consult (permissible substitution): Payer: Medicare Other | Admitting: Pulmonary Disease

## 2020-12-29 ENCOUNTER — Emergency Department (HOSPITAL_COMMUNITY): Payer: Medicare Other

## 2020-12-29 ENCOUNTER — Other Ambulatory Visit: Payer: Self-pay

## 2020-12-29 ENCOUNTER — Encounter (HOSPITAL_COMMUNITY): Payer: Self-pay

## 2020-12-29 ENCOUNTER — Emergency Department (HOSPITAL_COMMUNITY)
Admission: EM | Admit: 2020-12-29 | Discharge: 2020-12-29 | Disposition: A | Discharge status: Home / Self Care | Payer: Medicare Other | Attending: Emergency Medicine | Admitting: Emergency Medicine

## 2020-12-29 DIAGNOSIS — R072 Precordial pain: Secondary | ICD-10-CM | POA: Insufficient documentation

## 2020-12-29 DIAGNOSIS — F17200 Nicotine dependence, unspecified, uncomplicated: Secondary | ICD-10-CM | POA: Insufficient documentation

## 2020-12-29 DIAGNOSIS — Z79899 Other long term (current) drug therapy: Secondary | ICD-10-CM | POA: Insufficient documentation

## 2020-12-29 DIAGNOSIS — R079 Chest pain, unspecified: Secondary | ICD-10-CM | POA: Diagnosis present

## 2020-12-29 DIAGNOSIS — I10 Essential (primary) hypertension: Secondary | ICD-10-CM | POA: Diagnosis not present

## 2020-12-29 LAB — CBC
HCT: 39.7 % (ref 36.0–46.0)
Hemoglobin: 13 g/dL (ref 12.0–15.0)
MCH: 31.1 pg (ref 26.0–34.0)
MCHC: 32.7 g/dL (ref 30.0–36.0)
MCV: 95 fL (ref 80.0–100.0)
Platelets: 370 10*3/uL (ref 150–400)
RBC: 4.18 MIL/uL (ref 3.87–5.11)
RDW: 12.8 % (ref 11.5–15.5)
WBC: 8.6 10*3/uL (ref 4.0–10.5)
nRBC: 0 % (ref 0.0–0.2)

## 2020-12-29 LAB — BASIC METABOLIC PANEL
Anion gap: 9 (ref 5–15)
BUN: 19 mg/dL (ref 6–20)
CO2: 26 mmol/L (ref 22–32)
Calcium: 9 mg/dL (ref 8.9–10.3)
Chloride: 106 mmol/L (ref 98–111)
Creatinine, Ser: 0.8 mg/dL (ref 0.44–1.00)
GFR, Estimated: >60 mL/min (ref >60)
Glucose, Bld: 96 mg/dL (ref 70–99)
Potassium: 3.8 mmol/L (ref 3.5–5.1)
Sodium: 141 mmol/L (ref 135–145)

## 2020-12-29 LAB — TROPONIN I (HIGH SENSITIVITY)
Troponin I (High Sensitivity): 6 ng/L (ref <18)
Troponin I (High Sensitivity): 8 ng/L (ref <18)

## 2020-12-29 MED ORDER — IOHEXOL 350 MG/ML SOLN
100.0000 mL | Freq: Once | INTRAVENOUS | Status: AC | PRN
  Administered 2020-12-29: 100 mL via INTRAVENOUS

## 2020-12-29 MED ORDER — OXYCODONE-ACETAMINOPHEN 5-325 MG PO TABS
1.0000 | ORAL_TABLET | Freq: Once | ORAL | Status: AC
  Administered 2020-12-29: 1 via ORAL
  Filled 2020-12-29: qty 1

## 2020-12-29 NOTE — ED Notes (Signed)
Discharged no concerns at this time.  °

## 2020-12-29 NOTE — Discharge Instructions (Addendum)
You came to the hospital to have your chest pain evaluated.  Your EKG and lab work were reassuring that you are not having a heart attack.  CT scan of your chest did not show any blood clots in your lungs.  It did show a small nodule in your right lower lobe, please follow-up with your primary care provider for further imaging.  Please take Ibuprofen (Advil, motrin) and Tylenol (acetaminophen) to relieve your pain.    You may take up to 600 MG (3 pills) of normal strength ibuprofen every 8 hours as needed.   You make take tylenol, up to 1,000 mg (two extra strength pills) every 8 hours as needed.   It is safe to take ibuprofen and tylenol at the same time as they work differently.   Do not take more than 3,000 mg tylenol in a 24 hour period (not more than one dose every 8 hours.  Please check all medication labels as many medications such as pain and cold medications may contain tylenol.  Do not drink alcohol while taking these medications.  Do not take other NSAID'S while taking ibuprofen (such as aleve or naproxen).  Please take ibuprofen with food to decrease stomach upset.  Please return to the emergency department if: Your chest pain gets worse. You have a cough that gets worse, or you cough up blood. You have severe pain in your abdomen. You faint. You have sudden, unexplained chest discomfort. You have sudden, unexplained discomfort in your arms, back, neck, or jaw. You have shortness of breath at any time. You suddenly start to sweat, or your skin gets clammy. You feel nausea or you vomit. You suddenly feel lightheaded or dizzy. You have severe weakness, or unexplained weakness or fatigue. Your heart begins to beat quickly, or it feels like it is skipping beats.

## 2020-12-29 NOTE — ED Triage Notes (Signed)
Patient states she has had central chest pain x4 days and is getting worse, endorses breast and rib pain as well. Denies recent falls or trauma. Endorses increase in pain w/ leaning forward. Denies cough, SOB, N/V/D.

## 2020-12-29 NOTE — ED Provider Notes (Signed)
[facility] DEPT Provider Note   CSN: [id number] Arrival date & time: 12/29/20  1628     History Chief Complaint  Patient presents with   Chest Pain    Anacecilia Demesa Harpham is a 60 y.o. female with a history of hypertension, hyperlipidemia, tobacco use (40 year history), anxiety, bipolar affective, osteopenia.  Presents with a chief complaint of central chest pain, started 3 days prior, pain is constant, 7/10 on the pain scale, described as sharp stabbing, worse with movement, worse with leaning forward, worse with movement.  Patient reports that pain has radiated across her chest to bilateral flanks. No alleviating factors.  No change in pain with eating or exertion.  No associated nausea, vomiting, or diaphoresis.  Patient reports that she slipped and fell on ice on Wednesday 1/26.  Patient denies any injuries at that time, and on her back, no loss conscious, no head, neck or back pain.    Patient reports that she is prescribed medication for high blood pressure however sometimes forgets to take this medication.  Patient reports that she did not take her medication yesterday or today.  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     Family History  Problem Relation Age of Onset   Breast cancer Neg Hx     Social History   Tobacco Use   Smoking status: Current Some Day Smoker   Smokeless tobacco: Never Used  Substance Use Topics   Alcohol use: No   Drug use: No    Home Medications Prior to Admission medications   Medication Sig Start Date End Date Taking? Authorizing Provider  b complex vitamins tablet Take 1 tablet by mouth daily.    [provider]  buPROPion (WELLBUTRIN XL) 150 MG 24 hr tablet Take 150 mg by mouth every morning.    [provider]  calcium carbonate (OS-CAL - DOSED IN MG OF ELEMENTAL CALCIUM) 1250 MG tablet Take 1 tablet by mouth daily.    [provider]  cholecalciferol (VITAMIN D) 1000 UNITS tablet Take 1,000 Units by mouth daily.    [provider]  clonazePAM (KLONOPIN) 0.5 MG tablet Take 0.5 mg by mouth 2 (two) times daily as needed. 09/14/20   [provider]  gabapentin (NEURONTIN) 300 MG capsule Take 300 mg by mouth 3 (three) times daily.    [provider]  HYDROcodone-acetaminophen (NORCO/VICODIN) 5-325 MG tablet Take 1 tablet by mouth every 6 (six) hours as needed for moderate pain. 10/17/20   Pete Pelt, PA-C  lamoTRIgine (LAMICTAL) 100 MG tablet Take 100 mg by mouth 2 (two) times daily. 08/03/20   [provider]  losartan-hydrochlorothiazide (HYZAAR) 100-12.5 MG tablet Take 1 tablet by mouth daily.    [provider]  metoprolol succinate (TOPROL-XL) 100 MG 24 hr tablet Take 100 mg by mouth every morning. Take with or immediately following a meal.    [provider]  Multiple Vitamin (MULTIVITAMIN WITH MINERALS) TABS Take 1 tablet by mouth every morning.    [provider]  omeprazole (PRILOSEC) 40 MG capsule Take 40 mg by mouth every morning.     [provider]  QUEtiapine (SEROQUEL) 200 MG tablet Take 200 mg by mouth at bedtime.     [provider]  rOPINIRole (REQUIP) 0.25 MG tablet Take 0.25 mg by mouth at bedtime. At 4pm    [provider]  simvastatin (ZOCOR) 20 MG tablet Take 20 mg by mouth at bedtime. 10/01/20   [provider]  tiZANidine (ZANAFLEX) 4 MG tablet TAKE 1 TABLET (4 MG TOTAL) BY MOUTH EVERY 8 (EIGHT) HOURS AS NEEDED FOR MUSCLE SPASMS. 10/16/20   Mcarthur Rossetti, MD    Allergies    Patient has no known allergies.  Review of Systems   Review of Systems  Constitutional: Negative for chills and fever.  Eyes: Negative for visual disturbance.  Respiratory: Negative for cough and shortness of breath.   Cardiovascular: Positive for chest pain. Negative for palpitations and leg swelling.  Gastrointestinal: Negative for abdominal pain, nausea and vomiting.  Genitourinary: Negative for difficulty urinating and dysuria.  Musculoskeletal: Negative for back pain and neck pain.  Skin: Negative for color change and rash.  Neurological: Negative for dizziness, syncope, light-headedness and headaches.  Psychiatric/Behavioral: Negative for confusion.    Physical Exam Updated Vital Signs BP (!) 197/100    Pulse 77    Temp 98.1 F (36.7 C) (Oral)    Resp 20    Ht 5\' 5"  (1.651 m)    Wt 59 kg    SpO2 99%    BMI 21.63 kg/m   Physical Exam Vitals and nursing note reviewed.  Constitutional:      General: She is not in acute distress.    Appearance: She is not ill-appearing, toxic-appearing or diaphoretic.  HENT:     Head: Normocephalic and atraumatic.  Eyes:     General: No scleral icterus.       Right eye: No discharge.        Left eye: No discharge.  Cardiovascular:     Rate and Rhythm: Normal rate.     Pulses:          Carotid pulses are 3+ on the right side and 3+ on the left side.      Radial pulses are 3+ on the right side and 3+ on the left side.     Heart sounds: Normal heart sounds.  Pulmonary:     Effort: Pulmonary effort is normal. No tachypnea, bradypnea or  respiratory distress.     Breath sounds: Normal breath sounds. No stridor. No wheezing, rhonchi or rales.  Chest:     Chest wall: Tenderness present. No mass, deformity, swelling or crepitus.  Abdominal:     General: There is no distension.     Palpations: Abdomen is soft. There is no mass or pulsatile mass.     Tenderness: There is no abdominal tenderness.  Musculoskeletal:     Cervical back: Normal range of motion and neck supple.     Right lower leg: No edema.     Left lower leg: No edema.  Skin:  General: Skin is warm and dry.     Coloration: Skin is not jaundiced or pale.  Neurological:     General: No focal deficit present.     Mental Status: She is alert.  Psychiatric:        Behavior: Behavior is cooperative.     ED Results / Procedures / Treatments   Labs (all labs ordered are listed, but only abnormal results are displayed) Labs Reviewed  BASIC METABOLIC PANEL  CBC  TROPONIN I (HIGH SENSITIVITY)  TROPONIN I (HIGH SENSITIVITY)    EKG EKG Interpretation  Date/Time:  Saturday December 29 2020 16:50:18 EST Ventricular Rate:  80 PR Interval:    QRS Duration: 75 QT Interval:  455 QTC Calculation: 525 R Axis:   43 Text Interpretation: Sinus rhythm Probable anteroseptal infarct, recent Prolonged QT interval 12 Lead; [place]-Likar No significant change since last tracing Confirmed by Isla Pence [phone]) on 12/29/2020 7:37:58 PM   Radiology DG Chest 2 View  Result Date: 12/29/2020 CLINICAL DATA:  Chest pain for 3-4 days. EXAM: CHEST - 2 VIEW COMPARISON:  Radiograph 04/11/2015 FINDINGS: The cardiomediastinal contours are normal. Subsegmental atelectasis or scarring at the right lung base, unchanged. There are ill-defined subsegmental opacities at the left lung base that were not seen on prior. Pulmonary vasculature is normal. No consolidation, pleural effusion, or pneumothorax. No acute osseous abnormalities are seen. Scoliotic curvature of the spine. IMPRESSION:  Ill-defined subsegmental opacities at the left lung base, likely atelectasis, however or viral pneumonia can appear similarly. Electronically Signed   By: Keith Rake M.D.   On: 12/29/2020 18:12   CT Angio Chest PE W and/or Wo Contrast  Result Date: 12/29/2020 CLINICAL DATA:  Central chest pain for several days EXAM: CT ANGIOGRAPHY CHEST WITH CONTRAST TECHNIQUE: Multidetector CT imaging of the chest was performed using the standard protocol during bolus administration of intravenous contrast. Multiplanar CT image reconstructions and MIPs were obtained to evaluate the vascular anatomy. CONTRAST:  113mL OMNIPAQUE IOHEXOL 350 MG/ML SOLN COMPARISON:  None. FINDINGS: Cardiovascular: Thoracic aorta demonstrates atherosclerotic calcifications without aneurysmal dilatation or dissection. Pulmonary artery is within normal limits. No focal filling defect to suggest pulmonary embolism is noted. Mild coronary calcifications are noted. No pericardial effusion is seen. Mediastinum/Nodes: Thoracic inlet is within normal limits. No sizable hilar or mediastinal adenopathy is noted. The esophagus as visualized shows a small sliding-type hiatal hernia. Lungs/Pleura: Lungs are well aerated bilaterally. No focal infiltrate or sizable effusion is seen. No pneumothorax is noted. Small ground-glass nodule is noted measuring 5 mm in the medial aspect of the right lower lobe best seen on image number 93 of series 6. Mild right basilar atelectasis is noted. Changes in the left base likely represent scarring. Upper Abdomen: Visualized upper abdomen is within normal limits aside from the previously mentioned hiatal hernia. Musculoskeletal: Degenerative changes of the thoracic spine are noted. No acute bony abnormality is noted. Review of the MIP images confirms the above findings. IMPRESSION: No findings to suggest pulmonary embolism. Small 5 mm nodule in the medial right lower lobe. No follow-up needed if patient is low-risk.  Non-contrast chest CT can be considered in 12 months if patient is high-risk. This recommendation follows the consensus statement: Guidelines for Management of Incidental Pulmonary Nodules Detected on CT Images: From the Fleischner Society 2017; Radiology 2017; 284:228-243. Mild right basilar atelectasis.  Mild left basilar scarring. Small hiatal hernia. Aortic Atherosclerosis (ICD10-I70.0). Electronically Signed   By: Inez Catalina M.D.   On: 12/29/2020 21:15  Procedures Procedures   Medications Ordered in ED Medications  oxyCODONE-acetaminophen (PERCOCET/ROXICET) 5-325 MG per tablet 1 tablet (1 tablet Oral Given 12/29/20 2140)  iohexol (OMNIPAQUE) 350 MG/ML injection 100 mL (100 mLs Intravenous Contrast Given 12/29/20 2048)    ED Course  I have reviewed the triage vital signs and the nursing notes.  Pertinent labs & imaging results that were available during my care of the patient were reviewed by me and considered in my medical decision making (see chart for details).    MDM Rules/Calculators/A&P                          Alert 60 year old female in no acute distress, patient does appear uncomfortable.  Patient presents with chief complaint of central chest pain x3 days.  Heart score of 3.  Tenderness to palpation of anterior chest wall.  +3 pulses bilateral radial and carotid.  Normal rate and rhythm, no murmurs rubs or gallops.  Lungs clear to auscultation bilaterally.  No focal neurological deficits noted.  Patient denies any visual disturbance.  Patient denies any nausea or vomiting.  CBC unremarkable, BMP unremarkable.  Troponin 6 and 8; with delta of +2.  Chest x-ray showed no pneumothorax, no mediastinum widening, no consolidation; Ill-defined subsegmental opacities at the left lung base.    Based on patient's complaint of pleuritic chest pain chest CTA was ordered to evaluate for pulmonary emboli.  Chest CTA showed: 1) No findings to suggest pulmonary embolism.  2) Small 5 mm  nodule in the medial right lower lobe. No follow-up needed if patient is low-risk. Non-contrast chest CT can be  considered in 12 months if patient is high-risk. 3) Mild right basilar atelectasis. Mild left basilar scarring. 4) Small hiatal hernia. 5) Aortic Atherosclerosis   Based on patient's EKG and troponin with heart score of 3; less concerning for ACS.  No mediastinal widening noted on chest x-ray, radial and carotid pulses +3 and equal, patient hemodynamically stable; less concerning for aortic dissection.  Patient's hypertension is asymptomatic, she has history of hypertension, noncompliant with medication; no concern for hypertensive emergency at this time.  Patient was given Percocet to help with pain management.  Patient was advised not to drive or operate heavy machinery for the next 24 hours.  Will discharge patient with follow-up to see primary care provider.  Will have primary care provider arrange follow-up CT imaging for lung nodule.  Discussed results, findings, treatment and follow up. Patient advised of return precautions. Patient verbalized understanding and agreed with plan.  Patient was discussed with and evaluated by Dr. Gilford Raid.   Final Clinical Impression(s) / ED Diagnoses Final diagnoses:  Precordial chest pain    Rx / DC Orders ED Discharge Orders    None       Loni Beckwith, PA-C 12/30/20 1610    Isla Pence, MD 12/30/20 1315

## 2020-12-31 ENCOUNTER — Ambulatory Visit (INDEPENDENT_AMBULATORY_CARE_PROVIDER_SITE_OTHER): Payer: Medicare Other | Admitting: Physician Assistant

## 2020-12-31 ENCOUNTER — Ambulatory Visit (INDEPENDENT_AMBULATORY_CARE_PROVIDER_SITE_OTHER): Payer: Medicare Other

## 2020-12-31 ENCOUNTER — Ambulatory Visit: Payer: Medicare Other | Admitting: Physician Assistant

## 2020-12-31 ENCOUNTER — Encounter: Payer: Self-pay | Admitting: Physician Assistant

## 2020-12-31 DIAGNOSIS — M25532 Pain in left wrist: Secondary | ICD-10-CM

## 2020-12-31 NOTE — Progress Notes (Signed)
HPI: [name] Hinton returns today almost 2 months status post left wrist fracture. She has been using a Velcro splint. She did have a fall recently on the ice but states she did not hurt her wrist during the fall. She is notes some pain of the ulnar side of the left wrist. Not really having pain over the radial portion of the wrist.  Physical exam: Left wrist she has tenderness over the distal styloid area. No significant tenderness over the distal radius. Is able to dorsiflex and volar flex the left wrist without significant pain. She also has good radial and ulnar deviation of the wrist without pain. Hand is well-perfused. There is palpable callus distal radius. No skin breakdown.   Radiographs: Left wrist 3 views: No acute fractures. Moderate callus formation at the distal radius fracture site. Overall position alignment remains unchanged. Slight shortening persists of the nonarticular portion of the distal radius. Avulsion off the ulnar styloid process.  Impression: Left wrist fracture  Plan: She'll work on gentle range of motion strengthening the wrist. She can discontinue the Velcro wrist splint at this point time. She'll follow-up with [place] as needed. Questions encouraged and answered. Did discuss with her it may take up to 6 months for pain totally resolve.

## 2021-01-10 ENCOUNTER — Institutional Professional Consult (permissible substitution): Payer: Medicare Other | Admitting: Pulmonary Disease

## 2021-06-17 ENCOUNTER — Ambulatory Visit: Payer: Medicare Other | Admitting: Orthopaedic Surgery

## 2021-06-26 ENCOUNTER — Other Ambulatory Visit: Payer: Self-pay

## 2021-06-26 ENCOUNTER — Ambulatory Visit (INDEPENDENT_AMBULATORY_CARE_PROVIDER_SITE_OTHER): Payer: Medicare Other | Admitting: Orthopaedic Surgery

## 2021-06-26 ENCOUNTER — Ambulatory Visit: Payer: Self-pay

## 2021-06-26 DIAGNOSIS — G8929 Other chronic pain: Secondary | ICD-10-CM

## 2021-06-26 DIAGNOSIS — M5442 Lumbago with sciatica, left side: Secondary | ICD-10-CM

## 2021-06-26 NOTE — Progress Notes (Signed)
+  Office Visit Note   Patient: [name] Hinton           Date of Birth: August 25, 1961           MRN: [id number] Visit Date: 06/26/2021              Requested by: No referring provider defined for this encounter. PCP: Vassie Moment, MD (Inactive)   Assessment & Plan: Visit Diagnoses:  1. Chronic left-sided low back pain with left-sided sciatica     Plan: I agree with setting her up for a repeat epidural steroid injection most likely to the left-sided L4-L5 versus L3-L4.  This can be done with Dr. Ernestina Patches again since she really had a good experience with him the last time.  I also talked her about physical therapy on her lumbar spine and upper back as well due to posture issues.  She agrees with trying a short course of physical therapy for spine they can help with her posture and design a home exercise program for her back.  Follow-Up Instructions: Return in about 6 weeks (around 08/07/2021).   Orders:  Orders Placed This Encounter  Procedures   XR Lumbar Spine 2-3 Views   No orders of the defined types were placed in this encounter.     Procedures: No procedures performed   Clinical Data: No additional findings.   Subjective: Chief Complaint  Patient presents with   Lower Back - Pain  The patient comes in today with acute flareup for several weeks of left-sided low back pain and radicular symptoms going down her left leg.  She has had this in the past.  In February 2021 she had a left-sided L4-L5 epidural steroid injection by Dr. Ernestina Patches that helped quite a bit.  She feels like it may be time for another injection.  She still describes low back pain more to the left side but some to the right but the radicular symptoms are only going down the left leg to her knee.  A previous MRI of her lumbar spine showed some degenerative lumbar scoliosis but there was significant foraminal stenosis to the left at L4-L5 but also at L3-L4.  She denies any change in bowel bladder function or  weakness in her legs.  HPI  Review of Systems There is currently listed no headache, chest pain, shortness of breath, fever, chills, nausea, vomiting  Objective: Vital Signs: There were no vitals taken for this visit.  Physical Exam She is alert and orient x3 and in no acute distress Ortho Exam On exam she does have excellent strength in her bilateral lower extremities and normal sensation.  There is subjective numbness and pain that radiates down the left thigh anterior and posterior.  There is pain with flexion extension of the lower lumbar spine bilaterally.  Both hips move smoothly and fluidly.  Her knee exam is normal in both knees. Specialty Comments:  No specialty comments available.  Imaging: XR Lumbar Spine 2-3 Views  Result Date: 06/26/2021 2 views of the lumbar spine show significant lumbar degenerative scoliosis that has not changed when compared to previous films.    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   Low back pain 09/24/2011   Restless legs syndrome (RLS) 03/24/2011   Depressive disorder, not elsewhere classified 09/17/2010   Bipolar I disorder, single manic episode ([employer]) 08/20/2010   Benign essential hypertension 05/14/2010   Hyperlipemia 05/14/2010   Osteopenia 12/03/2009  Obsessive-compulsive disorder 10/17/2009   Past Medical History:  Diagnosis Date   Anxiety    Bipolar affective ([facility])    Chronic headaches    Depression    Hypercholesteremia    Hypertension    Restless leg syndrome     Family History  Problem Relation Age of Onset   Breast cancer Neg Hx     Past Surgical History:  Procedure Laterality Date   ABDOMINAL HYSTERECTOMY     OOPHORECTOMY     TONSILLECTOMY     Social History   Occupational History   Not on file  Tobacco Use   Smoking status: Some Days   Smokeless tobacco: Never  Substance and Sexual Activity   Alcohol use: No   Drug use: No   Sexual  activity: Yes    Birth control/protection: Surgical

## 2021-07-24 ENCOUNTER — Emergency Department (HOSPITAL_COMMUNITY)
Admission: EM | Admit: 2021-07-24 | Discharge: 2021-07-24 | Disposition: A | Discharge status: Home / Self Care | Payer: Medicare Other | Source: Home / Self Care | Attending: Emergency Medicine | Admitting: Emergency Medicine

## 2021-07-24 ENCOUNTER — Encounter (HOSPITAL_COMMUNITY): Payer: Self-pay

## 2021-07-24 DIAGNOSIS — F17200 Nicotine dependence, unspecified, uncomplicated: Secondary | ICD-10-CM | POA: Insufficient documentation

## 2021-07-24 DIAGNOSIS — I10 Essential (primary) hypertension: Secondary | ICD-10-CM | POA: Insufficient documentation

## 2021-07-24 DIAGNOSIS — N611 Abscess of the breast and nipple: Secondary | ICD-10-CM | POA: Diagnosis not present

## 2021-07-24 DIAGNOSIS — L02213 Cutaneous abscess of chest wall: Secondary | ICD-10-CM | POA: Insufficient documentation

## 2021-07-24 DIAGNOSIS — Z79899 Other long term (current) drug therapy: Secondary | ICD-10-CM | POA: Insufficient documentation

## 2021-07-24 DIAGNOSIS — N61 Mastitis without abscess: Secondary | ICD-10-CM | POA: Diagnosis not present

## 2021-07-24 MED ORDER — ACETAMINOPHEN 500 MG PO TABS
1000.0000 mg | ORAL_TABLET | Freq: Once | ORAL | Status: AC
  Administered 2021-07-24: 1000 mg via ORAL
  Filled 2021-07-24: qty 2

## 2021-07-24 MED ORDER — SULFAMETHOXAZOLE-TRIMETHOPRIM 800-160 MG PO TABS: 1.0000 | ORAL_TABLET | Freq: Two times a day (BID) | ORAL | 0 refills | Status: DC

## 2021-07-24 MED ORDER — LIDOCAINE-EPINEPHRINE (PF) 2 %-1:200000 IJ SOLN
10.0000 mL | Freq: Once | INTRAMUSCULAR | Status: AC
  Administered 2021-07-24: 10 mL
  Filled 2021-07-24: qty 20

## 2021-07-24 NOTE — ED Provider Notes (Signed)
Emergency Medicine Provider Triage Evaluation Note  [name] Hinton , a 60 y.o. female  was evaluated in triage.  Pt complains of breast pain.  Patient states she began experiencing a rash along the breast about 1 week ago.  She had a pustule to the left breast that her mother had been draining with a needle.  She recently began experiencing a tender point of pain and swelling just superior to this along the left breast.  Physical Exam  BP (!) 173/98 (BP Location: Right Arm)   Pulse 78   Temp 98.6 F (37 C) (Oral)   Resp 14   SpO2 100%  Gen:   Awake, no distress   Resp:  Normal effort  MSK:   Moves extremities without difficulty  Other:    Medical Decision Making  Medically screening exam initiated at 10:01 AM.  Appropriate orders placed.  [name] Hinton was informed that the remainder of the evaluation will be completed by another provider, this initial triage assessment does not replace that evaluation, and the importance of remaining in the ED until their evaluation is complete.   Rayna Sexton, PA-C 07/24/21 1002    Hayden Rasmussen, MD 07/24/21 1755

## 2021-07-24 NOTE — ED Triage Notes (Signed)
Pt arrived via POV, c/o abscess on left breast that she noticed yesterday. No drainage noted.

## 2021-07-24 NOTE — Discharge Instructions (Addendum)
Please take your antibiotic twice daily and follow-up with your PCP later this week for wound reevaluation.  Recommend Tylenol and NSAIDs for pain control.

## 2021-07-24 NOTE — ED Provider Notes (Addendum)
[facility] DEPT Provider Note   CSN: [id number] Arrival date & time: 07/24/21  [id number]     History Chief Complaint  Patient presents with   Abscess    [name] Hinton is a 60 y.o. female.   Abscess Associated symptoms: no fever and no vomiting    60 year old female presenting with concern for possible chest wall/breast abscess.  The patient states that she developed what she described as a "heat rash" along her left breast/sternum.  She had a small pustule that appeared along her left breast that her mother who is a former nurse began draining with a needle that it been sterilized with hydrogen peroxide.  She developed worsening tenderness at that site and swelling along the left breast and chest wall over the past 24 hours and presented to the emergency department for further care management.  She denies any fevers or chills.  On chart review, the patient's last tetanus was in 2014.  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     Family History  Problem Relation Age of Onset   Breast cancer Neg Hx     Social History   Tobacco Use   Smoking status: Some Days   Smokeless tobacco: Never  Substance Use Topics   Alcohol use: No    Drug use: No    Home Medications Prior to Admission medications   Medication Sig Start Date End Date Taking? Authorizing Provider  sulfamethoxazole-trimethoprim (BACTRIM DS) 800-160 MG tablet Take 1 tablet by mouth 2 (two) times daily for 7 days. 07/24/21 07/31/21 Yes Regan Lemming, MD  b complex vitamins tablet Take 1 tablet by mouth daily.    [provider]  buPROPion (WELLBUTRIN XL) 150 MG 24 hr tablet Take 150 mg by mouth every morning.    [provider]  calcium carbonate (OS-CAL - DOSED IN MG OF ELEMENTAL CALCIUM) 1250 MG tablet Take 1 tablet by mouth daily.    [provider]  cholecalciferol (VITAMIN D) 1000 UNITS tablet Take 1,000 Units by mouth daily.    [provider]  clonazePAM (KLONOPIN) 0.5 MG tablet Take 0.5 mg by mouth 2 (two) times daily as needed. 09/14/20   [provider]  gabapentin (NEURONTIN) 300 MG capsule Take 300 mg by mouth 3 (three) times daily.    [provider]  HYDROcodone-acetaminophen (NORCO/VICODIN) 5-325 MG tablet Take 1 tablet by mouth every 6 (six) hours as needed for moderate pain. 10/17/20   Pete Pelt, PA-C  lamoTRIgine (LAMICTAL) 100 MG tablet Take 100 mg by mouth 2 (two) times daily. 08/03/20   [provider]  losartan-hydrochlorothiazide (HYZAAR) 100-12.5 MG tablet Take 1 tablet by mouth  daily.    [provider]  metoprolol succinate (TOPROL-XL) 100 MG 24 hr tablet Take 100 mg by mouth every morning. Take with or immediately following a meal.    [provider]  Multiple Vitamin (MULTIVITAMIN WITH MINERALS) TABS Take 1 tablet by mouth every morning.    [provider]  omeprazole (PRILOSEC) 40 MG capsule Take 40 mg by mouth every morning.     [provider]  QUEtiapine (SEROQUEL) 200 MG tablet Take 200 mg by mouth at bedtime.    [provider]  rOPINIRole (REQUIP) 0.25 MG tablet Take 0.25 mg by mouth at bedtime. At 4pm    [provider]  simvastatin (ZOCOR) 20 MG tablet Take 20 mg by mouth at bedtime. 10/01/20   [provider]  tiZANidine (ZANAFLEX) 4 MG tablet TAKE 1 TABLET (4 MG TOTAL) BY MOUTH EVERY 8 (EIGHT) HOURS AS NEEDED FOR MUSCLE SPASMS. 10/16/20   Mcarthur Rossetti, MD    Allergies    Patient has no known allergies.  Review of Systems   Review of Systems  Constitutional:  Negative for chills and fever.  HENT:  Negative for ear pain and sore throat.   Eyes:  Negative for pain and visual disturbance.  Respiratory:  Negative for cough and shortness of breath.   Cardiovascular:  Negative for chest pain and palpitations.  Gastrointestinal:  Negative for abdominal pain and vomiting.  Genitourinary:  Negative for dysuria and hematuria.  Musculoskeletal:  Negative for arthralgias and back pain.  Skin:  Positive for color change and rash.  Neurological:  Negative for seizures and syncope.  All other systems reviewed and are negative.  Physical Exam Updated Vital Signs BP (!) 173/98 (BP Location: Right Arm)   Pulse 78   Temp 98.6 F (37 C) (Oral)   Resp 14   SpO2 100%   Physical Exam Vitals and nursing note reviewed. Exam conducted with a chaperone present.  Constitutional:      General: She is not in acute distress.    Appearance: She is well-developed.  HENT:     Head: Normocephalic and atraumatic.  Eyes:     Conjunctiva/sclera: Conjunctivae normal.  Cardiovascular:     Rate and Rhythm: Normal rate and regular rhythm.     Heart sounds: No murmur heard. Pulmonary:     Effort: Pulmonary effort is normal. No respiratory distress.     Breath sounds: Normal breath sounds.  Chest:     Comments: Small pustule along the anterior chest wall/sternum draining serous fluid, surrounding skin thickening, erythema, tenderness to palpation with no clear fluctuance Abdominal:     Palpations: Abdomen is soft.     Tenderness: There is no abdominal tenderness.  Musculoskeletal:      Cervical back: Neck supple.  Skin:    General: Skin is warm and dry.  Neurological:     General: No focal deficit present.     Mental Status: She is alert.     GCS: GCS eye subscore is 4. GCS verbal subscore is 5. GCS motor subscore is 6.    ED Results / Procedures / Treatments   Labs (all labs ordered are listed, but only abnormal results are displayed) Labs Reviewed - No data to display  EKG None  Radiology No results found.  Procedures .Marland KitchenIncision and Drainage  Date/Time: 07/24/2021 12:48 PM Performed by: Regan Lemming, MD Authorized by: Regan Lemming, MD   Consent:    Consent obtained:  Verbal   Consent given  by:  Patient   Risks discussed:  Bleeding, incomplete drainage, infection and pain   Alternatives discussed:  No treatment Universal protocol:    Patient identity confirmed:  Verbally with patient and arm band Location:    Type:  Abscess   Size:  1cm   Location:  Trunk   Trunk location:  Chest Pre-procedure details:    Skin preparation:  Chlorhexidine Sedation:    Sedation type:  None Anesthesia:    Anesthesia method:  Local infiltration   Local anesthetic:  Lidocaine 1% WITH epi Procedure type:    Complexity:  Simple Procedure details:    Needle aspiration: yes     Needle size:  25 G   Incision types:  Stab incision   Incision depth:  Subcutaneous   Wound management:  Probed and deloculated, irrigated with saline and extensive cleaning   Drainage:  Purulent and bloody   Drainage amount:  Moderate   Wound treatment:  Wound left open   Packing materials:  None Post-procedure details:    Procedure completion:  Tolerated   Medications Ordered in ED Medications  lidocaine-EPINEPHrine (XYLOCAINE W/EPI) 2 %-1:200000 (PF) injection 10 mL (has no administration in time range)  acetaminophen (TYLENOL) tablet 1,000 mg (has no administration in time range)    ED Course  I have reviewed the triage vital signs and the nursing notes.  Pertinent labs &  imaging results that were available during my care of the patient were reviewed by me and considered in my medical decision making (see chart for details).    MDM Rules/Calculators/A&P                           8-year-old female presenting to the emergency department with concern for a chest wall abscess.  The patient had been attempting home drainage with a needle.  Tetanus up-to-date.  Patient presents with 24 hours of worsening redness, swelling, induration of the skin.  Incision and drainage was performed with a moderate amount of bloody and purulent drainage noted.  The wound was probed and deloculated and subsequently irrigated with normal saline.  The wound was left open and dressed with a gauze dressing.  The patient was prescribed antibiotics and advised to follow-up with her PCP for recheck.  Patient was prescribed a course of Bactrim.  Return precautions were provided in the event of worsening symptoms.  Final Clinical Impression(s) / ED Diagnoses Final diagnoses:  Chest wall abscess    Rx / DC Orders ED Discharge Orders          Ordered    sulfamethoxazole-trimethoprim (BACTRIM DS) 800-160 MG tablet  2 times daily        07/24/21 1252             Regan Lemming, MD 07/24/21 1253    Regan Lemming, MD 07/24/21 1256

## 2021-07-25 ENCOUNTER — Encounter (HOSPITAL_COMMUNITY): Payer: Self-pay | Admitting: Emergency Medicine

## 2021-07-25 ENCOUNTER — Inpatient Hospital Stay (HOSPITAL_COMMUNITY)
Admission: EM | Admit: 2021-07-25 | Discharge: 2021-07-28 | DRG: 584 | Disposition: A | Discharge status: Home / Self Care | Payer: Medicare Other | Attending: Internal Medicine | Admitting: Internal Medicine

## 2021-07-25 ENCOUNTER — Other Ambulatory Visit: Payer: Self-pay

## 2021-07-25 DIAGNOSIS — I10 Essential (primary) hypertension: Secondary | ICD-10-CM | POA: Diagnosis present

## 2021-07-25 DIAGNOSIS — F17210 Nicotine dependence, cigarettes, uncomplicated: Secondary | ICD-10-CM | POA: Diagnosis present

## 2021-07-25 DIAGNOSIS — Z888 Allergy status to other drugs, medicaments and biological substances status: Secondary | ICD-10-CM | POA: Diagnosis not present

## 2021-07-25 DIAGNOSIS — N611 Abscess of the breast and nipple: Principal | ICD-10-CM

## 2021-07-25 DIAGNOSIS — G2581 Restless legs syndrome: Secondary | ICD-10-CM | POA: Diagnosis present

## 2021-07-25 DIAGNOSIS — F418 Other specified anxiety disorders: Secondary | ICD-10-CM

## 2021-07-25 DIAGNOSIS — E7800 Pure hypercholesterolemia, unspecified: Secondary | ICD-10-CM | POA: Diagnosis present

## 2021-07-25 DIAGNOSIS — F429 Obsessive-compulsive disorder, unspecified: Secondary | ICD-10-CM | POA: Diagnosis present

## 2021-07-25 DIAGNOSIS — F319 Bipolar disorder, unspecified: Secondary | ICD-10-CM | POA: Diagnosis present

## 2021-07-25 DIAGNOSIS — N61 Mastitis without abscess: Secondary | ICD-10-CM | POA: Diagnosis present

## 2021-07-25 DIAGNOSIS — Z20822 Contact with and (suspected) exposure to covid-19: Secondary | ICD-10-CM | POA: Diagnosis present

## 2021-07-25 DIAGNOSIS — L02213 Cutaneous abscess of chest wall: Secondary | ICD-10-CM | POA: Diagnosis present

## 2021-07-25 DIAGNOSIS — M8580 Other specified disorders of bone density and structure, unspecified site: Secondary | ICD-10-CM | POA: Diagnosis present

## 2021-07-25 DIAGNOSIS — Z79899 Other long term (current) drug therapy: Secondary | ICD-10-CM | POA: Diagnosis not present

## 2021-07-25 DIAGNOSIS — Z90710 Acquired absence of both cervix and uterus: Secondary | ICD-10-CM

## 2021-07-25 DIAGNOSIS — F419 Anxiety disorder, unspecified: Secondary | ICD-10-CM | POA: Diagnosis present

## 2021-07-25 DIAGNOSIS — G43A0 Cyclical vomiting, not intractable: Secondary | ICD-10-CM | POA: Diagnosis present

## 2021-07-25 LAB — RESP PANEL BY RT-PCR (FLU A&B, COVID) ARPGX2
Influenza A by PCR: NEGATIVE
Influenza B by PCR: NEGATIVE
SARS Coronavirus 2 by RT PCR: NEGATIVE

## 2021-07-25 LAB — URINALYSIS, ROUTINE W REFLEX MICROSCOPIC
Bilirubin Urine: NEGATIVE
Glucose, UA: NEGATIVE mg/dL
Hgb urine dipstick: NEGATIVE
Ketones, ur: 5 mg/dL — AB
Leukocytes,Ua: NEGATIVE
Nitrite: NEGATIVE
Protein, ur: NEGATIVE mg/dL
Specific Gravity, Urine: 1.014 (ref 1.005–1.030)
pH: 5 (ref 5.0–8.0)

## 2021-07-25 LAB — COMPREHENSIVE METABOLIC PANEL
ALT: 13 U/L (ref 0–44)
AST: 17 U/L (ref 15–41)
Albumin: 4.5 g/dL (ref 3.5–5.0)
Alkaline Phosphatase: 87 U/L (ref 38–126)
Anion gap: 11 (ref 5–15)
BUN: 15 mg/dL (ref 6–20)
CO2: 26 mmol/L (ref 22–32)
Calcium: 9.8 mg/dL (ref 8.9–10.3)
Chloride: 103 mmol/L (ref 98–111)
Creatinine, Ser: 0.67 mg/dL (ref 0.44–1.00)
GFR, Estimated: >60 mL/min (ref >60)
Glucose, Bld: 100 mg/dL — ABNORMAL HIGH (ref 70–99)
Potassium: 3.3 mmol/L — ABNORMAL LOW (ref 3.5–5.1)
Sodium: 140 mmol/L (ref 135–145)
Total Bilirubin: 0.8 mg/dL (ref 0.3–1.2)
Total Protein: 7.9 g/dL (ref 6.5–8.1)

## 2021-07-25 LAB — CBC WITH DIFFERENTIAL/PLATELET
Abs Immature Granulocytes: 0.08 10*3/uL — ABNORMAL HIGH (ref 0.00–0.07)
Basophils Absolute: 0.1 10*3/uL (ref 0.0–0.1)
Basophils Relative: 0 %
Eosinophils Absolute: 0.1 10*3/uL (ref 0.0–0.5)
Eosinophils Relative: 0 %
HCT: 41.2 % (ref 36.0–46.0)
Hemoglobin: 13.8 g/dL (ref 12.0–15.0)
Immature Granulocytes: 1 %
Lymphocytes Relative: 10 %
Lymphs Abs: 1.3 10*3/uL (ref 0.7–4.0)
MCH: 30.5 pg (ref 26.0–34.0)
MCHC: 33.5 g/dL (ref 30.0–36.0)
MCV: 91.2 fL (ref 80.0–100.0)
Monocytes Absolute: 0.9 10*3/uL (ref 0.1–1.0)
Monocytes Relative: 7 %
Neutro Abs: 11 10*3/uL — ABNORMAL HIGH (ref 1.7–7.7)
Neutrophils Relative %: 82 %
Platelets: 415 10*3/uL — ABNORMAL HIGH (ref 150–400)
RBC: 4.52 MIL/uL (ref 3.87–5.11)
RDW: 12.6 % (ref 11.5–15.5)
WBC: 13.4 10*3/uL — ABNORMAL HIGH (ref 4.0–10.5)
nRBC: 0 % (ref 0.0–0.2)

## 2021-07-25 LAB — LACTIC ACID, PLASMA
Lactic Acid, Venous: 1.2 mmol/L (ref 0.5–1.9)
Lactic Acid, Venous: 1 mmol/L (ref 0.5–1.9)

## 2021-07-25 MED ORDER — ACETAMINOPHEN 650 MG RE SUPP: 650.0000 mg | Freq: Four times a day (QID) | RECTAL | Status: DC | PRN

## 2021-07-25 MED ORDER — ONDANSETRON HCL 4 MG PO TABS
4.0000 mg | ORAL_TABLET | Freq: Four times a day (QID) | ORAL | Status: DC | PRN
  Administered 2021-07-28: 4 mg via ORAL
  Filled 2021-07-25: qty 1

## 2021-07-25 MED ORDER — LACTATED RINGERS IV SOLN: INTRAVENOUS | Status: DC

## 2021-07-25 MED ORDER — BUPROPION HCL ER (XL) 150 MG PO TB24
150.0000 mg | ORAL_TABLET | Freq: Every morning | ORAL | Status: DC
  Administered 2021-07-26 – 2021-07-28 (×3): 150 mg via ORAL
  Filled 2021-07-25 (×2): qty 1

## 2021-07-25 MED ORDER — ONDANSETRON HCL 4 MG/2ML IJ SOLN
4.0000 mg | Freq: Once | INTRAMUSCULAR | Status: AC
  Administered 2021-07-25: 4 mg via INTRAVENOUS
  Filled 2021-07-25: qty 2

## 2021-07-25 MED ORDER — OXYCODONE-ACETAMINOPHEN 5-325 MG PO TABS
1.0000 | ORAL_TABLET | Freq: Once | ORAL | Status: AC
  Administered 2021-07-25: 1 via ORAL
  Filled 2021-07-25: qty 1

## 2021-07-25 MED ORDER — LOSARTAN POTASSIUM-HCTZ 100-12.5 MG PO TABS: 1.0000 | ORAL_TABLET | Freq: Every day | ORAL | Status: DC

## 2021-07-25 MED ORDER — HYDROCHLOROTHIAZIDE 12.5 MG PO CAPS
12.5000 mg | ORAL_CAPSULE | Freq: Every day | ORAL | Status: DC
  Administered 2021-07-25 – 2021-07-28 (×4): 12.5 mg via ORAL
  Filled 2021-07-25 (×4): qty 1

## 2021-07-25 MED ORDER — CLONAZEPAM 0.5 MG PO TABS: 0.5000 mg | ORAL_TABLET | Freq: Two times a day (BID) | ORAL | Status: DC | PRN

## 2021-07-25 MED ORDER — ONDANSETRON HCL 4 MG/2ML IJ SOLN: 4.0000 mg | Freq: Four times a day (QID) | INTRAMUSCULAR | Status: DC | PRN

## 2021-07-25 MED ORDER — LOSARTAN POTASSIUM 50 MG PO TABS
100.0000 mg | ORAL_TABLET | Freq: Every day | ORAL | Status: DC
  Administered 2021-07-26 – 2021-07-28 (×3): 100 mg via ORAL
  Filled 2021-07-25 (×2): qty 4
  Filled 2021-07-25 (×2): qty 2

## 2021-07-25 MED ORDER — SODIUM CHLORIDE 0.9 % IV BOLUS
1000.0000 mL | Freq: Once | INTRAVENOUS | Status: AC
  Administered 2021-07-25: 1000 mL via INTRAVENOUS

## 2021-07-25 MED ORDER — ACETAMINOPHEN 325 MG PO TABS
650.0000 mg | ORAL_TABLET | Freq: Four times a day (QID) | ORAL | Status: DC | PRN
  Administered 2021-07-26: 650 mg via ORAL
  Filled 2021-07-25: qty 2

## 2021-07-25 MED ORDER — METHYLPREDNISOLONE SODIUM SUCC 125 MG IJ SOLR
80.0000 mg | Freq: Once | INTRAMUSCULAR | Status: AC
  Administered 2021-07-25: 80 mg via INTRAVENOUS

## 2021-07-25 MED ORDER — DIPHENHYDRAMINE HCL 50 MG/ML IJ SOLN
50.0000 mg | Freq: Once | INTRAMUSCULAR | Status: AC
  Administered 2021-07-25: 50 mg via INTRAVENOUS

## 2021-07-25 MED ORDER — VANCOMYCIN HCL 1250 MG/250ML IV SOLN
1250.0000 mg | INTRAVENOUS | Status: DC
  Administered 2021-07-27: 1250 mg via INTRAVENOUS
  Filled 2021-07-25 (×2): qty 250

## 2021-07-25 MED ORDER — LAMOTRIGINE 100 MG PO TABS
100.0000 mg | ORAL_TABLET | Freq: Two times a day (BID) | ORAL | Status: DC
  Administered 2021-07-25 – 2021-07-28 (×6): 100 mg via ORAL
  Filled 2021-07-25 (×6): qty 1

## 2021-07-25 MED ORDER — VANCOMYCIN HCL IN DEXTROSE 1-5 GM/200ML-% IV SOLN
1000.0000 mg | Freq: Once | INTRAVENOUS | Status: AC
  Administered 2021-07-25: 1000 mg via INTRAVENOUS
  Filled 2021-07-25: qty 200

## 2021-07-25 MED ORDER — QUETIAPINE FUMARATE 100 MG PO TABS: 200.0000 mg | ORAL_TABLET | Freq: Every day | ORAL | Status: DC

## 2021-07-25 MED ORDER — METOPROLOL SUCCINATE ER 50 MG PO TB24
100.0000 mg | ORAL_TABLET | Freq: Every morning | ORAL | Status: DC
  Administered 2021-07-27 – 2021-07-28 (×2): 100 mg via ORAL
  Filled 2021-07-25 (×2): qty 2

## 2021-07-25 MED ORDER — HYDROCODONE-ACETAMINOPHEN 5-325 MG PO TABS
1.0000 | ORAL_TABLET | Freq: Four times a day (QID) | ORAL | Status: DC | PRN
  Administered 2021-07-26 – 2021-07-27 (×5): 1 via ORAL
  Filled 2021-07-25 (×6): qty 1

## 2021-07-25 MED ORDER — ROPINIROLE HCL 0.5 MG PO TABS
0.2500 mg | ORAL_TABLET | Freq: Every day | ORAL | Status: DC
  Administered 2021-07-25 – 2021-07-27 (×3): 0.25 mg via ORAL
  Filled 2021-07-25 (×3): qty 1

## 2021-07-25 MED ORDER — SIMVASTATIN 20 MG PO TABS
20.0000 mg | ORAL_TABLET | Freq: Every day | ORAL | Status: DC
  Administered 2021-07-25 – 2021-07-27 (×3): 20 mg via ORAL
  Filled 2021-07-25 (×3): qty 1

## 2021-07-25 NOTE — ED Notes (Signed)
BC x2 collected from bilateral forearms

## 2021-07-25 NOTE — Progress Notes (Signed)
Pharmacy Antibiotic Note  [name] Hinton is a 60 y.o. female admitted on 07/25/2021 with purulent cellulitis after worsening symptoms on outpatient Bactrim. Pharmacy has been consulted for vancomycin dosing.  Plan: Vancomycin 1000 mg IV now, then 1250 mg IV q24 hr (est AUC 489 based on SCr round to 0.8; Vd 0.72) Measure vancomycin AUC at steady state as indicated SCr q48 while on vanc   Height: '5\' 5"'$  (165.1 cm) Weight: 59 kg (130 lb 1.1 oz) IBW/kg (Calculated) : 57  Temp (24hrs), Avg:98.7 F (37.1 C), Min:98.7 F (37.1 C), Max:98.7 F (37.1 C)  Recent Labs  Lab 07/25/21 1804 07/25/21 2018  WBC 13.4*  --   CREATININE 0.67  --   LATICACIDVEN 1.2 1.0    Estimated Creatinine Clearance: 67.3 mL/min (by C-G formula based on SCr of 0.67 mg/dL).    No Known Allergies    Thank you for allowing pharmacy to be a part of this patient's care.  Azeem Poorman A 07/25/2021 9:07 PM

## 2021-07-25 NOTE — ED Provider Notes (Signed)
[facility] DEPT Provider Note   CSN: [id number] Arrival date & time: 07/25/21  1723     History Chief Complaint  Patient presents with   Abscess   Emesis    [name] Hinton is a 60 y.o. female with past medical history of hypertension that presents emerged department today for skin infection and vomiting.  Patient was seen yesterday for concerns for possible chest wall/breast abscess, did have it drained.  Patient was placed on Bactrim, states that she then went home and was feeling fine, states that she had taken 3 doses of Bactrim and then this morning started vomiting, states that she has not been able to keep anything down all day today.  Patient states that she think she vomited about 20 times.  Denies any abdominal pain or diarrhea.  Patient states that she did not feel nauseous when she took the first 2 doses of Bactrim, however after taking the third dose of Bactrim she started having persistent vomiting.  Denies any hematemesis.  Patient states that she has also noticed that when she went to bed last night she did have some erythema around her abscess, however when she woke up in the morning the erythema had spread significantly in addition to the pain.  Patient states that it has been continuing to spread this morning, almost reaching her nipple.  See picture.  Denies any fevers..  Patient states that she started developing this heat rash on Monday, states that she had a small pustule on her left breast that her mother poked with a sterilized needle.  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     Family History  Problem Relation Age of Onset   Breast cancer Neg Hx     Social History   Tobacco Use   Smoking status: Some Days   Smokeless tobacco: Never  Substance Use Topics   Alcohol use: No   Drug use: No    Home Medications Prior to Admission medications   Medication Sig Start Date End Date Taking? Authorizing Provider  b complex vitamins tablet Take 1 tablet by mouth daily.    [provider]  buPROPion (WELLBUTRIN XL) 150 MG 24 hr tablet Take 150 mg by mouth every morning.    [provider]  calcium carbonate (OS-CAL - DOSED IN MG OF ELEMENTAL CALCIUM) 1250 MG tablet Take 1 tablet by mouth daily.    [provider]  cholecalciferol (VITAMIN D) 1000 UNITS tablet Take 1,000 Units by mouth daily.    [provider]  clonazePAM (KLONOPIN) 0.5 MG tablet Take 0.5 mg by mouth 2 (two) times daily as needed.  09/14/20   [provider]  gabapentin (NEURONTIN) 300 MG capsule Take 300 mg by mouth 3 (three) times daily.    [provider]  HYDROcodone-acetaminophen (NORCO/VICODIN) 5-325 MG tablet Take 1 tablet by mouth every 6 (six) hours as needed for moderate pain. 10/17/20   Pete Pelt, PA-C  lamoTRIgine (LAMICTAL) 100 MG tablet Take 100 mg by mouth 2 (two) times daily. 08/03/20   [provider]  losartan-hydrochlorothiazide (HYZAAR) 100-12.5 MG tablet Take 1 tablet by mouth daily.    [provider]  metoprolol succinate (TOPROL-XL) 100 MG 24 hr tablet Take 100 mg by mouth every morning. Take with or immediately following a  meal.    [provider]  Multiple Vitamin (MULTIVITAMIN WITH MINERALS) TABS Take 1 tablet by mouth every morning.    [provider]  omeprazole (PRILOSEC) 40 MG capsule Take 40 mg by mouth every morning.     [provider]  QUEtiapine (SEROQUEL) 200 MG tablet Take 200 mg by mouth at bedtime.    [provider]  rOPINIRole (REQUIP) 0.25 MG tablet Take 0.25 mg by mouth at bedtime. At 4pm    [provider]  simvastatin (ZOCOR) 20 MG tablet Take 20 mg by mouth at bedtime. 10/01/20   [provider]  sulfamethoxazole-trimethoprim (BACTRIM DS) 800-160 MG tablet Take 1 tablet by mouth 2 (two) times daily for 7 days. 07/24/21 07/31/21  Regan Lemming, MD  tiZANidine (ZANAFLEX) 4 MG tablet TAKE 1 TABLET (4 MG TOTAL) BY MOUTH EVERY 8 (EIGHT) HOURS AS NEEDED FOR MUSCLE SPASMS. 10/16/20   Mcarthur Rossetti, MD    Allergies    Patient has no known allergies.  Review of Systems   Review of Systems  Constitutional:  Negative for chills, diaphoresis, fatigue and fever.  HENT:  Negative for congestion, sore throat and trouble swallowing.   Eyes:  Negative for pain and visual disturbance.  Respiratory:  Negative for cough, shortness of breath and wheezing.   Cardiovascular:  Negative for chest pain, palpitations and leg swelling.  Gastrointestinal:  Positive for nausea and vomiting. Negative for abdominal distention, abdominal pain and diarrhea.  Genitourinary:  Negative for difficulty urinating.  Musculoskeletal:  Negative for back pain, neck pain and neck stiffness.  Skin:  Positive for color change and wound. Negative for pallor.  Neurological:  Negative for dizziness, speech difficulty, weakness and headaches.  Psychiatric/Behavioral:  Negative for confusion.    Physical Exam Updated Vital Signs BP (!) 159/94   Pulse 85   Temp 98.7 F (37.1 C) (Oral)   Resp 18   Ht '5\' 5"'$  (1.651 m)   Wt 59 kg   SpO2 96%   BMI 21.64 kg/m   Physical  Exam Constitutional:      General: She is not in acute distress.    Appearance: Normal appearance. She is not ill-appearing, toxic-appearing or diaphoretic.  HENT:     Mouth/Throat:     Mouth: Mucous membranes are moist.     Pharynx: Oropharynx is clear.  Eyes:     General: No scleral icterus.    Extraocular Movements: Extraocular movements intact.     Pupils: Pupils are equal, round, and reactive to light.  Cardiovascular:     Rate and Rhythm: Normal rate and regular rhythm.     Pulses: Normal pulses.     Heart sounds: Normal heart sounds.  Pulmonary:     Effort: Pulmonary effort is normal. No respiratory distress.     Breath sounds:  Normal breath sounds. No stridor. No wheezing, rhonchi or rales.  Chest:     Chest wall: No tenderness.  Abdominal:     General: Abdomen is flat. There is no distension.     Palpations: Abdomen is soft.     Tenderness: There is no abdominal tenderness. There is no guarding or rebound.  Musculoskeletal:        General: No swelling or tenderness. Normal range of motion.     Cervical back: Normal range of motion and neck supple. No rigidity.     Right lower leg: No edema.     Left lower leg: No edema.  Skin:    General: Skin is warm and dry.     Capillary Refill: Capillary refill takes less than 2 seconds.     Coloration: Skin is not pale.     Findings: Erythema present.     Comments: Patient with induration to area but depicted in graphic, erythema surrounding this area extending to about 3 inches.  No nipple discharge or nipple pain.  No masses palpated.  No lymphadenopathy.  Neurological:     General: No focal deficit present.     Mental Status: She is alert and oriented to person, place, and time.  Psychiatric:        Mood and Affect: Mood normal.        Behavior: Behavior normal.     ED Results / Procedures / Treatments   Labs (all labs ordered are listed, but only abnormal results are displayed) Labs Reviewed  COMPREHENSIVE METABOLIC  PANEL - Abnormal; Notable for the following components:      Result Value   Potassium 3.3 (*)    Glucose, Bld 100 (*)    All other components within normal limits  CBC WITH DIFFERENTIAL/PLATELET - Abnormal; Notable for the following components:   WBC 13.4 (*)    Platelets 415 (*)    Neutro Abs 11.0 (*)    Abs Immature Granulocytes 0.08 (*)    All other components within normal limits  URINALYSIS, ROUTINE W REFLEX MICROSCOPIC - Abnormal; Notable for the following components:   Ketones, ur 5 (*)    All other components within normal limits  CULTURE, BLOOD (ROUTINE X 2)  CULTURE, BLOOD (ROUTINE X 2)  RESP PANEL BY RT-PCR (FLU A&B, COVID) ARPGX2  AEROBIC CULTURE W GRAM STAIN (SUPERFICIAL SPECIMEN)  LACTIC ACID, PLASMA  LACTIC ACID, PLASMA    EKG None  Radiology No results found.  Procedures Procedures   Medications Ordered in ED Medications  vancomycin (VANCOCIN) IVPB 1000 mg/200 mL premix (has no administration in time range)  sodium chloride 0.9 % bolus 1,000 mL (0 mLs Intravenous Stopped 07/25/21 2018)  ondansetron (ZOFRAN) injection 4 mg (4 mg Intravenous Given 07/25/21 1844)  oxyCODONE-acetaminophen (PERCOCET/ROXICET) 5-325 MG per tablet 1 tablet (1 tablet Oral Given 07/25/21 1845)    ED Course  I have reviewed the triage vital signs and the nursing notes.  Pertinent labs & imaging results that were available during my care of the patient were reviewed by me and considered in my medical decision making (see chart for details).    MDM Rules/Calculators/A&P                          [name] Hinton is a 60 y.o. female with past medical history of hypertension that presents emerged department today for skin infection and vomiting.  Appears as if patient has cellulitis, no areas of  fluctuance.  Infection appears to be spreading, with continuous vomiting and patient unable to tolerate p.o. patient in failing outpatient antibiotics, most likely needs admission.  Will obtain labs  and reassess.  I think vomiting could be due to pain, patient does not appear septic.  Does not meet SIRS criteria.  Patient states that she has tolerated Bactrim in the past without any complications.   Upon reassessment patient states that she feels slightly better, I did speak to pharmacy who recommends vancomycin at this time since her area was slightly purulent upon drainage.  Blood cultures ordered.   Patient admitted to Dr. Tonie Griffith.    The patient appears reasonably stabilized for admission considering the current resources, flow, and capabilities available in the ED at this time, and I doubt any other [facility] requiring further screening and/or treatment in the ED prior to admission.   Final Clinical Impression(s) / ED Diagnoses Final diagnoses:  Cellulitis of breast    Rx / DC Orders ED Discharge Orders     None        Alfredia Client, PA-C 07/25/21 2020    Valarie Merino, MD 07/31/21 2226

## 2021-07-25 NOTE — H&P (Addendum)
History and Physical    [name] Hinton [id number] DOB: 1961/01/13 DOA: 07/25/2021  PCP: Vassie Moment, MD (Inactive)   Patient coming from: Home  Chief Complaint: Worsening infection on chest  HPI: [name] Hinton is a 60 y.o. female with medical history significant for HTN, HLD, depression/anxiety who presents for evaluation of worsening infection on her left breast.  She also reports she had episode nausea and vomiting at home today.  She was seen yesterday in the emergency room and was diagnosed with a small abscess on the left medial breast that was drained in the emergency room.  She was placed on Bactrim and discharged home.  She reports she took 3 doses of the Bactrim since she left the ER but this morning proximately hour after taking Bactrim she had an episode of nausea and vomiting and has not been able to keep any food or liquid down since then.  She reports she has vomited multiple times since this morning.  Reports that the redness around the area of the abscess that was drained has increased in size since she went home yesterday.  She reports that the abscess started a few days ago after she had a small heat rash with a small firm bump in the area of the abscess.  She reports that her mother used a needle at home to try to drain the area and 2 days later the abscess became bigger and red and more painful.  She reports that they did heat and sterilized the needle before using it. She occasionally smokes having 2 to 3 cigarettes a week.  She occasionally has a glass of wine every few weeks.  Denies illicit drug use.  ED Course: Hemodynamically stable in the emergency room.  CBC and CMP are unremarkable.  Lactic acid level is 1.2. Placed on vancomycin in the emergency room.  Hospitalist services been asked to admit for further management  Review of Systems:  General: Denies weakness, fever, chills, weight loss, night sweats.  Denies dizziness.  Denies change in appetite HENT: Denies  head trauma, headache, denies change in hearing, tinnitus.  Denies nasal congestion or bleeding.  Denies sore throat, sores in mouth.  Denies difficulty swallowing Eyes: Denies blurry vision, pain in eye, drainage.  Denies discoloration of eyes. Neck: Denies pain.  Denies swelling.  Denies pain with movement. Cardiovascular: Denies chest pain, palpitations.  Denies edema.  Denies orthopnea Respiratory: Denies shortness of breath, cough.  Denies wheezing.  Denies sputum production Gastrointestinal: Reports nausea and vomiting.  Denies abdominal pain, swelling.  Denies diarrhea.  Denies melena.  Denies hematemesis. Musculoskeletal: Denies limitation of movement.  Denies deformity or swelling.  Denies pain.  Denies arthralgias or myalgias. Genitourinary: Denies pelvic pain.  Denies urinary frequency or hesitancy.  Denies dysuria.  Skin: Denies rash.  Denies petechiae, purpura, ecchymosis. Abscess and redness of left medial breast. Neurological: Denies syncope.  Denies seizure activity.  Denies paresthesia.  Denies slurred speech, drooping face.  Denies visual change. Psychiatric: Denies depression, anxiety.  Denies hallucinations.  Past Medical History:  Diagnosis Date   Anxiety    Bipolar affective ([place])    Chronic headaches    Depression    Hypercholesteremia    Hypertension    Restless leg syndrome     Past Surgical History:  Procedure Laterality Date   ABDOMINAL HYSTERECTOMY     OOPHORECTOMY     TONSILLECTOMY      Social History  reports that she has been smoking. She has never  used smokeless tobacco. She reports that she does not drink alcohol and does not use drugs.  No Known Allergies  Family History  Problem Relation Age of Onset   Breast cancer Neg Hx      Prior to Admission medications   Medication Sig Start Date End Date Taking? Authorizing Provider  b complex vitamins tablet Take 1 tablet by mouth daily.    [provider]  buPROPion (WELLBUTRIN XL) 150  MG 24 hr tablet Take 150 mg by mouth every morning.    [provider]  calcium carbonate (OS-CAL - DOSED IN MG OF ELEMENTAL CALCIUM) 1250 MG tablet Take 1 tablet by mouth daily.    [provider]  cholecalciferol (VITAMIN D) 1000 UNITS tablet Take 1,000 Units by mouth daily.    [provider]  clonazePAM (KLONOPIN) 0.5 MG tablet Take 0.5 mg by mouth 2 (two) times daily as needed. 09/14/20   [provider]  gabapentin (NEURONTIN) 300 MG capsule Take 300 mg by mouth 3 (three) times daily.    [provider]  HYDROcodone-acetaminophen (NORCO/VICODIN) 5-325 MG tablet Take 1 tablet by mouth every 6 (six) hours as needed for moderate pain. 10/17/20   Pete Pelt, PA-C  lamoTRIgine (LAMICTAL) 100 MG tablet Take 100 mg by mouth 2 (two) times daily. 08/03/20   [provider]  losartan-hydrochlorothiazide (HYZAAR) 100-12.5 MG tablet Take 1 tablet by mouth daily.    [provider]  metoprolol succinate (TOPROL-XL) 100 MG 24 hr tablet Take 100 mg by mouth every morning. Take with or immediately following a meal.    [provider]  Multiple Vitamin (MULTIVITAMIN WITH MINERALS) TABS Take 1 tablet by mouth every morning.    [provider]  omeprazole (PRILOSEC) 40 MG capsule Take 40 mg by mouth every morning.     [provider]  QUEtiapine (SEROQUEL) 200 MG tablet Take 200 mg by mouth at bedtime.    [provider]  rOPINIRole (REQUIP) 0.25 MG tablet Take 0.25 mg by mouth at bedtime. At 4pm    [provider]  simvastatin (ZOCOR) 20 MG tablet Take 20 mg by mouth at bedtime. 10/01/20   [provider]  sulfamethoxazole-trimethoprim (BACTRIM DS) 800-160 MG tablet Take 1 tablet by mouth 2 (two) times daily for 7 days. 07/24/21 07/31/21  Regan Lemming, MD  tiZANidine (ZANAFLEX) 4 MG tablet TAKE 1 TABLET (4 MG TOTAL) BY MOUTH EVERY 8 (EIGHT) HOURS AS NEEDED FOR MUSCLE SPASMS. 10/16/20   Mcarthur Rossetti, MD    Physical Exam: Vitals:   07/25/21 1900 07/25/21 1915 07/25/21 1930 07/25/21 1945  BP: (!) 162/92 (!) 163/87 (!) 149/80 (!) 159/94  Pulse: 86 82 79 85  Resp: '18 17 16 18  '$ Temp:      TempSrc:      SpO2: 97% 97% 96% 96%  Weight:      Height:        Constitutional: NAD, calm, comfortable Vitals:   07/25/21 1900 07/25/21 1915 07/25/21 1930 07/25/21 1945  BP: (!) 162/92 (!) 163/87 (!) 149/80 (!) 159/94  Pulse: 86 82 79 85  Resp: '18 17 16 18  '$ Temp:      TempSrc:      SpO2: 97% 97% 96% 96%  Weight:      Height:       General: WDWN, Alert and oriented x3.  Eyes: EOMI, PERRL, conjunctivae normal.  Sclera nonicteric HENT:  [place]/AT, external ears normal.  Nares patent without epistasis.  Mucous membranes are moist. Neck: Soft, normal range of motion, supple, no masses, Trachea midline Respiratory: clear to auscultation bilaterally, no wheezing, no crackles. Normal respiratory effort. No accessory muscle use.  Cardiovascular: Regular rate and rhythm, no murmurs / rubs / gallops. No extremity edema.  Abdomen: Soft, no tenderness, nondistended, no rebound or guarding.  No masses palpated. Bowel sounds normoactive Musculoskeletal: FROM. no cyanosis. No joint deformity upper and lower extremities. Normal muscle tone.  Skin: Warm, dry, intact no rashes, lesions, ulcers. No induration. Left medial lower breast with fluctuant firm 1 cm area with surrounding 3 inches of erythema. Brown purulent drainage expressed. Neurologic: CN 2-12 grossly intact.  Normal speech.  Moves all 4 extremities spontaneously Psychiatric: Normal judgment and insight.  Normal mood.    Labs on Admission: I have personally reviewed following labs and imaging studies  CBC: Recent Labs  Lab 07/25/21 1804  WBC 13.4*  NEUTROABS 11.0*  HGB 13.8  HCT 41.2  MCV 91.2  PLT 415*    Basic Metabolic Panel: Recent Labs  Lab 07/25/21 1804  NA 140  K 3.3*  CL 103  CO2 26  GLUCOSE 100*  BUN 15   CREATININE 0.67  CALCIUM 9.8    GFR: Estimated Creatinine Clearance: 67.3 mL/min (by C-G formula based on SCr of 0.67 mg/dL).  Liver Function Tests: Recent Labs  Lab 07/25/21 1804  AST 17  ALT 13  ALKPHOS 87  BILITOT 0.8  PROT 7.9  ALBUMIN 4.5    Urine analysis:    Component Value Date/Time   COLORURINE YELLOW 07/25/2021 1825   APPEARANCEUR CLEAR 07/25/2021 1825   LABSPEC 1.014 07/25/2021 1825   PHURINE 5.0 07/25/2021 1825   GLUCOSEU NEGATIVE 07/25/2021 1825   HGBUR NEGATIVE 07/25/2021 1825   BILIRUBINUR NEGATIVE 07/25/2021 1825   KETONESUR 5 (A) 07/25/2021 1825   PROTEINUR NEGATIVE 07/25/2021 1825   UROBILINOGEN 1.0 08/05/2011 1045   NITRITE NEGATIVE 07/25/2021 1825   LEUKOCYTESUR NEGATIVE 07/25/2021 1825    Radiological Exams on Admission: No results found.   Assessment/Plan Principal Problem:   Cellulitis of left breast [name] Hinton is admitted to Med-Surg floor.  Started on vancomycin for antibiotic coverage. Blood cultures obtained in the emergency room and will be monitored. Oxycodone/acetaminophen as needed for moderate pain. Recheck CBC, BMP in morning  Active Problems:   Abscess of left breast Small abscess in the medial lower left breast with induration and fluctuation.  Small central opening from I&D yesterday.  Thick brownish drainage expressed with palpation.  Culture obtained of purulent drainage.    Benign essential hypertension Continue home medication of Hyzaar and metoprolol    Depression with anxiety Continue home medications.    DVT prophylaxis: [place] score low. TED hose and early ambulation.   Code Status:   Full Code  Family Communication:  Diagnosis and plan discussed with patient and she verbalized understanding.  She agrees with plan.  Further recommendations to follow as clinically indicated Disposition Plan:   Patient is from:  Home  Anticipated DC to:  Home  Anticipated DC date:  Anticipate 2 or more midnight  stay     Admission status:  Inpatient   Yevonne Aline Varnika Butz MD [facility]  How to contact the [facility] Attending or Consulting provider [place] or covering provider during after hours [place], for this patient?   Check the care team in [facility] and look for a) attending/consulting [facility] provider listed and b) the [facility] team listed Log into [email or web address] and use  [place]'s universal password to access. If you do not have the password, please contact the hospital operator. Locate the [facility] provider you are looking for under [facility] and page to a number that you can be directly reached. If you still have difficulty reaching the provider, please page the [facility] (Director on Call) for the Hospitalists listed on amion for assistance.  07/25/2021, 8:59 PM     Addendum: Pt developed itching 10 minutes after vancomycin finished infusing. Given benadryl and solumedrol IV once. Continue to monitor

## 2021-07-25 NOTE — Progress Notes (Signed)
A consult was received from an ED physician for vancomycin per pharmacy dosing (for an indication other than meningitis). The patient's profile has been reviewed for ht/wt/allergies/indication/available labs. A one time order has been placed for the above antibiotics.  Further antibiotics/pharmacy consults should be ordered by admitting physician if indicated.                       Reuel Boom, PharmD, BCPS [phone] 07/25/2021, 7:24 PM

## 2021-07-25 NOTE — ED Triage Notes (Signed)
Pt arrived via POV with c/o abscess on left breast. Pt was here yesterday for same and had it drained. Pt noticed today the redness around the abscess has spread. Also reports "feeling bad" and emesis x20 and not being able to tolerate anything PO.

## 2021-07-26 ENCOUNTER — Encounter (HOSPITAL_COMMUNITY): Payer: Self-pay | Admitting: Family Medicine

## 2021-07-26 ENCOUNTER — Inpatient Hospital Stay (HOSPITAL_COMMUNITY): Payer: Medicare Other

## 2021-07-26 DIAGNOSIS — N61 Mastitis without abscess: Secondary | ICD-10-CM | POA: Diagnosis not present

## 2021-07-26 LAB — BASIC METABOLIC PANEL
Anion gap: 7 (ref 5–15)
BUN: 12 mg/dL (ref 6–20)
CO2: 27 mmol/L (ref 22–32)
Calcium: 9.5 mg/dL (ref 8.9–10.3)
Chloride: 105 mmol/L (ref 98–111)
Creatinine, Ser: 0.57 mg/dL (ref 0.44–1.00)
GFR, Estimated: >60 mL/min (ref >60)
Glucose, Bld: 153 mg/dL — ABNORMAL HIGH (ref 70–99)
Potassium: 3.8 mmol/L (ref 3.5–5.1)
Sodium: 139 mmol/L (ref 135–145)

## 2021-07-26 LAB — CBC
HCT: 38.1 % (ref 36.0–46.0)
Hemoglobin: 12.7 g/dL (ref 12.0–15.0)
MCH: 30.2 pg (ref 26.0–34.0)
MCHC: 33.3 g/dL (ref 30.0–36.0)
MCV: 90.7 fL (ref 80.0–100.0)
Platelets: 359 10*3/uL (ref 150–400)
RBC: 4.2 MIL/uL (ref 3.87–5.11)
RDW: 12.5 % (ref 11.5–15.5)
WBC: 8.6 10*3/uL (ref 4.0–10.5)
nRBC: 0 % (ref 0.0–0.2)

## 2021-07-26 LAB — MRSA NEXT GEN BY PCR, NASAL: MRSA by PCR Next Gen: NOT DETECTED

## 2021-07-26 LAB — HIV ANTIBODY (ROUTINE TESTING W REFLEX): HIV Screen 4th Generation wRfx: NONREACTIVE

## 2021-07-26 MED ORDER — GABAPENTIN 300 MG PO CAPS
300.0000 mg | ORAL_CAPSULE | Freq: Three times a day (TID) | ORAL | Status: DC
  Administered 2021-07-26 – 2021-07-28 (×6): 300 mg via ORAL
  Filled 2021-07-26 (×6): qty 1

## 2021-07-26 MED ORDER — ASPIRIN EC 81 MG PO TBEC
81.0000 mg | DELAYED_RELEASE_TABLET | Freq: Every day | ORAL | Status: DC
  Administered 2021-07-26 – 2021-07-27 (×2): 81 mg via ORAL
  Filled 2021-07-26 (×2): qty 1

## 2021-07-26 MED ORDER — ENOXAPARIN SODIUM 40 MG/0.4ML IJ SOSY
40.0000 mg | PREFILLED_SYRINGE | INTRAMUSCULAR | Status: DC
  Administered 2021-07-26 – 2021-07-27 (×2): 40 mg via SUBCUTANEOUS
  Filled 2021-07-26 (×2): qty 0.4

## 2021-07-26 NOTE — Consult Note (Signed)
Consult Note  [name] Hinton May 26, 1961  [id number].    Requesting MD: Dr. Maylene Roes Chief Complaint/Reason for Consult: Breast abscess  HPI:  60 yo female with medical history significant for hypertension who presented to the [facility] ED due to ongoing pain and redness of left breast at site of abscess recently incised in the ED on 8/24. She and her mother had tried to drain abscess at home with needle prior to presenting to ED the first time. Abscess began as heat rash. After I&D which removed purulent material she was discharged on bactrim. Yesterday the redness and pain at the site had worsened and spread and she had developed nausea and emesis after her third dose of bactrim. ROS otherwise as below  WBC and BMP WNL. Ultrasound showing: 1.6 cm indeterminate superficial hypoechoic area over the 8-9 o'clock position of the left breast 9-10 cm from the nipple. This may represent a complex fluid collection/abscess versus complicated epidermal inclusion cyst or indeterminate solid mass  Substance use: a few cigarettes per week, occasional alcohol  ROS: Review of Systems  Constitutional:  Negative for chills and fever.  Respiratory:  Negative for cough and shortness of breath.   Cardiovascular:  Negative for chest pain and leg swelling.  Gastrointestinal:  Positive for nausea and vomiting. Negative for abdominal pain, constipation and diarrhea.  Genitourinary: Negative.   Skin:  Positive for rash.       abscess   Family History  Problem Relation Age of Onset   Breast cancer Neg Hx     Past Medical History:  Diagnosis Date   Anxiety    Bipolar affective ([place])    Chronic headaches    Depression    Hypercholesteremia    Hypertension    Restless leg syndrome     Past Surgical History:  Procedure Laterality Date   ABDOMINAL HYSTERECTOMY     OOPHORECTOMY     TONSILLECTOMY      Social History:  reports that she has been smoking. She has never used smokeless tobacco. She  reports that she does not drink alcohol and does not use drugs.  Allergies:  Allergies  Allergen Reactions   Quetiapine Other (See Comments)    Causes a lot of lethargy the following morning   Vancomycin Itching and Other (See Comments)    All-over, severe itching    Medications Prior to Admission  Medication Sig Dispense Refill   ARTIFICIAL TEARS PF 0.1-0.3 % SOLN Place 1 drop into both eyes 3 (three) times daily as needed (for dryness).     aspirin EC 81 MG tablet Take 81 mg by mouth at bedtime. Swallow whole.     Aspirin-Acetaminophen-Caffeine (GOODY HEADACHE PO) Take 1 packet by mouth as needed (for headaches).     buPROPion (WELLBUTRIN XL) 150 MG 24 hr tablet Take 150 mg by mouth every morning.     calcium carbonate (OS-CAL - DOSED IN MG OF ELEMENTAL CALCIUM) 1250 MG tablet Take 1 tablet by mouth daily.     clonazePAM (KLONOPIN) 0.5 MG tablet Take 0.5 mg by mouth 2 (two) times daily as needed for anxiety.     gabapentin (NEURONTIN) 300 MG capsule Take 300 mg by mouth 3 (three) times daily.     ibuprofen (ADVIL) 200 MG tablet Take 800 mg by mouth every 6 (six) hours as needed for headache or mild pain.     lamoTRIgine (LAMICTAL) 100 MG tablet Take 100 mg by mouth in the morning and at  bedtime.     losartan-hydrochlorothiazide (HYZAAR) 50-12.5 MG tablet Take 1 tablet by mouth daily.     Multiple Vitamin (MULTIVITAMIN WITH MINERALS) TABS Take 1 tablet by mouth every morning.     naproxen sodium (ALEVE) 220 MG tablet Take 220-440 mg by mouth 2 (two) times daily as needed (for mild pain or headaches).     omeprazole (PRILOSEC OTC) 20 MG tablet Take 20 mg by mouth daily as needed (for reflux).     rOPINIRole (REQUIP) 0.25 MG tablet Take 0.25 mg by mouth at bedtime.     simvastatin (ZOCOR) 20 MG tablet Take 20 mg by mouth at bedtime.     TYLENOL 8 HOUR 650 MG CR tablet Take 650-1,300 mg by mouth every 8 (eight) hours as needed for pain (or headaches).     losartan-hydrochlorothiazide  (HYZAAR) 100-12.5 MG tablet Take 1 tablet by mouth daily. (Patient not taking: No sig reported)     metoprolol succinate (TOPROL-XL) 100 MG 24 hr tablet Take 100 mg by mouth every morning. Take with or immediately following a meal. (Patient not taking: No sig reported)     QUEtiapine (SEROQUEL) 200 MG tablet Take 200 mg by mouth at bedtime. (Patient not taking: Reported on 07/25/2021)     tiZANidine (ZANAFLEX) 4 MG tablet TAKE 1 TABLET (4 MG TOTAL) BY MOUTH EVERY 8 (EIGHT) HOURS AS NEEDED FOR MUSCLE SPASMS. (Patient not taking: No sig reported) 60 tablet 3    Blood pressure (!) 151/83, pulse 76, temperature 98.6 F (37 C), temperature source Oral, resp. rate 15, height '5\' 5"'$  (1.651 m), weight 59 kg, SpO2 100 %. Physical Exam:  General: pleasant, WD, female who is laying in bed in NAD HEENT: head is normocephalic, atraumatic.  Sclera are noninjected.  PERRL.  Ears and nose without any masses or lesions.  Mouth is pink and moist Heart: Palpable radial pulses bilaterally Lungs: Respiratory effort nonlabored MS: all 4 extremities are symmetrical with no cyanosis, clubbing, or edema. Skin: warm and dry Medial left breast with eschar over site of recent I&D without active drainage or bleeding. Surrounding skin is erythematous and indurated especially over superior margin Neuro: Cranial nerves 2-12 grossly intact, sensation is normal throughout Psych: A&Ox3 with an appropriate affect.   Results for orders placed or performed during the hospital encounter of 07/25/21 (from the past 48 hour(s))  Comprehensive metabolic panel     Status: Abnormal   Collection Time: 07/25/21  6:04 PM  Result Value Ref Range   Sodium 140 135 - 145 mmol/L   Potassium 3.3 (L) 3.5 - 5.1 mmol/L   Chloride 103 98 - 111 mmol/L   CO2 26 22 - 32 mmol/L   Glucose, Bld 100 (H) 70 - 99 mg/dL    Comment: Glucose reference range applies only to samples taken after fasting for at least 8 hours.   BUN 15 6 - 20 mg/dL    Creatinine, Ser 0.67 0.44 - 1.00 mg/dL   Calcium 9.8 8.9 - 10.3 mg/dL   Total Protein 7.9 6.5 - 8.1 g/dL   Albumin 4.5 3.5 - 5.0 g/dL   AST 17 15 - 41 U/L   ALT 13 0 - 44 U/L   Alkaline Phosphatase 87 38 - 126 U/L   Total Bilirubin 0.8 0.3 - 1.2 mg/dL   GFR, Estimated >60 >60 mL/min    Comment: (NOTE) Calculated using the CKD-EPI Creatinine Equation (2021)    Anion gap 11 5 - 15    Comment: Performed at  [facility], [place] [address] [place], [place] [address]  CBC with Differential     Status: Abnormal   Collection Time: 07/25/21  6:04 PM  Result Value Ref Range   WBC 13.4 (H) 4.0 - 10.5 K/uL   RBC 4.52 3.87 - 5.11 MIL/uL   Hemoglobin 13.8 12.0 - 15.0 g/dL   HCT 41.2 36.0 - 46.0 %   MCV 91.2 80.0 - 100.0 fL   MCH 30.5 26.0 - 34.0 pg   MCHC 33.5 30.0 - 36.0 g/dL   RDW 12.6 11.5 - 15.5 %   Platelets 415 (H) 150 - 400 K/uL   nRBC 0.0 0.0 - 0.2 %   Neutrophils Relative % 82 %   Neutro Abs 11.0 (H) 1.7 - 7.7 K/uL   Lymphocytes Relative 10 %   Lymphs Abs 1.3 0.7 - 4.0 K/uL   Monocytes Relative 7 %   Monocytes Absolute 0.9 0.1 - 1.0 K/uL   Eosinophils Relative 0 %   Eosinophils Absolute 0.1 0.0 - 0.5 K/uL   Basophils Relative 0 %   Basophils Absolute 0.1 0.0 - 0.1 K/uL   Immature Granulocytes 1 %   Abs Immature Granulocytes 0.08 (H) 0.00 - 0.07 K/uL    Comment: Performed at [facility], [place] [address] [place], [place] [address]  Lactic acid, plasma     Status: None   Collection Time: 07/25/21  6:04 PM  Result Value Ref Range   Lactic Acid, Venous 1.2 0.5 - 1.9 mmol/L    Comment: Performed at [facility], [place] [address] [place], [place] [address]  Urinalysis, Routine w reflex microscopic Urine, Clean Catch     Status: Abnormal   Collection Time: 07/25/21  6:25 PM  Result Value Ref Range   Color, Urine YELLOW YELLOW   APPearance CLEAR CLEAR   Specific Gravity, Urine 1.014 1.005 - 1.030   pH 5.0 5.0 - 8.0    Glucose, UA NEGATIVE NEGATIVE mg/dL   Hgb urine dipstick NEGATIVE NEGATIVE   Bilirubin Urine NEGATIVE NEGATIVE   Ketones, ur 5 (A) NEGATIVE mg/dL   Protein, ur NEGATIVE NEGATIVE mg/dL   Nitrite NEGATIVE NEGATIVE   Leukocytes,Ua NEGATIVE NEGATIVE    Comment: Performed at [place] [address], [place], [place] [address]  Lactic acid, plasma     Status: None   Collection Time: 07/25/21  8:18 PM  Result Value Ref Range   Lactic Acid, Venous 1.0 0.5 - 1.9 mmol/L    Comment: Performed at [facility], [place] [address], [place], [place] [address]  Resp Panel by RT-PCR (Flu A&B, Covid) Nasopharyngeal Swab     Status: None   Collection Time: 07/25/21  8:18 PM   Specimen: Nasopharyngeal Swab; Nasopharyngeal(NP) swabs in vial transport medium  Result Value Ref Range   SARS Coronavirus 2 by RT PCR NEGATIVE NEGATIVE    Comment: (NOTE) SARS-CoV-2 target nucleic acids are NOT DETECTED.  The SARS-CoV-2 RNA is generally detectable in upper respiratory specimens during the acute phase of infection. The lowest concentration of SARS-CoV-2 viral copies this assay can detect is 138 copies/mL. A negative result does not preclude SARS-Cov-2 infection and should not be used as the sole basis for treatment or other patient management decisions. A negative result may occur with  improper specimen collection/handling, submission of specimen other than nasopharyngeal swab, presence of viral mutation(s) within the areas targeted by this assay, and inadequate number of viral copies(<138 copies/mL). A negative result  must be combined with clinical observations, patient history, and epidemiological information. The expected result is Negative.  Fact Sheet for Patients:  EntrepreneurPulse.com.au  Fact Sheet for Healthcare Providers:  IncredibleEmployment.be  This test is no t yet approved or cleared by the [place] FDA and   has been authorized for detection and/or diagnosis of SARS-CoV-2 by FDA under an Emergency Use Authorization (EUA). This EUA will remain  in effect (meaning this test can be used) for the duration of the COVID-19 declaration under Section 564(b)(1) of the Act, 21 U.S.C.section 360bbb-3(b)(1), unless the authorization is terminated  or revoked sooner.       Influenza A by PCR NEGATIVE NEGATIVE   Influenza B by PCR NEGATIVE NEGATIVE    Comment: (NOTE) The Xpert Xpress SARS-CoV-2/FLU/RSV plus assay is intended as an aid in the diagnosis of influenza from Nasopharyngeal swab specimens and should not be used as a sole basis for treatment. Nasal washings and aspirates are unacceptable for Xpert Xpress SARS-CoV-2/FLU/RSV testing.  Fact Sheet for Patients: EntrepreneurPulse.com.au  Fact Sheet for Healthcare Providers: IncredibleEmployment.be  This test is not yet approved or cleared by the [place] FDA and has been authorized for detection and/or diagnosis of SARS-CoV-2 by FDA under an Emergency Use Authorization (EUA). This EUA will remain in effect (meaning this test can be used) for the duration of the COVID-19 declaration under Section 564(b)(1) of the Act, 21 U.S.C. section 360bbb-3(b)(1), unless the authorization is terminated or revoked.  Performed at [facility], [place] [address] [place], [place] [address]   Aerobic Culture w Gram Stain (superficial specimen)     Status: None (Preliminary result)   Collection Time: 07/25/21  8:33 PM   Specimen: Abscess  Result Value Ref Range   Specimen Description      ABSCESS Performed at [facility] [address], [place], [place] [address]    Special Requests      NONE Performed at [facility], [place] [address] [place], [place] [address]    Gram Stain      FEW SQUAMOUS EPITHELIAL CELLS PRESENT FEW WBC PRESENT, PREDOMINANTLY  MONONUCLEAR MODERATE GRAM POSITIVE COCCI Performed at [facility], [place] [address] [place], [place] [address]    Culture PENDING    Report Status PENDING   HIV Antibody (routine testing w rflx)     Status: None   Collection Time: 07/25/21  9:48 PM  Result Value Ref Range   HIV Screen 4th Generation wRfx Non Reactive Non Reactive    Comment: Performed at [facility], [place] [address], [place], [place] [id number]  Basic metabolic panel     Status: Abnormal   Collection Time: 07/26/21  5:10 AM  Result Value Ref Range   Sodium 139 135 - 145 mmol/L   Potassium 3.8 3.5 - 5.1 mmol/L   Chloride 105 98 - 111 mmol/L   CO2 27 22 - 32 mmol/L   Glucose, Bld 153 (H) 70 - 99 mg/dL    Comment: Glucose reference range applies only to samples taken after fasting for at least 8 hours.   BUN 12 6 - 20 mg/dL   Creatinine, Ser 0.57 0.44 - 1.00 mg/dL   Calcium 9.5 8.9 - 10.3 mg/dL   GFR, Estimated >60 >60 mL/min    Comment: (NOTE) Calculated using the CKD-EPI Creatinine Equation (2021)    Anion gap 7 5 - 15    Comment: Performed at [facility], [place]  [address], [place], [place] [address]  CBC     Status: None   Collection Time: 07/26/21  5:10 AM  Result Value Ref Range   WBC 8.6 4.0 - 10.5 K/uL   RBC 4.20 3.87 - 5.11 MIL/uL   Hemoglobin 12.7 12.0 - 15.0 g/dL   HCT 38.1 36.0 - 46.0 %   MCV 90.7 80.0 - 100.0 fL   MCH 30.2 26.0 - 34.0 pg   MCHC 33.3 30.0 - 36.0 g/dL   RDW 12.5 11.5 - 15.5 %   Platelets 359 150 - 400 K/uL   nRBC 0.0 0.0 - 0.2 %    Comment: Performed at [facility], [place] [address], [place], [place] [address]   US BREAST LTD UNI LEFT INC AXILLA  Result Date: 07/26/2021 CLINICAL DATA:  History states assess for left breast abscess. Patient states history of previous abscess in this same area. No recent left breast imaging available for comparison. Exam performed via the emergency department as no radiologist present for the exam.  EXAM: ULTRASOUND OF THE LEFT BREAST COMPARISON:  Previous exam(s). FINDINGS: Patient was scanned over the area in question from the 7 to 9 o'clock position of the left breast 3-10 cm from the nipple. Targeted ultrasound is performed, showing a small irregular superficial hypoechoic area with slight through transmission over the 8-9 o'clock position 9-10 cm from the nipple. This extends to the skin with the skin is slightly thickened. This hypoechoic area measures 0.6 x 1.2 x 1.6 cm and demonstrates no internal vascularity. This is indeterminate and may represent a complex fluid collection such as abscess in keeping with patient's clinical history. Complicated epidermal inclusion cyst or solid mass is also possible. IMPRESSION: 1.6 cm indeterminate superficial hypoechoic area over the 8-9 o'clock position of the left breast 9-10 cm from the nipple. This may represent a complex fluid collection/abscess versus complicated epidermal inclusion cyst or indeterminate solid mass. RECOMMENDATION: If concern for infection, recommend 7-10 day course of antibiotics and attempted ultrasound-guided aspiration. If this does not aspirate, biopsy may be warranted in addition to bilateral diagnostic mammographic evaluation. I have discussed the findings and recommendations with the patient. If applicable, a reminder letter will be sent to the patient regarding the next appointment. BI-RADS CATEGORY  3: Probably benign. Electronically Signed   By: Marin Olp M.D.   On: 07/26/2021 13:56      Assessment/Plan Breast abscess S/p I&D in the ED 8/24 Nausea/emesis  Agree with admission and IV antibiotics given cellulitis and not tolerating PO. No further incision and drainage indicated at this time  Recommend warm compresses here and on discharge. Agree with radiology recommendations for 7-10 days of antibiotics and follow up with the [facility] for possible ultrasound-guided aspiration  Discussed  with patient the negative effects of tobacco use on abscess recurrence and wound healing  We will sign off but available as needed for any worsening or changes.  Winferd Humphrey, [facility] Surgery 07/26/2021, 2:39 PM Please see Amion for pager number during day hours 7:00am-4:30pm

## 2021-07-26 NOTE — ED Notes (Signed)
US at bedside

## 2021-07-26 NOTE — Discharge Instructions (Addendum)
  Home instructions  1. You will be discharged on an antibiotic. Please take all of your antibiotics until finished!   You may develop abdominal discomfort or diarrhea from the antibiotic.  You may help offset this with probiotics which you can buy or get in yogurt. Do not eat or take the probiotics until 2 hours after your antibiotic. Do not take your medicine if develop an itchy rash, swelling in your mouth or lips, or difficulty breathing.   2. Treatment: Keep wound clean and dry. Apply warm compresses to left breast throughout the day. It will continue to drain over the follow days.  For pain control you may take: '800mg'$  of ibuprofen (that is usually four '200mg'$  over the counter pills) up to 3 times a day (please take with food) and acetaminophen '975mg'$  (this is 3 normal strength, '325mg'$ , over the counter pills) up to four times a day. Please do not take more than this. Do not drink alcohol or combine with other medications that have acetaminophen as an ingredient (Read the labels!).    Follow Up:  Call to schedule follow up with the [facility]. Return to emergency department for emergent changing or worsening symptoms.  Return instructions:  Return to the Emergency Department if you have: Fever You have more redness, swelling, or pain around your incision.  Your incision feels warm to touch Redness of the skin that moves away from the affected area, especially if it streaks away from the affected area  The area where the incision and drainage occurred becomes numb or it tingles. Any other emergent concerns

## 2021-07-26 NOTE — Progress Notes (Signed)
PROGRESS NOTE    [name] Hinton  [id number] DOB: 02-24-1961 DOA: 07/25/2021 PCP: Vassie Moment, MD (Inactive)     Brief Narrative:  [name] Hinton is a 60 y.o. female with medical history significant for HTN, HLD, depression/anxiety who presents for evaluation of worsening infection on her left breast.  She also reports she had episode nausea and vomiting at home today.  She was seen 8/24 in the emergency room and was diagnosed with a small abscess on the left medial breast that was drained in the emergency room.  She was placed on Bactrim and discharged home.  She reports she took 3 doses of the Bactrim since she left the ER but this morning proximately hour after taking Bactrim she had an episode of nausea and vomiting and has not been able to keep any food or liquid down since then.  She reports she has vomited multiple times since this morning.  Reports that the redness around the area of the abscess that was drained has increased in size since she went home yesterday.  She reports that the abscess started a few days ago after she had a small heat rash with a small firm bump in the area of the abscess.  She reports that her mother used a needle at home to try to drain the area and 2 days later the abscess became bigger and red and more painful.  She reports that they did heat and sterilized the needle before using it.  Patient was started on vancomycin.  New events last 24 hours / Subjective: States that she feels that the abscess has grown in size overnight.  Erythema is about the same.  No fevers.  Has a migraine this morning.  Assessment & Plan:   Principal Problem:   Cellulitis of left breast Active Problems:   Benign essential hypertension   Depression with anxiety   Abscess of left breast   Cellulitis and abscess of the left breast -Wound culture obtained in the emergency department showing moderate gram-positive cocci -Started on vancomycin on admission -General surgery  consulted -Ultrasound revealed 1.6 cm indeterminate superficial area, represent complex fluid collection/abscess versus complicated epidermal inclusion cyst or indeterminate solid mass.  Recommended for 7 to 10 days of antibiotic treatment and attempted ultrasound-guided aspiration.  Hypertension -Continue Hyzaar, Toprol  Mood disorder -Continue Wellbutrin, Klonopin, Lamictal  Hyperlipidemia -Continue Zocor    DVT prophylaxis:  enoxaparin (LOVENOX) injection 40 mg Start: 07/26/21 1800 Place TED hose Start: 07/25/21 2138  Code Status:     Code Status Orders  (From admission, onward)           Start     Ordered   07/25/21 2138  Full code  Continuous        07/25/21 2137           Code Status History     This patient has a current code status but no historical code status.      Family Communication: None at bedside Disposition Plan:  Status is: Inpatient  Remains inpatient appropriate because:IV treatments appropriate due to intensity of illness or inability to take PO  Dispo: The patient is from: Home              Anticipated d/c is to: Home              Patient currently is not medically stable to d/c.   Difficult to place patient No      Consultants:  General surgery  Procedures:  None   Antimicrobials:  Anti-infectives (From admission, onward)    Start     Dose/Rate Route Frequency Ordered Stop   07/26/21 0600  vancomycin (VANCOREADY) IVPB 1250 mg/250 mL        1,250 mg 166.7 mL/hr over 90 Minutes Intravenous Every 24 hours 07/25/21 2106     07/25/21 1930  vancomycin (VANCOCIN) IVPB 1000 mg/200 mL premix        1,000 mg 200 mL/hr over 60 Minutes Intravenous  Once 07/25/21 1923 07/25/21 2120        Objective: Vitals:   07/26/21 0800 07/26/21 1000 07/26/21 1100 07/26/21 1414  BP: (!) 142/85 135/81 (!) 145/71 (!) 151/83  Pulse: 72 71 82 76  Resp: '16 16 15 15  '$ Temp:    98.6 F (37 C)  TempSrc:    Oral  SpO2: 94% 95% 100% 100%   Weight:      Height:        Intake/Output Summary (Last 24 hours) at 07/26/2021 1435 Last data filed at 07/26/2021 1400 Gross per 24 hour  Intake 320.08 ml  Output --  Net 320.08 ml   Filed Weights   07/25/21 1736  Weight: 59 kg    Examination:  General exam: Appears calm and comfortable  Respiratory system: Clear to auscultation. Respiratory effort normal. No respiratory distress. No conversational dyspnea.  Cardiovascular system: S1 & S2 heard, RRR. No murmurs. No pedal edema. Gastrointestinal system: Abdomen is nondistended, soft and nontender. Normal bowel sounds heard. Central nervous system: Alert and oriented. No focal neurological deficits. Speech clear.  Extremities: Symmetric in appearance  Skin: +Erythema involving left medial breast, does not extend outside of demarcated line.  Area of 2 inch indurated abscess Psychiatry: Judgement and insight appear normal. Mood & affect appropriate.   Data Reviewed: I have personally reviewed following labs and imaging studies  CBC: Recent Labs  Lab 07/25/21 1804 07/26/21 0510  WBC 13.4* 8.6  NEUTROABS 11.0*  --   HGB 13.8 12.7  HCT 41.2 38.1  MCV 91.2 90.7  PLT 415* [id number]   Basic Metabolic Panel: Recent Labs  Lab 07/25/21 1804 07/26/21 0510  NA 140 139  K 3.3* 3.8  CL 103 105  CO2 26 27  GLUCOSE 100* 153*  BUN 15 12  CREATININE 0.67 0.57  CALCIUM 9.8 9.5   GFR: Estimated Creatinine Clearance: 67.3 mL/min (by C-G formula based on SCr of 0.57 mg/dL). Liver Function Tests: Recent Labs  Lab 07/25/21 1804  AST 17  ALT 13  ALKPHOS 87  BILITOT 0.8  PROT 7.9  ALBUMIN 4.5   No results for input(s): LIPASE, AMYLASE in the last 168 hours. No results for input(s): AMMONIA in the last 168 hours. Coagulation Profile: No results for input(s): INR, PROTIME in the last 168 hours. Cardiac Enzymes: No results for input(s): CKTOTAL, CKMB, CKMBINDEX, TROPONINI in the last 168 hours. BNP (last 3 results) No results for  input(s): PROBNP in the last 8760 hours. HbA1C: No results for input(s): HGBA1C in the last 72 hours. CBG: No results for input(s): GLUCAP in the last 168 hours. Lipid Profile: No results for input(s): CHOL, HDL, LDLCALC, TRIG, CHOLHDL, LDLDIRECT in the last 72 hours. Thyroid Function Tests: No results for input(s): TSH, T4TOTAL, FREET4, T3FREE, THYROIDAB in the last 72 hours. Anemia Panel: No results for input(s): VITAMINB12, FOLATE, FERRITIN, TIBC, IRON, RETICCTPCT in the last 72 hours. Sepsis Labs: Recent Labs  Lab 07/25/21 1804 07/25/21 2018  LATICACIDVEN 1.2 1.0  Recent Results (from the past 240 hour(s))  Resp Panel by RT-PCR (Flu A&B, Covid) Nasopharyngeal Swab     Status: None   Collection Time: 07/25/21  8:18 PM   Specimen: Nasopharyngeal Swab; Nasopharyngeal(NP) swabs in vial transport medium  Result Value Ref Range Status   SARS Coronavirus 2 by RT PCR NEGATIVE NEGATIVE Final    Comment: (NOTE) SARS-CoV-2 target nucleic acids are NOT DETECTED.  The SARS-CoV-2 RNA is generally detectable in upper respiratory specimens during the acute phase of infection. The lowest concentration of SARS-CoV-2 viral copies this assay can detect is 138 copies/mL. A negative result does not preclude SARS-Cov-2 infection and should not be used as the sole basis for treatment or other patient management decisions. A negative result may occur with  improper specimen collection/handling, submission of specimen other than nasopharyngeal swab, presence of viral mutation(s) within the areas targeted by this assay, and inadequate number of viral copies(<138 copies/mL). A negative result must be combined with clinical observations, patient history, and epidemiological information. The expected result is Negative.  Fact Sheet for Patients:  EntrepreneurPulse.com.au  Fact Sheet for Healthcare Providers:  IncredibleEmployment.be  This test is no t yet  approved or cleared by the [place] FDA and  has been authorized for detection and/or diagnosis of SARS-CoV-2 by FDA under an Emergency Use Authorization (EUA). This EUA will remain  in effect (meaning this test can be used) for the duration of the COVID-19 declaration under Section 564(b)(1) of the Act, 21 U.S.C.section 360bbb-3(b)(1), unless the authorization is terminated  or revoked sooner.       Influenza A by PCR NEGATIVE NEGATIVE Final   Influenza B by PCR NEGATIVE NEGATIVE Final    Comment: (NOTE) The Xpert Xpress SARS-CoV-2/FLU/RSV plus assay is intended as an aid in the diagnosis of influenza from Nasopharyngeal swab specimens and should not be used as a sole basis for treatment. Nasal washings and aspirates are unacceptable for Xpert Xpress SARS-CoV-2/FLU/RSV testing.  Fact Sheet for Patients: EntrepreneurPulse.com.au  Fact Sheet for Healthcare Providers: IncredibleEmployment.be  This test is not yet approved or cleared by the [place] FDA and has been authorized for detection and/or diagnosis of SARS-CoV-2 by FDA under an Emergency Use Authorization (EUA). This EUA will remain in effect (meaning this test can be used) for the duration of the COVID-19 declaration under Section 564(b)(1) of the Act, 21 U.S.C. section 360bbb-3(b)(1), unless the authorization is terminated or revoked.  Performed at [facility], [place] [address], [place], [place] [address]   Aerobic Culture w Gram Stain (superficial specimen)     Status: None (Preliminary result)   Collection Time: 07/25/21  8:33 PM   Specimen: Abscess  Result Value Ref Range Status   Specimen Description   Final    ABSCESS Performed at [facility] [address] [place], [place] [address]    Special Requests   Final    NONE Performed at [facility], [place] [address] [place], [place] [address]    Gram Stain    Final    FEW SQUAMOUS EPITHELIAL CELLS PRESENT FEW WBC PRESENT, PREDOMINANTLY MONONUCLEAR MODERATE GRAM POSITIVE COCCI Performed at [facility], [place] [address], [place], [place] [address]    Culture PENDING  Incomplete   Report Status PENDING  Incomplete      Radiology Studies: US BREAST LTD UNI LEFT INC AXILLA  Result Date: 07/26/2021 CLINICAL DATA:  History states assess for left breast abscess. Patient states history  of previous abscess in this same area. No recent left breast imaging available for comparison. Exam performed via the emergency department as no radiologist present for the exam. EXAM: ULTRASOUND OF THE LEFT BREAST COMPARISON:  Previous exam(s). FINDINGS: Patient was scanned over the area in question from the 7 to 9 o'clock position of the left breast 3-10 cm from the nipple. Targeted ultrasound is performed, showing a small irregular superficial hypoechoic area with slight through transmission over the 8-9 o'clock position 9-10 cm from the nipple. This extends to the skin with the skin is slightly thickened. This hypoechoic area measures 0.6 x 1.2 x 1.6 cm and demonstrates no internal vascularity. This is indeterminate and may represent a complex fluid collection such as abscess in keeping with patient's clinical history. Complicated epidermal inclusion cyst or solid mass is also possible. IMPRESSION: 1.6 cm indeterminate superficial hypoechoic area over the 8-9 o'clock position of the left breast 9-10 cm from the nipple. This may represent a complex fluid collection/abscess versus complicated epidermal inclusion cyst or indeterminate solid mass. RECOMMENDATION: If concern for infection, recommend 7-10 day course of antibiotics and attempted ultrasound-guided aspiration. If this does not aspirate, biopsy may be warranted in addition to bilateral diagnostic mammographic evaluation. I have discussed the findings and recommendations with the patient. If applicable, a reminder  letter will be sent to the patient regarding the next appointment. BI-RADS CATEGORY  3: Probably benign. Electronically Signed   By: Marin Olp M.D.   On: 07/26/2021 13:56      Scheduled Meds:  buPROPion  150 mg Oral q morning   enoxaparin (LOVENOX) injection  40 mg Subcutaneous Q24H   hydrochlorothiazide  12.5 mg Oral Daily   lamoTRIgine  100 mg Oral BID   losartan  100 mg Oral Daily   metoprolol succinate  100 mg Oral q morning   rOPINIRole  0.25 mg Oral QHS   simvastatin  20 mg Oral QHS   Continuous Infusions:  lactated ringers Stopped (07/25/21 2313)   vancomycin       LOS: 1 day      Time spent: 40 minutes   Dessa Phi, DO [facility] 07/26/2021, 2:35 PM   Available via Epic secure chat 7am-7pm After these hours, please refer to coverage provider listed on [email or web address]

## 2021-07-27 DIAGNOSIS — N61 Mastitis without abscess: Secondary | ICD-10-CM | POA: Diagnosis not present

## 2021-07-27 LAB — CREATININE, SERUM
Creatinine, Ser: 0.75 mg/dL (ref 0.44–1.00)
GFR, Estimated: >60 mL/min (ref >60)

## 2021-07-27 MED ORDER — DIPHENHYDRAMINE HCL 50 MG/ML IJ SOLN: 25.0000 mg | Freq: Three times a day (TID) | INTRAMUSCULAR | Status: DC | PRN

## 2021-07-27 MED ORDER — DIPHENHYDRAMINE HCL 50 MG/ML IJ SOLN
25.0000 mg | Freq: Once | INTRAMUSCULAR | Status: AC
  Administered 2021-07-27: 25 mg via INTRAVENOUS
  Filled 2021-07-27: qty 1

## 2021-07-27 MED ORDER — DOXYCYCLINE HYCLATE 100 MG PO TABS
100.0000 mg | ORAL_TABLET | Freq: Two times a day (BID) | ORAL | Status: DC
  Administered 2021-07-28: 100 mg via ORAL
  Filled 2021-07-27: qty 1

## 2021-07-27 NOTE — Progress Notes (Signed)
/ PROGRESS NOTE    [name] Hinton  [id number] DOB: 08-Nov-1961 DOA: 07/25/2021 PCP: Vassie Moment, MD (Inactive)     Brief Narrative:  [name] Hinton is a 60 y.o. female with medical history significant for HTN, HLD, depression/anxiety who presents for evaluation of worsening infection on her left breast.  She also reports she had episode nausea and vomiting at home today.  She was seen 8/24 in the emergency room and was diagnosed with a small abscess on the left medial breast that was drained in the emergency room.  She was placed on Bactrim and discharged home.  She reports she took 3 doses of the Bactrim since she left the ER but this morning proximately hour after taking Bactrim she had an episode of nausea and vomiting and has not been able to keep any food or liquid down since then.  She reports she has vomited multiple times since this morning.  Reports that the redness around the area of the abscess that was drained has increased in size since she went home yesterday.  She reports that the abscess started a few days ago after she had a small heat rash with a small firm bump in the area of the abscess.  She reports that her mother used a needle at home to try to drain the area and 2 days later the abscess became bigger and red and more painful.  She reports that they did heat and sterilized the needle before using it.  Patient was started on vancomycin.  New events last 24 hours / Subjective: Still has significant pain at the site of the abscess.  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  Recommended for 7 to 10 days of antibiotic treatment and attempted ultrasound-guided aspiration. -Itching, rash reported with vancomycin.  De-escalate to doxycycline starting tomorrow.  Hypertension -Continue Hyzaar, Toprol  Mood disorder -Continue Wellbutrin, Klonopin, Lamictal  Hyperlipidemia -Continue Zocor    DVT prophylaxis:  enoxaparin (LOVENOX) injection 40 mg Start: 07/26/21 1800 Place TED hose Start: 07/25/21 2138  Code Status:     Code Status Orders  (From admission, onward)           Start     Ordered   07/25/21 2138  Full code  Continuous        07/25/21 2137           Code Status History     This patient has a current code status but no historical code status.      Family Communication: None at bedside Disposition Plan:  Status is: Inpatient  Remains inpatient appropriate because:Inpatient level of care appropriate due to severity of illness  Dispo: The patient is from: Home              Anticipated d/c is to: Home              Patient currently is not medically stable to d/c.   Difficult to place patient No      Consultants:  General  surgery  Procedures:  None   Antimicrobials:  Anti-infectives (From admission, onward)    Start     Dose/Rate Route Frequency Ordered Stop   07/26/21 0600  vancomycin (VANCOREADY) IVPB 1250 mg/250 mL        1,250 mg 166.7 mL/hr over 90 Minutes Intravenous Every 24 hours 07/25/21 2106     07/25/21 1930  vancomycin (VANCOCIN) IVPB 1000 mg/200 mL premix        1,000 mg 200 mL/hr over 60 Minutes Intravenous  Once 07/25/21 1923 07/25/21 2120        Objective: Vitals:   07/27/21 0553 07/27/21 0759 07/27/21 0804 07/27/21 0945  BP: 122/81 139/79  134/79  Pulse: 71 72  73  Resp: 16 16    Temp: 98.1 F (36.7 C)  98 F (36.7 C)   TempSrc: Oral Oral  Oral   SpO2: 98% 99%    Weight:      Height:        Intake/Output Summary (Last 24 hours) at 07/27/2021 1232 Last data filed at 07/27/2021 0956 Gross per 24 hour  Intake 1116.28 ml  Output 900 ml  Net 216.28 ml    Filed Weights   07/25/21 1736  Weight: 59 kg    Examination: General exam: Appears calm and comfortable  Respiratory system: Clear to auscultation. Respiratory effort normal. Cardiovascular system: S1 & S2 heard, RRR. No pedal edema. Gastrointestinal system: Abdomen is nondistended, soft and nontender. Normal bowel sounds heard. Central nervous system: Alert and oriented. Non focal exam. Speech clear  Extremities: Symmetric in appearance bilaterally  Skin: Erythema improving.  Abscess of the left medial breast is indurated and tender to palpation.  No fluctuance Psychiatry: Judgement and insight appear stable. Mood & affect appropriate.    Data Reviewed: I have personally reviewed following labs and imaging studies  CBC: Recent Labs  Lab 07/25/21 1804 07/26/21 0510  WBC 13.4* 8.6  NEUTROABS 11.0*  --   HGB 13.8 12.7  HCT 41.2 38.1  MCV 91.2 90.7  PLT 415* [id number]    Basic Metabolic Panel: Recent Labs  Lab 07/25/21 1804 07/26/21 0510 07/27/21 0006  NA 140 139  --   K 3.3* 3.8  --   CL 103 105  --   CO2 26 27  --   GLUCOSE 100* 153*  --   BUN 15 12  --   CREATININE 0.67 0.57 0.75  CALCIUM 9.8 9.5  --     GFR: Estimated Creatinine Clearance: 67.3 mL/min (by C-G formula based on SCr of 0.75 mg/dL). Liver Function Tests: Recent Labs  Lab 07/25/21 1804  AST 17  ALT 13  ALKPHOS 87  BILITOT 0.8  PROT 7.9  ALBUMIN 4.5    No results for input(s): LIPASE, AMYLASE in the last 168 hours. No results for input(s): AMMONIA in the last 168 hours. Coagulation Profile: No results for input(s): INR, PROTIME in the last 168 hours. Cardiac Enzymes: No results for input(s): CKTOTAL, CKMB, CKMBINDEX, TROPONINI in the last 168 hours. BNP (last 3  results) No results for input(s): PROBNP in the last 8760 hours. HbA1C: No results for input(s): HGBA1C in the last 72 hours. CBG: No results for input(s): GLUCAP in the last 168 hours. Lipid Profile: No results for input(s): CHOL, HDL, LDLCALC, TRIG, CHOLHDL, LDLDIRECT in the last 72 hours. Thyroid Function Tests: No results for input(s): TSH, T4TOTAL, FREET4, T3FREE, THYROIDAB in the last 72 hours. Anemia Panel: No results for input(s): VITAMINB12, FOLATE, FERRITIN, TIBC, IRON, RETICCTPCT  in the last 72 hours. Sepsis Labs: Recent Labs  Lab 07/25/21 1804 07/25/21 2018  LATICACIDVEN 1.2 1.0     Recent Results (from the past 240 hour(s))  Resp Panel by RT-PCR (Flu A&B, Covid) Nasopharyngeal Swab     Status: None   Collection Time: 07/25/21  8:18 PM   Specimen: Nasopharyngeal Swab; Nasopharyngeal(NP) swabs in vial transport medium  Result Value Ref Range Status   SARS Coronavirus 2 by RT PCR NEGATIVE NEGATIVE Final    Comment: (NOTE) SARS-CoV-2 target nucleic acids are NOT DETECTED.  The SARS-CoV-2 RNA is generally detectable in upper respiratory specimens during the acute phase of infection. The lowest concentration of SARS-CoV-2 viral copies this assay can detect is 138 copies/mL. A negative result does not preclude SARS-Cov-2 infection and should not be used as the sole basis for treatment or other patient management decisions. A negative result may occur with  improper specimen collection/handling, submission of specimen other than nasopharyngeal swab, presence of viral mutation(s) within the areas targeted by this assay, and inadequate number of viral copies(<138 copies/mL). A negative result must be combined with clinical observations, patient history, and epidemiological information. The expected result is Negative.  Fact Sheet for Patients:  EntrepreneurPulse.com.au  Fact Sheet for Healthcare Providers:   IncredibleEmployment.be  This test is no t yet approved or cleared by the [place] FDA and  has been authorized for detection and/or diagnosis of SARS-CoV-2 by FDA under an Emergency Use Authorization (EUA). This EUA will remain  in effect (meaning this test can be used) for the duration of the COVID-19 declaration under Section 564(b)(1) of the Act, 21 U.S.C.section 360bbb-3(b)(1), unless the authorization is terminated  or revoked sooner.       Influenza A by PCR NEGATIVE NEGATIVE Final   Influenza B by PCR NEGATIVE NEGATIVE Final    Comment: (NOTE) The Xpert Xpress SARS-CoV-2/FLU/RSV plus assay is intended as an aid in the diagnosis of influenza from Nasopharyngeal swab specimens and should not be used as a sole basis for treatment. Nasal washings and aspirates are unacceptable for Xpert Xpress SARS-CoV-2/FLU/RSV testing.  Fact Sheet for Patients: EntrepreneurPulse.com.au  Fact Sheet for Healthcare Providers: IncredibleEmployment.be  This test is not yet approved or cleared by the [place] FDA and has been authorized for detection and/or diagnosis of SARS-CoV-2 by FDA under an Emergency Use Authorization (EUA). This EUA will remain in effect (meaning this test can be used) for the duration of the COVID-19 declaration under Section 564(b)(1) of the Act, 21 U.S.C. section 360bbb-3(b)(1), unless the authorization is terminated or revoked.  Performed at [facility], [place] [address] [place], [place] [address]   Aerobic Culture w Gram Stain (superficial specimen)     Status: None (Preliminary result)   Collection Time: 07/25/21  8:33 PM   Specimen: Abscess  Result Value Ref Range Status   Specimen Description   Final    ABSCESS Performed at [facility] [address], [place], [place] [address]    Special Requests   Final    NONE Performed at [facility], [place] [address] [place], [place] [address]    Gram Stain   Final    FEW SQUAMOUS EPITHELIAL CELLS PRESENT FEW WBC PRESENT, PREDOMINANTLY MONONUCLEAR MODERATE GRAM POSITIVE COCCI Performed at [facility], [place] [address] [place], [place] [address]    Culture PENDING  Incomplete   Report Status PENDING  Incomplete  MRSA Next Gen by PCR, Nasal  Status: None   Collection Time: 07/26/21  2:31 PM   Specimen: Nasal Mucosa; Nasal Swab  Result Value Ref Range Status   MRSA by PCR Next Gen NOT DETECTED NOT DETECTED Final    Comment: (NOTE) The GeneXpert MRSA Assay (FDA approved for NASAL specimens only), is one component of a comprehensive MRSA colonization surveillance program. It is not intended to diagnose MRSA infection nor to guide or monitor treatment for MRSA infections. Test performance is not FDA approved in patients less than 62 years old. Performed at [facility], [place] [address] [place], [place] [address]        Radiology Studies: US BREAST LTD UNI LEFT INC AXILLA  Result Date: 07/26/2021 CLINICAL DATA:  History states assess for left breast abscess. Patient states history of previous abscess in this same area. No recent left breast imaging available for comparison. Exam performed via the emergency department as no radiologist present for the exam. EXAM: ULTRASOUND OF THE LEFT BREAST COMPARISON:  Previous exam(s). FINDINGS: Patient was scanned over the area in question from the 7 to 9 o'clock position of the left breast 3-10 cm from the nipple. Targeted ultrasound is performed, showing a small irregular superficial hypoechoic area with slight through transmission over the 8-9 o'clock position 9-10 cm from the nipple. This extends to the skin with the skin is slightly thickened. This hypoechoic area measures 0.6 x 1.2 x 1.6 cm and demonstrates no internal vascularity. This is indeterminate and may represent a complex fluid collection such as  abscess in keeping with patient's clinical history. Complicated epidermal inclusion cyst or solid mass is also possible. IMPRESSION: 1.6 cm indeterminate superficial hypoechoic area over the 8-9 o'clock position of the left breast 9-10 cm from the nipple. This may represent a complex fluid collection/abscess versus complicated epidermal inclusion cyst or indeterminate solid mass. RECOMMENDATION: If concern for infection, recommend 7-10 day course of antibiotics and attempted ultrasound-guided aspiration. If this does not aspirate, biopsy may be warranted in addition to bilateral diagnostic mammographic evaluation. I have discussed the findings and recommendations with the patient. If applicable, a reminder letter will be sent to the patient regarding the next appointment. BI-RADS CATEGORY  3: Probably benign. Electronically Signed   By: Marin Olp M.D.   On: 07/26/2021 13:56      Scheduled Meds:  aspirin EC  81 mg Oral QHS   buPROPion  150 mg Oral q morning   enoxaparin (LOVENOX) injection  40 mg Subcutaneous Q24H   gabapentin  300 mg Oral TID   hydrochlorothiazide  12.5 mg Oral Daily   lamoTRIgine  100 mg Oral BID   losartan  100 mg Oral Daily   metoprolol succinate  100 mg Oral q morning   rOPINIRole  0.25 mg Oral QHS   simvastatin  20 mg Oral QHS   Continuous Infusions:  vancomycin 1,250 mg (07/27/21 0534)     LOS: 2 days      Time spent: 20 minutes   Dessa Phi, DO [facility] 07/27/2021, 12:32 PM   Available via Epic secure chat 7am-7pm After these hours, please refer to coverage provider listed on [email or web address]

## 2021-07-28 DIAGNOSIS — N61 Mastitis without abscess: Secondary | ICD-10-CM | POA: Diagnosis not present

## 2021-07-28 MED ORDER — DOXYCYCLINE HYCLATE 100 MG PO TABS: 100.0000 mg | ORAL_TABLET | Freq: Two times a day (BID) | ORAL | 0 refills | Status: AC

## 2021-07-28 MED ORDER — ONDANSETRON 4 MG PO TBDP: 4.0000 mg | ORAL_TABLET | Freq: Three times a day (TID) | ORAL | 0 refills | Status: DC | PRN

## 2021-07-28 NOTE — Discharge Summary (Addendum)
Physician Discharge Summary  [name] Hinton [id number] DOB: 28-Jun-1961 DOA: 07/25/2021  PCP: Vassie Moment, MD (Inactive)  Admit date: 07/25/2021 Discharge date: 07/28/2021  Admitted From: home Disposition:  home  Recommendations for Outpatient Follow-up:  Follow up with PCP in 1 week Follow up with [facility] in 1 week   Discharge Condition: Stable CODE STATUS: Full  Diet recommendation:  Diet Orders (From admission, onward)     Start     Ordered   07/25/21 2138  Diet Heart Room service appropriate? Yes; Fluid consistency: Thin  Diet effective now       Question Answer Comment  Room service appropriate? Yes   Fluid consistency: Thin      07/25/21 2137           Brief/Interim Summary: [name] Hinton is a 60 y.o. female with medical history significant for HTN, HLD, depression/anxiety who presents for evaluation of worsening infection on her left breast.  She also reports she had episode nausea and vomiting at home today.  She was seen 8/24 in the emergency room and was diagnosed with a small abscess on the left medial breast that was drained in the emergency room.  She was placed on Bactrim and discharged home.  She reports she took 3 doses of the Bactrim since she left the ER but this morning proximately hour after taking Bactrim she had an episode of nausea and vomiting and has not been able to keep any food or liquid down since then.  She reports she has vomited multiple times since this morning.  Reports that the redness around the area of the abscess that was drained has increased in size since she went home yesterday.  She reports that the abscess started a few days ago after she had a small heat rash with a small firm bump in the area of the abscess.  She reports that her mother used a needle at home to try to drain the area and 2 days later the abscess became bigger and red and more painful.  She reports that they did heat and sterilized the needle before  using it.  Patient was started on vancomycin. General surgery was consulted who recommended [place]. Cellulitis and size of abscess continued to improve and patient was transitioned to doxycyline for discharge.   Discharge Diagnoses:  Principal Problem:   Cellulitis of left breast Active Problems:   Benign essential hypertension   Depression with anxiety   Abscess of left breast   Cellulitis and abscess of the left breast -Wound culture obtained in the emergency department showing moderate gram-positive cocci -Appreciate general surgery -Ultrasound revealed 1.6 cm indeterminate superficial area, represent complex fluid collection/abscess versus complicated epidermal inclusion cyst or indeterminate solid mass.  Recommended for 7 to 10 days of antibiotic treatment and attempted ultrasound-guided aspiration. -Itching, rash reported with vancomycin.  De-escalate to doxycycline for discharge.    Hypertension -Continue Hyzaar, Toprol   Mood disorder -Continue Wellbutrin, Klonopin, Lamictal  Hyperlipidemia -Continue Zocor     Discharge Instructions  Discharge Instructions     Call MD for:  difficulty breathing, headache or visual disturbances   Complete by: As directed    Call MD for:  extreme fatigue   Complete by: As directed    Call MD for:  hives   Complete by: As directed    Call MD for:  persistant dizziness or light-headedness   Complete by: As directed    Call MD for:  persistant nausea and  vomiting   Complete by: As directed    Call MD for:  severe uncontrolled pain   Complete by: As directed    Call MD for:  temperature >100.4   Complete by: As directed    Discharge instructions   Complete by: As directed    You were cared for by a hospitalist during your hospital stay. If you have any questions about your discharge medications or the care you received while you were in the hospital after you are discharged, you can call the unit and ask to speak with the hospitalist on  call if the hospitalist that took care of you is not available. Once you are discharged, your primary care physician will handle any further medical issues. Please note that NO REFILLS for any discharge medications will be authorized once you are discharged, as it is imperative that you return to your primary care physician (or establish a relationship with a primary care physician if you do not have one) for your aftercare needs so that they can reassess your need for medications and monitor your lab values.   Discharge wound care:   Complete by: As directed    Keep abscess site clean and dry. Do not soak. Do not attempt to open up on your own.   Increase activity slowly   Complete by: As directed       Allergies as of 07/28/2021       Reactions   Quetiapine Other (See Comments)   Causes a lot of lethargy the following morning   Vancomycin Itching, Other (See Comments)   All-over, severe itching        Medication List     STOP taking these medications    metoprolol succinate 100 MG 24 hr tablet Commonly known as: TOPROL-XL   QUEtiapine 200 MG tablet Commonly known as: SEROQUEL   tiZANidine 4 MG tablet Commonly known as: ZANAFLEX       TAKE these medications    Artificial Tears PF 0.1-0.3 % Soln Generic drug: Dextran 70-Hypromellose (PF) Place 1 drop into both eyes 3 (three) times daily as needed (for dryness).   aspirin EC 81 MG tablet Take 81 mg by mouth at bedtime. Swallow whole.   buPROPion 150 MG 24 hr tablet Commonly known as: WELLBUTRIN XL Take 150 mg by mouth every morning.   calcium carbonate 1250 (500 Ca) MG tablet Commonly known as: OS-CAL - dosed in mg of elemental calcium Take 1 tablet by mouth daily.   clonazePAM 0.5 MG tablet Commonly known as: KLONOPIN Take 0.5 mg by mouth 2 (two) times daily as needed for anxiety.   doxycycline 100 MG tablet Commonly known as: VIBRA-TABS Take 1 tablet (100 mg total) by mouth every 12 (twelve) hours for 7  days.   gabapentin 300 MG capsule Commonly known as: NEURONTIN Take 300 mg by mouth 3 (three) times daily.   GOODY HEADACHE PO Take 1 packet by mouth as needed (for headaches).   ibuprofen 200 MG tablet Commonly known as: ADVIL Take 800 mg by mouth every 6 (six) hours as needed for headache or mild pain.   lamoTRIgine 100 MG tablet Commonly known as: LAMICTAL Take 100 mg by mouth in the morning and at bedtime.   losartan-hydrochlorothiazide 50-12.5 MG tablet Commonly known as: HYZAAR Take 1 tablet by mouth daily. What changed: Another medication with the same name was removed. Continue taking this medication, and follow the directions you see here.   multivitamin with minerals Tabs tablet Take  1 tablet by mouth every morning.   naproxen sodium 220 MG tablet Commonly known as: ALEVE Take 220-440 mg by mouth 2 (two) times daily as needed (for mild pain or headaches).   omeprazole 20 MG tablet Commonly known as: PRILOSEC OTC Take 20 mg by mouth daily as needed (for reflux).   ondansetron 4 MG disintegrating tablet Commonly known as: Zofran ODT Take 1 tablet (4 mg total) by mouth every 8 (eight) hours as needed for nausea or vomiting.   rOPINIRole 0.25 MG tablet Commonly known as: REQUIP Take 0.25 mg by mouth at bedtime.   simvastatin 20 MG tablet Commonly known as: ZOCOR Take 20 mg by mouth at bedtime.   Tylenol 8 Hour 650 MG CR tablet Generic drug: acetaminophen Take 650-1,300 mg by mouth every 8 (eight) hours as needed for pain (or headaches).               Discharge Care Instructions  (From admission, onward)           Start     Ordered   07/28/21 0000  Discharge wound care:       Comments: Keep abscess site clean and dry. Do not soak. Do not attempt to open up on your own.   07/28/21 0843            Follow-up Information     [facility]. Call.   Specialty: Diagnostic Radiology Why: call to make follow up  appointment about 1 week from discharge Contact information: [place] [address] [phone]         Vassie Moment, MD. Schedule an appointment as soon as possible for a visit in 1 week(s).   Specialty: Family Medicine Contact information: [address] [place] [address] [phone]                Allergies  Allergen Reactions   Quetiapine Other (See Comments)    Causes a lot of lethargy the following morning   Vancomycin Itching and Other (See Comments)    All-over, severe itching    Consultations: General surgery    Procedures/Studies: US BREAST LTD UNI LEFT INC AXILLA  Result Date: 07/26/2021 CLINICAL DATA:  History states assess for left breast abscess. Patient states history of previous abscess in this same area. No recent left breast imaging available for comparison. Exam performed via the emergency department as no radiologist present for the exam. EXAM: ULTRASOUND OF THE LEFT BREAST COMPARISON:  Previous exam(s). FINDINGS: Patient was scanned over the area in question from the 7 to 9 o'clock position of the left breast 3-10 cm from the nipple. Targeted ultrasound is performed, showing a small irregular superficial hypoechoic area with slight through transmission over the 8-9 o'clock position 9-10 cm from the nipple. This extends to the skin with the skin is slightly thickened. This hypoechoic area measures 0.6 x 1.2 x 1.6 cm and demonstrates no internal vascularity. This is indeterminate and may represent a complex fluid collection such as abscess in keeping with patient's clinical history. Complicated epidermal inclusion cyst or solid mass is also possible. IMPRESSION: 1.6 cm indeterminate superficial hypoechoic area over the 8-9 o'clock position of the left breast 9-10 cm from the nipple. This may represent a complex fluid collection/abscess versus complicated epidermal inclusion cyst or indeterminate solid mass. RECOMMENDATION: If  concern for infection, recommend 7-10 day course of antibiotics and attempted ultrasound-guided aspiration. If this does not aspirate, biopsy may be  warranted in addition to bilateral diagnostic mammographic evaluation. I have discussed the findings and recommendations with the patient. If applicable, a reminder letter will be sent to the patient regarding the next appointment. BI-RADS CATEGORY  3: Probably benign. Electronically Signed   By: Marin Olp M.D.   On: 07/26/2021 13:56       Discharge Exam: Vitals:   07/27/21 2130 07/28/21 0600  BP: 137/80 116/74  Pulse: 66 (!) 57  Resp: 18 17  Temp: 98 F (36.7 C) 97.8 F (36.6 C)  SpO2: 98% 100%     General: Pt is alert, awake, not in acute distress Cardiovascular: RRR, S1/S2 +, no edema Respiratory: CTA bilaterally, no wheezing, no rhonchi, no respiratory distress, no conversational dyspnea  Abdominal: Soft, NT, ND, bowel sounds + Extremities: no edema, no cyanosis Skin: Resolved erythema of breast and chest wall, significantly smaller abscess with minimal drainage  Psych: Normal mood and affect, stable judgement and insight     The results of significant diagnostics from this hospitalization (including imaging, microbiology, ancillary and laboratory) are listed below for reference.     Microbiology: Recent Results (from the past 240 hour(s))  Blood culture (routine x 2)     Status: None (Preliminary result)   Collection Time: 07/25/21  8:18 PM   Specimen: Right Antecubital; Blood  Result Value Ref Range Status   Specimen Description   Final    RIGHT ANTECUBITAL Performed at [facility], [place] [address], [place], [place] [address]    Special Requests   Final    BOTTLES DRAWN AEROBIC AND ANAEROBIC Blood Culture adequate volume Performed at [facility] [address], [place], [place] [address]    Culture   Final    NO GROWTH 1 DAY Performed at [facility], [place] [address], [place], [place] [address]    Report Status PENDING  Incomplete  Blood culture (routine x 2)     Status: None (Preliminary result)   Collection Time: 07/25/21  8:18 PM   Specimen: Left Antecubital; Blood  Result Value Ref Range Status   Specimen Description   Final    LEFT ANTECUBITAL Performed at [facility] [address] [place], [place] [address]    Special Requests   Final    BOTTLES DRAWN AEROBIC AND ANAEROBIC Blood Culture results may not be optimal due to an excessive volume of blood received in culture bottles Performed at [place] [address], [place], [place] [address]    Culture   Final    NO GROWTH 1 DAY Performed at [facility], [place] [address] [place], [place] [address]    Report Status PENDING  Incomplete  Resp Panel by RT-PCR (Flu A&B, Covid) Nasopharyngeal Swab     Status: None   Collection Time: 07/25/21  8:18 PM   Specimen: Nasopharyngeal Swab; Nasopharyngeal(NP) swabs in vial transport medium  Result Value Ref Range Status   SARS Coronavirus 2 by RT PCR NEGATIVE NEGATIVE Final    Comment: (NOTE) SARS-CoV-2 target nucleic acids are NOT DETECTED.  The SARS-CoV-2 RNA is generally detectable in upper respiratory specimens during the acute phase of infection. The lowest concentration of SARS-CoV-2 viral copies this assay can detect is 138 copies/mL. A negative result does not preclude SARS-Cov-2 infection and should not be used as the sole basis for treatment or other patient management decisions. A negative result may occur with  improper specimen collection/handling, submission of specimen  other than nasopharyngeal swab, presence of viral mutation(s) within the areas targeted by this assay, and inadequate number of viral copies(<138 copies/mL). A negative result must be combined with clinical observations, patient history, and epidemiological information. The expected result is Negative.  Fact Sheet for  Patients:  EntrepreneurPulse.com.au  Fact Sheet for Healthcare Providers:  IncredibleEmployment.be  This test is no t yet approved or cleared by the [place] FDA and  has been authorized for detection and/or diagnosis of SARS-CoV-2 by FDA under an Emergency Use Authorization (EUA). This EUA will remain  in effect (meaning this test can be used) for the duration of the COVID-19 declaration under Section 564(b)(1) of the Act, 21 U.S.C.section 360bbb-3(b)(1), unless the authorization is terminated  or revoked sooner.       Influenza A by PCR NEGATIVE NEGATIVE Final   Influenza B by PCR NEGATIVE NEGATIVE Final    Comment: (NOTE) The Xpert Xpress SARS-CoV-2/FLU/RSV plus assay is intended as an aid in the diagnosis of influenza from Nasopharyngeal swab specimens and should not be used as a sole basis for treatment. Nasal washings and aspirates are unacceptable for Xpert Xpress SARS-CoV-2/FLU/RSV testing.  Fact Sheet for Patients: EntrepreneurPulse.com.au  Fact Sheet for Healthcare Providers: IncredibleEmployment.be  This test is not yet approved or cleared by the [place] FDA and has been authorized for detection and/or diagnosis of SARS-CoV-2 by FDA under an Emergency Use Authorization (EUA). This EUA will remain in effect (meaning this test can be used) for the duration of the COVID-19 declaration under Section 564(b)(1) of the Act, 21 U.S.C. section 360bbb-3(b)(1), unless the authorization is terminated or revoked.  Performed at [facility], [place] [address] [place], [place] [address]   Aerobic Culture w Gram Stain (superficial specimen)     Status: None (Preliminary result)   Collection Time: 07/25/21  8:33 PM   Specimen: Abscess  Result Value Ref Range Status   Specimen Description   Final    ABSCESS Performed at [facility] [address]  [place], [place] [address]    Special Requests   Final    NONE Performed at [facility], [place] [address] [place], [place] [address]    Gram Stain   Final    FEW SQUAMOUS EPITHELIAL CELLS PRESENT FEW WBC PRESENT, PREDOMINANTLY MONONUCLEAR MODERATE GRAM POSITIVE COCCI    Culture   Final    MODERATE STAPHYLOCOCCUS AUREUS CULTURE REINCUBATED FOR BETTER GROWTH Performed at [facility], [place] [address], [place], [place] [address]    Report Status PENDING  Incomplete  MRSA Next Gen by PCR, Nasal     Status: None   Collection Time: 07/26/21  2:31 PM   Specimen: Nasal Mucosa; Nasal Swab  Result Value Ref Range Status   MRSA by PCR Next Gen NOT DETECTED NOT DETECTED Final    Comment: (NOTE) The GeneXpert MRSA Assay (FDA approved for NASAL specimens only), is one component of a comprehensive MRSA colonization surveillance program. It is not intended to diagnose MRSA infection nor to guide or monitor treatment for MRSA infections. Test performance is not FDA approved in patients less than 35 years old. Performed at [facility], [place] [address], [place], [place] [address]      Labs: BNP (last 3 results) No results for input(s): BNP in the last 8760 hours. Basic Metabolic Panel: Recent Labs  Lab 07/25/21 1804 07/26/21 0510 07/27/21 0006  NA 140 139  --   K 3.3* 3.8  --  CL 103 105  --   CO2 26 27  --   GLUCOSE 100* 153*  --   BUN 15 12  --   CREATININE 0.67 0.57 0.75  CALCIUM 9.8 9.5  --    Liver Function Tests: Recent Labs  Lab 07/25/21 1804  AST 17  ALT 13  ALKPHOS 87  BILITOT 0.8  PROT 7.9  ALBUMIN 4.5   No results for input(s): LIPASE, AMYLASE in the last 168 hours. No results for input(s): AMMONIA in the last 168 hours. CBC: Recent Labs  Lab 07/25/21 1804 07/26/21 0510  WBC 13.4* 8.6  NEUTROABS 11.0*  --   HGB 13.8 12.7  HCT 41.2 38.1  MCV 91.2 90.7  PLT 415* 359   Cardiac Enzymes: No results for input(s):  CKTOTAL, CKMB, CKMBINDEX, TROPONINI in the last 168 hours. BNP: Invalid input(s): POCBNP CBG: No results for input(s): GLUCAP in the last 168 hours. D-Dimer No results for input(s): DDIMER in the last 72 hours. Hgb A1c No results for input(s): HGBA1C in the last 72 hours. Lipid Profile No results for input(s): CHOL, HDL, LDLCALC, TRIG, CHOLHDL, LDLDIRECT in the last 72 hours. Thyroid function studies No results for input(s): TSH, T4TOTAL, T3FREE, THYROIDAB in the last 72 hours.  Invalid input(s): FREET3 Anemia work up No results for input(s): VITAMINB12, FOLATE, FERRITIN, TIBC, IRON, RETICCTPCT in the last 72 hours. Urinalysis    Component Value Date/Time   COLORURINE YELLOW 07/25/2021 1825   APPEARANCEUR CLEAR 07/25/2021 1825   LABSPEC 1.014 07/25/2021 1825   PHURINE 5.0 07/25/2021 1825   GLUCOSEU NEGATIVE 07/25/2021 1825   HGBUR NEGATIVE 07/25/2021 1825   BILIRUBINUR NEGATIVE 07/25/2021 1825   KETONESUR 5 (A) 07/25/2021 1825   PROTEINUR NEGATIVE 07/25/2021 1825   UROBILINOGEN 1.0 08/05/2011 1045   NITRITE NEGATIVE 07/25/2021 1825   LEUKOCYTESUR NEGATIVE 07/25/2021 1825   Sepsis Labs Invalid input(s): PROCALCITONIN,  WBC,  LACTICIDVEN Microbiology Recent Results (from the past 240 hour(s))  Blood culture (routine x 2)     Status: None (Preliminary result)   Collection Time: 07/25/21  8:18 PM   Specimen: Right Antecubital; Blood  Result Value Ref Range Status   Specimen Description   Final    RIGHT ANTECUBITAL Performed at [facility], [place] [address], [place], [place] [address]    Special Requests   Final    BOTTLES DRAWN AEROBIC AND ANAEROBIC Blood Culture adequate volume Performed at [place] [address] [place], [place] [address]    Culture   Final    NO GROWTH 1 DAY Performed at [facility], [place] [address], [place], [place] [address]    Report Status PENDING  Incomplete  Blood culture (routine x 2)      Status: None (Preliminary result)   Collection Time: 07/25/21  8:18 PM   Specimen: Left Antecubital; Blood  Result Value Ref Range Status   Specimen Description   Final    LEFT ANTECUBITAL Performed at [place] [address], [place], [place] [address]    Special Requests   Final    BOTTLES DRAWN AEROBIC AND ANAEROBIC Blood Culture results may not be optimal due to an excessive volume of blood received in culture bottles Performed at [place] [address], [place], [place] [address]    Culture   Final    NO GROWTH 1 DAY Performed at [facility], [place] [address], [place], [place] [address]  Report Status PENDING  Incomplete  Resp Panel by RT-PCR (Flu A&B, Covid) Nasopharyngeal Swab     Status: None   Collection Time: 07/25/21  8:18 PM   Specimen: Nasopharyngeal Swab; Nasopharyngeal(NP) swabs in vial transport medium  Result Value Ref Range Status   SARS Coronavirus 2 by RT PCR NEGATIVE NEGATIVE Final    Comment: (NOTE) SARS-CoV-2 target nucleic acids are NOT DETECTED.  The SARS-CoV-2 RNA is generally detectable in upper respiratory specimens during the acute phase of infection. The lowest concentration of SARS-CoV-2 viral copies this assay can detect is 138 copies/mL. A negative result does not preclude SARS-Cov-2 infection and should not be used as the sole basis for treatment or other patient management decisions. A negative result may occur with  improper specimen collection/handling, submission of specimen other than nasopharyngeal swab, presence of viral mutation(s) within the areas targeted by this assay, and inadequate number of viral copies(<138 copies/mL). A negative result must be combined with clinical observations, patient history, and epidemiological information. The expected result is Negative.  Fact Sheet for Patients:  EntrepreneurPulse.com.au  Fact Sheet for Healthcare Providers:   IncredibleEmployment.be  This test is no t yet approved or cleared by the [place] FDA and  has been authorized for detection and/or diagnosis of SARS-CoV-2 by FDA under an Emergency Use Authorization (EUA). This EUA will remain  in effect (meaning this test can be used) for the duration of the COVID-19 declaration under Section 564(b)(1) of the Act, 21 U.S.C.section 360bbb-3(b)(1), unless the authorization is terminated  or revoked sooner.       Influenza A by PCR NEGATIVE NEGATIVE Final   Influenza B by PCR NEGATIVE NEGATIVE Final    Comment: (NOTE) The Xpert Xpress SARS-CoV-2/FLU/RSV plus assay is intended as an aid in the diagnosis of influenza from Nasopharyngeal swab specimens and should not be used as a sole basis for treatment. Nasal washings and aspirates are unacceptable for Xpert Xpress SARS-CoV-2/FLU/RSV testing.  Fact Sheet for Patients: EntrepreneurPulse.com.au  Fact Sheet for Healthcare Providers: IncredibleEmployment.be  This test is not yet approved or cleared by the [place] FDA and has been authorized for detection and/or diagnosis of SARS-CoV-2 by FDA under an Emergency Use Authorization (EUA). This EUA will remain in effect (meaning this test can be used) for the duration of the COVID-19 declaration under Section 564(b)(1) of the Act, 21 U.S.C. section 360bbb-3(b)(1), unless the authorization is terminated or revoked.  Performed at [facility], [place] [address] [place], [place] [address]   Aerobic Culture w Gram Stain (superficial specimen)     Status: None (Preliminary result)   Collection Time: 07/25/21  8:33 PM   Specimen: Abscess  Result Value Ref Range Status   Specimen Description   Final    ABSCESS Performed at [place] [address], [place], [place] [address]    Special Requests   Final    NONE Performed at [facility], [place] [address], [place], [place] [address]    Gram Stain   Final    FEW SQUAMOUS EPITHELIAL CELLS PRESENT FEW WBC PRESENT, PREDOMINANTLY MONONUCLEAR MODERATE GRAM POSITIVE COCCI    Culture   Final    MODERATE STAPHYLOCOCCUS AUREUS CULTURE REINCUBATED FOR BETTER GROWTH Performed at [facility], [place] [address] [place], [place] [address]    Report Status PENDING  Incomplete  MRSA Next Gen by PCR, Nasal     Status: None   Collection Time: 07/26/21  2:31 PM  Specimen: Nasal Mucosa; Nasal Swab  Result Value Ref Range Status   MRSA by PCR Next Gen NOT DETECTED NOT DETECTED Final    Comment: (NOTE) The GeneXpert MRSA Assay (FDA approved for NASAL specimens only), is one component of a comprehensive MRSA colonization surveillance program. It is not intended to diagnose MRSA infection nor to guide or monitor treatment for MRSA infections. Test performance is not FDA approved in patients less than 60 years old. Performed at [facility], [place] [address], [place], [place] [address]      Patient was seen and examined on the day of discharge and was found to be in stable condition. Time coordinating discharge: 25 minutes including assessment and coordination of care, as well as examination of the patient.   SIGNED:  Dessa Phi, DO [facility] Hospitalists 07/28/2021, 12:37 PM

## 2021-07-28 NOTE — Progress Notes (Signed)
MD notified that pt was nauseated after taking her po abx this am. Reviewed written d/c instructions w pt and all questions answered. She verbalized understanding. D/C ambulatory w all belongings in stable condition.

## 2021-07-29 LAB — AEROBIC CULTURE W GRAM STAIN (SUPERFICIAL SPECIMEN)

## 2021-07-30 ENCOUNTER — Other Ambulatory Visit: Payer: Self-pay | Admitting: *Deleted

## 2021-07-30 DIAGNOSIS — N611 Abscess of the breast and nipple: Secondary | ICD-10-CM

## 2021-07-30 DIAGNOSIS — N61 Mastitis without abscess: Secondary | ICD-10-CM

## 2021-07-31 LAB — CULTURE, BLOOD (ROUTINE X 2)
Culture: NO GROWTH
Culture: NO GROWTH
Special Requests: ADEQUATE

## 2021-08-07 ENCOUNTER — Ambulatory Visit: Payer: Medicare Other | Admitting: Orthopaedic Surgery

## 2021-08-08 ENCOUNTER — Other Ambulatory Visit: Payer: Self-pay | Admitting: Nurse Practitioner

## 2021-08-08 DIAGNOSIS — N61 Mastitis without abscess: Secondary | ICD-10-CM

## 2021-08-08 DIAGNOSIS — N611 Abscess of the breast and nipple: Secondary | ICD-10-CM

## 2021-10-02 ENCOUNTER — Ambulatory Visit (HOSPITAL_COMMUNITY)
Admission: EM | Admit: 2021-10-02 | Discharge: 2021-10-02 | Disposition: A | Discharge status: Home / Self Care | Payer: Medicare Other | Attending: Registered Nurse | Admitting: Registered Nurse

## 2021-10-02 DIAGNOSIS — F429 Obsessive-compulsive disorder, unspecified: Secondary | ICD-10-CM

## 2021-10-02 DIAGNOSIS — Z79899 Other long term (current) drug therapy: Secondary | ICD-10-CM | POA: Diagnosis not present

## 2021-10-02 DIAGNOSIS — Z5181 Encounter for therapeutic drug level monitoring: Secondary | ICD-10-CM | POA: Diagnosis not present

## 2021-10-02 DIAGNOSIS — R45851 Suicidal ideations: Secondary | ICD-10-CM | POA: Insufficient documentation

## 2021-10-02 DIAGNOSIS — F3130 Bipolar disorder, current episode depressed, mild or moderate severity, unspecified: Secondary | ICD-10-CM

## 2021-10-02 NOTE — Discharge Instructions (Addendum)
Safety Plan [name] Hinton will reach out to siblings, parents, call 37 or call mobile crisis, or go to nearest emergency room if condition worsens or if suicidal thoughts become active Patients' will follow up with her current psychiatric provider for outpatient psychiatric services (therapy/medication management).  Resources also given if want to change providers  The suicide prevention education provided includes the following: Suicide risk factors Suicide prevention and interventions National Suicide Hotline telephone number [facility] assessment telephone number [facility] Emergency Assistance [place] and/or Residential Mobile Crisis Unit telephone number Request made  Patient Remove weapons (e.g., guns, rifles, knives), all items previously/currently identified as safety concern.   Remove drugs/medications (over the counter, prescriptions, illicit drugs), all items previously/currently identified as a safety concern.      Mobile Crisis Response Teams Listed by counties in vicinity of [facility] providers [place]. [phone] [place] [phone] [place] [phone] [facility] [phone] [place] [phone]                * [place] [phone]  [place]. [phone] [place].  [place] [employer] [phone]      Please Follow up with Outpatient Services  [facility] (Therapy only) (takes Medicaid and most major insurances) [place]:  [address] [place], [place] [address] Phone: [phone] [place]:  [address], [place], [place] [address]   Phone: [phone] [place]:  [address]  [place], [place] [address]  Phone: [place]:   (will take occasional Medicaid. Takes most major insurances in network) [place]: [place] [address], [place]: [address], [place] WERXV:[phone] [place]: [place] Phone: [phone] [place]: [place], [place] Phone: [place]  (takes sliding scale and Medicaid as well as other major insurances)  [place]:   [address] [place] [address]  Phone: [phone] [place]:   [facility] [address], [place], [place] [address]   Phone: [phone]  [facility] [place]  (medication, therapy, intensive in home services)  [address], [place] [address] [place], [place] [address] [phone]  [facility] (Therapy only)  Specializes in [place], perinatal mood disorders, anxiety and depression [address] [place], [place] [address] [place] (MST Intensive in-home services) [place], [place] [address] [place]  (intensive in home services) [address] [place], [place] [address] [place]  (medication and therapy) [address], [place], [place] [address] [place], [place] [address] [phone]  [facility] [address] [place], [place] [address] Website:  info@hopeway .org Phone: [phone] Fax: [phone]  [facility] (medication) [place], [place], [place] [address] Phone: [phone]  [facility] (intensive in-home, medication management) [place], [place] [address] [phone]  [facility]  (specializes in trauma therapy) [address] [place]  [place], [place] [address] [place]  [place] (Psychological testing) [place] [address],  [place], [place] [address] [phone]  [facility]  [address] [place], [place] [address] Phone:  [phone] Website:  [email or web address]

## 2021-10-02 NOTE — ED Provider Notes (Signed)
Behavioral Health Urgent Care Medical Screening Exam  Patient Name: [name] Hinton MRN: [id number] Date of Evaluation: 10/02/21 Chief Complaint:   Diagnosis:  Final diagnoses:  None    History of Present illness: [name] Hinton is a 60 y.o. female patient presented to [facility] as a walk in with complaints of worsening depression and current medications not working [name] Hinton, 60 y.o., female patient seen face to face by this provider, consulted with Dr. Ernie Hew; and chart reviewed on 10/02/21.  On evaluation [name] Hinton reports she see Dr. Darleene Cleaver as outpatient psychiatric provider.  "I don't know his name, all I know is he call me on the phone every three months."  Patient states she has worsening depress and doesn't feel like her medications are working.  States she is wanting medication adjustment or changes.  Patient denies homicidal ideation, psychosis, and paranoia.  States that she does have some passive suicidal thoughts "but I don't want to die."  Patient stats she is also thinking about changing provider and wants a list of resources.  During evaluation [name] Hinton is sitting up right in chair in no acute distress.  She is alert/oriented x 4; calm/cooperative; and mood congruent with affect.  She is speaking in a clear tone at moderate volume, and normal pace; with good eye contact.  Her thought process is coherent and relevant; There is no indication that she is currently responding to internal/external stimuli or experiencing delusional thought content; and she has denied suicidal/self-harm/homicidal ideation, psychosis, and paranoia.   Patient has remained calm throughout assessment and has answered questions appropriately.    At this time [name] Hinton is educated and verbalizes understanding of mental health resources and other crisis services in the community. She is instructed to call 911 and present to the nearest emergency room should she experience any  suicidal/homicidal ideation, auditory/visual/hallucinations, or detrimental worsening of her mental health condition.  She was a also advised by Probation officer that she could call the toll-free phone on insurance card to assist with identifying in network counselors and agencies.    Psychiatric Specialty Exam  Presentation  General Appearance:Appropriate for Environment; Casual  Eye Contact:Good  Speech:Clear and Coherent; Normal Rate  Speech Volume:Normal  Handedness:Right   Mood and Affect  Mood:Dysphoric  Affect:Congruent; Appropriate   Thought Process  Thought Processes:Coherent; Goal Directed  Descriptions of Associations:Intact  Orientation:Full (Time, Place and Person)  Thought Content:WDL  Diagnosis of Schizophrenia or Schizoaffective disorder in past: No   Hallucinations:None  Ideas of Reference:None  Suicidal Thoughts:Yes, Passive ("I've had thoughts of dying but I don't want to die."  States no intent or plan) Without Intent; Without Plan  Homicidal Thoughts:No   Sensorium  Memory:Immediate Good; Recent Good; Remote Good  Judgment:Intact  Insight:Present   Executive Functions  Concentration:Good  Attention Span:Good  [place]  Language:Good   Psychomotor Activity  Psychomotor Activity:Normal   Assets  Assets:Communication Skills; Desire for Improvement; Financial Resources/Insurance; Housing; Leisure Time; Resilience; Social Support   Sleep  Sleep:Good  Number of hours: No data recorded  No data recorded  Physical Exam: Physical Exam Vitals and nursing note reviewed. Exam conducted with a chaperone present.  Constitutional:      General: She is not in acute distress.    Appearance: Normal appearance. She is not ill-appearing.  Cardiovascular:     Rate and Rhythm: Normal rate.  Pulmonary:     Effort: Pulmonary effort is normal.  Musculoskeletal:  General: Normal range of motion.     Cervical  back: Normal range of motion.  Skin:    General: Skin is warm and dry.  Neurological:     Mental Status: She is alert and oriented to person, place, and time.  Psychiatric:        Attention and Perception: Attention and perception normal. She does not perceive auditory or visual hallucinations.        Mood and Affect: Affect normal. Mood is depressed.        Speech: Speech normal.        Behavior: Behavior normal. Behavior is cooperative.        Thought Content: Thought content is not paranoid or delusional. Thought content does not include homicidal ideation. Suicidal: Passive thoughts.Thought content does not include suicidal plan.        Cognition and Memory: Cognition and memory normal.        Judgment: Judgment normal.   Review of Systems  Constitutional: Negative.   HENT: Negative.    Eyes: Negative.   Respiratory: Negative.    Cardiovascular: Negative.   Gastrointestinal: Negative.   Genitourinary: Negative.   Musculoskeletal: Negative.   Skin: Negative.   Neurological: Negative.   Endo/Heme/Allergies: Negative.   Psychiatric/Behavioral:  Positive for depression. Negative for hallucinations and substance abuse. Suicidal ideas: Passive thought with no plan or intent.Nervous/anxious: Stable.   Blood pressure (!) 191/95, pulse 73, temperature 98.7 F (37.1 C), temperature source Oral, resp. rate 16, SpO2 100 %. There is no height or weight on file to calculate BMI.  Musculoskeletal: Strength & Muscle Tone: within normal limits Gait & Station: normal Patient leans: N/A   [place] MSE Discharge Disposition for Follow up and Recommendations: Based on my evaluation the patient does not appear to have an emergency medical condition and can be discharged with resources and follow up care in outpatient services for Medication Management, Partial Hospitalization Program, Individual Therapy, and Group Therapy   Deondra Labrador, NP 10/02/2021, 4:38 PM

## 2021-10-02 NOTE — ED Notes (Signed)
Pt discharged in no acute distress. Resources provided by NP and printed on pt's AVS. Pt verbalized understanding of instructions for follow up care. Safety maintained. Pt escorted to lobby via staff for self transport to home.
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
## 2021-10-03 ENCOUNTER — Telehealth (HOSPITAL_COMMUNITY): Payer: Self-pay | Admitting: Psychiatry

## 2021-10-03 ENCOUNTER — Telehealth (HOSPITAL_COMMUNITY): Payer: Self-pay

## 2021-10-03 NOTE — BH Assessment (Signed)
Care Management - Follow Up [facility] Discharges   Writer attempted to make contact with patient today and was unsuccessful.  Writer was not able to leave a HIPPA compliant voice message because the patient voice mail box is full.    Per chart review, pt referred to [place].

## 2021-10-03 NOTE — Telephone Encounter (Signed)
D:  Shuvon Rankin, NP referred pt to [facility]-IOP/PHP.  A:  Placed call to orient pt, but there was no answer and vm was full.  Will attempt to call pt later.  Inform Shuvon.

## 2021-10-11 ENCOUNTER — Ambulatory Visit (HOSPITAL_COMMUNITY)
Admission: RE | Admit: 2021-10-11 | Discharge: 2021-10-11 | Disposition: A | Discharge status: Home / Self Care | Payer: Medicare Other | Attending: Psychiatry | Admitting: Psychiatry

## 2021-10-11 NOTE — BH Assessment (Signed)
Clinician informed by [facility] Carlis Abbott, RN) that patient presented as a walk-in. MSE completed by [facility] AC.  AC (Carlis Abbott, RN), informed this Clinician that patient signed her MSE.  he [facility] provider attempted to complete the MSE. However, patient reportedly left the building before the [facility] provider could complete her MSE  She reportedly did not want to stay for the remainder of the process. No TTS assessment completed on this visit.

## 2021-10-15 ENCOUNTER — Ambulatory Visit (HOSPITAL_COMMUNITY)
Admission: RE | Admit: 2021-10-15 | Discharge: 2021-10-15 | Disposition: A | Discharge status: Home / Self Care | Payer: Medicare Other | Attending: Psychiatry | Admitting: Psychiatry

## 2021-10-15 NOTE — H&P (Signed)
Behavioral Health Medical Screening Exam  [name] Hinton is a 60 y.o. female seen by this provider with TTS counselor as voluntary walk-in at [facility] accompanied by no one. Patient is sitting in room, eating chips and drinking water. She is alert and oriented x 3, cooperative and has good eye contact. She reports "I don't think my medications are working, I've been on them for so long"  History of depression, anxiety, bipolar. Reports compliance with current medications. Reports her psychiatrist left the practice, she had appointment with another and describes as "the most disappointing appointment I've very had" This provider increased her Wellbutrin, patient will not start new dose "I don't trust him". Does not see a therapist.   Denies suicidal ideation, states, "I don't want to kill myself, I just don't want to live" I need to take care of my parents". Denies homicidal ideation, denies auditory and visual hallucinations. Does not appear to be responding to internal or external stimuli. Denies self injurious behaviors, one past suicide attempt > 10 years ago, "took pills". Denies self-injurious behaviors. Has anger issues but not against a person.   Describes herself as irritable, depressed, worthless, and she is tearful during interview. Symptoms has worsened in past 3 months.   Lives with mother and father whom she cares for, father has dementia. This is a source of stress.   Denies substance use, rarely has one glass or one bottle of wine depending on the situation. Sleep is poor, 0-4 hours per night. 4 hours is a good night.   Chart review: 11/2 seen at [facility], psych cleared with resources for PHP (patient did not follow through with this referral). 11/11- [facility], left before being seen.  Total Time spent with patient: 30 minutes  Psychiatric Specialty Exam:  Presentation  General Appearance: Appropriate for Environment  Eye Contact:Good  Speech:Clear and Coherent; Normal Rate  Speech  Volume:Normal  Handedness:Right   Mood and Affect  Mood:Depressed  Affect:Congruent   Thought Process  Thought Processes:Coherent; Goal Directed  Descriptions of Associations:Intact  Orientation:Full (Time, Place and Person)  Thought Content:Logical  History of Schizophrenia/Schizoaffective disorder:No  Duration of Psychotic Symptoms:No data recorded Hallucinations:Hallucinations: None Ideas of Reference:None  Suicidal Thoughts:Suicidal Thoughts: No Homicidal Thoughts:Homicidal Thoughts: No  Sensorium  Memory:Immediate Good; Recent Good; Remote Good  Judgment:Good  Insight:Good   Executive Functions  Concentration:Good  Attention Span:Good  [place] of Knowledge:Good  Language:Good   Psychomotor Activity  Psychomotor Activity:Psychomotor Activity: Normal  Assets  Assets:Communication Skills; Desire for Improvement; Financial Resources/Insurance; Housing; Physical Health; Resilience; Social Support   Sleep  Sleep:Sleep: Poor Number of Hours of Sleep: 4   Physical Exam: Physical Exam Vitals reviewed.  Constitutional:      Appearance: Normal appearance.  Cardiovascular:     Rate and Rhythm: Normal rate.  Pulmonary:     Effort: Pulmonary effort is normal.     Breath sounds: Normal breath sounds.  Musculoskeletal:        General: Normal range of motion.  Skin:    General: Skin is warm and dry.  Neurological:     Mental Status: She is alert and oriented to person, place, and time.  Psychiatric:        Attention and Perception: Attention normal.        Mood and Affect: Mood is anxious. Affect is tearful.        Speech: Speech normal.        Behavior: Behavior normal. Behavior is cooperative.  Thought Content: Thought content is not paranoid or delusional. Thought content does not include homicidal or suicidal ideation. Thought content does not include homicidal or suicidal plan.   Review of Systems  Constitutional:  Negative  for chills and fever.  Respiratory:  Negative for shortness of breath.   Cardiovascular:  Negative for chest pain.  Gastrointestinal:  Negative for abdominal pain, nausea and vomiting.  Neurological:  Negative for headaches.  Psychiatric/Behavioral:  Positive for depression. Negative for hallucinations, substance abuse and suicidal ideas. The patient is nervous/anxious and has insomnia.   Blood pressure (!) 171/103, pulse 73, temperature 98.2 F (36.8 C), temperature source Oral, resp. rate 16, SpO2 100 %. There is no height or weight on file to calculate BMI.  Musculoskeletal: Strength & Muscle Tone: within normal limits Gait & Station: normal Patient leans: N/A   Recommendations:  Based on my evaluation the patient does not appear to have an emergency medical condition. Does not meet criteria for inpatient psychiatric admission. This patient has been seen on 11/2 where she was psych cleared and recommended PHP. Information on PHP provided again as this patient would benefit from this comprehensive service for medication management, therapy, and psychiatrist. Agrees with plan. No safety concerns.   Chalmers Guest, NP 10/15/2021, 2:00 PM
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
## 2021-12-19 ENCOUNTER — Other Ambulatory Visit: Payer: Self-pay | Admitting: Orthopaedic Surgery

## 2022-02-17 ENCOUNTER — Other Ambulatory Visit: Payer: Self-pay | Admitting: Orthopaedic Surgery

## 2022-02-17 ENCOUNTER — Other Ambulatory Visit: Payer: Self-pay | Admitting: Physician Assistant

## 2022-02-17 ENCOUNTER — Telehealth: Payer: Self-pay

## 2022-02-17 MED ORDER — TIZANIDINE HCL 4 MG PO TABS: 4.0000 mg | ORAL_TABLET | Freq: Three times a day (TID) | ORAL | 0 refills | Status: DC | PRN

## 2022-02-17 MED ORDER — GABAPENTIN 300 MG PO CAPS: 300.0000 mg | ORAL_CAPSULE | Freq: Three times a day (TID) | ORAL | 0 refills | Status: DC

## 2022-02-17 NOTE — Telephone Encounter (Signed)
Request refill of Tizanidine '4mg'$  tablet and Gabapentin '300mg'$  ?Last seen 06/26/21 ?If refilled- sent to [facility] mail in pharmacy  ?

## 2022-02-18 ENCOUNTER — Other Ambulatory Visit: Payer: Self-pay | Admitting: Orthopaedic Surgery

## 2022-03-24 ENCOUNTER — Ambulatory Visit (INDEPENDENT_AMBULATORY_CARE_PROVIDER_SITE_OTHER): Payer: Medicare Other

## 2022-03-24 ENCOUNTER — Encounter: Payer: Self-pay | Admitting: Physician Assistant

## 2022-03-24 ENCOUNTER — Ambulatory Visit (INDEPENDENT_AMBULATORY_CARE_PROVIDER_SITE_OTHER): Payer: Medicare Other | Admitting: Physician Assistant

## 2022-03-24 DIAGNOSIS — M25551 Pain in right hip: Secondary | ICD-10-CM | POA: Diagnosis not present

## 2022-03-24 DIAGNOSIS — M7061 Trochanteric bursitis, right hip: Secondary | ICD-10-CM | POA: Diagnosis not present

## 2022-03-24 DIAGNOSIS — M5459 Other low back pain: Secondary | ICD-10-CM

## 2022-03-24 MED ORDER — LIDOCAINE HCL 1 % IJ SOLN
3.0000 mL | INTRAMUSCULAR | Status: AC | PRN
  Administered 2022-03-24: 3 mL

## 2022-03-24 MED ORDER — METHYLPREDNISOLONE ACETATE 40 MG/ML IJ SUSP
40.0000 mg | INTRAMUSCULAR | Status: AC | PRN
  Administered 2022-03-24: 40 mg via INTRA_ARTICULAR

## 2022-03-24 NOTE — Progress Notes (Signed)
? ?Office Visit Note ?  ?Patient: [name] Hinton           ?Date of Birth: 1961-09-07           ?MRN: [id number] ?Visit Date: 03/24/2022 ?             ?Requested by: No referring provider defined for this encounter. ?PCP: Pcp, No ? ? ?Assessment & Plan: ?Visit Diagnoses:  ?1. Trochanteric bursitis of right hip   ? ? ?Plan: Patient on IT band stretching exercises.  We will see her back in 2 to 3 weeks if her pain persist.  Otherwise as needed.  Questions were encouraged and answered at length. ? ?Follow-Up Instructions: No follow-ups on file.  ? ?Orders:  ?Orders Placed This Encounter  ?Procedures  ? Large Joint Inj  ? XR HIP UNILAT W OR W/O PELVIS 2-3 VIEWS RIGHT  ? ?No orders of the defined types were placed in this encounter. ? ? ? ? Procedures: ?Large Joint Inj: R greater trochanter on 03/24/2022 3:21 PM ?Indications: pain ?Details: 22 G 1.5 in needle, lateral approach ? ?Arthrogram: No ? ?Medications: 3 mL lidocaine 1 %; 40 mg methylPREDNISolone acetate 40 MG/ML ?Outcome: tolerated well, no immediate complications ?Procedure, treatment alternatives, risks and benefits explained, specific risks discussed. Consent was given by the patient. Immediately prior to procedure a time out was called to verify the correct patient, procedure, equipment, support staff and site/side marked as required. Patient was prepped and draped in the usual sterile fashion.  ? ? ? ? ?Clinical Data: ?No additional findings. ? ? ?Subjective: ?Chief Complaint  ?Patient presents with  ? Right Hip - Pain  ? Lower Back - Pain  ? ? ?HPI ?[name] Hinton returns today with right leg pain.  She does have known chronic low back pain with left sciatic like symptoms in the past.  However now she is having right groin pain that is constant.  No known injury.  Pain does go down to the knee at times.  Otherwise no real radicular symptoms down either leg. ? ?Review of Systems ?See HPI ? ?Objective: ?Vital Signs: There were no vitals taken for this  visit. ? ?Physical Exam ?General: Well-developed well-nourished female no acute distress mood and affect appropriate. ?Psych: Alert and oriented x3 ?Ortho Exam ?Bilateral hips good range of motion both hips.  Internal rotation the right hip causes discomfort.  She has tenderness at the trochanteric region both his right is worse than the left.  Ambulates without assistive device ?Specialty Comments:  ?No specialty comments available. ? ?Imaging: ?XR HIP UNILAT W OR W/O PELVIS 2-3 VIEWS RIGHT ? ?Result Date: 03/24/2022 ?AP pelvis lateral view right hip: Bilateral hips well located.  No acute fracture.  Hips are well-maintained bilaterally.  ? ? ?PMFS History: ?Patient Active Problem List  ? Diagnosis Date Noted  ? Medication management 10/02/2021  ? Bipolar I disorder, most recent episode depressed ([place])   ? Cellulitis of left breast 07/25/2021  ? Depression with anxiety 07/25/2021  ? Abscess of left breast 07/25/2021  ? 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 Low back pain 09/24/2011  ? Restless legs syndrome (RLS) 03/24/2011  ? Depressive disorder, not elsewhere classified 09/17/2010  ? Bipolar I disorder, single manic episode ([place]) 08/20/2010  ? Benign essential hypertension 05/14/2010  ? Hyperlipemia 05/14/2010  ? Osteopenia 12/03/2009  ? Obsessive-compulsive disorder 10/17/2009  ? ?Past Medical History:  ?Diagnosis Date  ? Anxiety   ? Bipolar affective ([place])   ?  Chronic headaches   ? Depression   ? Hypercholesteremia   ? Hypertension   ? Restless leg syndrome   ?  ?Family History  ?Problem Relation Age of Onset  ? Breast cancer Neg Hx   ?  ?Past Surgical History:  ?Procedure Laterality Date  ? ABDOMINAL HYSTERECTOMY    ? OOPHORECTOMY    ? TONSILLECTOMY    ? ?Social History  ? ?Occupational History  ? Not on file  ?Tobacco Use  ? Smoking status: Some Days  ? Smokeless tobacco: Never  ?Substance and Sexual Activity  ? Alcohol use: No  ? Drug use: No  ? Sexual activity: Yes  ?  Birth control/protection: Surgical   ? ? ? ? ? ? ?

## 2022-04-17 ENCOUNTER — Ambulatory Visit: Payer: Medicare Other | Admitting: Physician Assistant

## 2022-04-30 ENCOUNTER — Ambulatory Visit: Payer: Medicare Other | Admitting: Physician Assistant

## 2022-05-09 ENCOUNTER — Other Ambulatory Visit: Payer: Self-pay | Admitting: Physician Assistant

## 2022-05-09 DIAGNOSIS — Z720 Tobacco use: Secondary | ICD-10-CM

## 2022-05-15 ENCOUNTER — Ambulatory Visit: Payer: Medicare Other | Admitting: Physician Assistant

## 2022-05-26 ENCOUNTER — Ambulatory Visit: Payer: Medicare Other | Admitting: Physician Assistant

## 2022-05-29 ENCOUNTER — Encounter: Payer: Self-pay | Admitting: Physician Assistant

## 2022-05-29 ENCOUNTER — Ambulatory Visit (INDEPENDENT_AMBULATORY_CARE_PROVIDER_SITE_OTHER): Payer: Medicare Other

## 2022-05-29 ENCOUNTER — Ambulatory Visit (INDEPENDENT_AMBULATORY_CARE_PROVIDER_SITE_OTHER): Payer: Medicare Other | Admitting: Physician Assistant

## 2022-05-29 DIAGNOSIS — M25511 Pain in right shoulder: Secondary | ICD-10-CM

## 2022-05-29 DIAGNOSIS — G8929 Other chronic pain: Secondary | ICD-10-CM | POA: Diagnosis not present

## 2022-05-29 DIAGNOSIS — M5442 Lumbago with sciatica, left side: Secondary | ICD-10-CM

## 2022-05-29 MED ORDER — METHYLPREDNISOLONE ACETATE 40 MG/ML IJ SUSP
40.0000 mg | INTRAMUSCULAR | Status: AC | PRN
  Administered 2022-05-29: 40 mg via INTRA_ARTICULAR

## 2022-05-29 MED ORDER — LIDOCAINE HCL 1 % IJ SOLN
3.0000 mL | INTRAMUSCULAR | Status: AC | PRN
  Administered 2022-05-29: 3 mL

## 2022-05-29 NOTE — Progress Notes (Signed)
Office Visit Note   Patient: [name] Hinton           Date of Birth: [birth year]           MRN: [id number] Visit Date: 05/29/2022              Requested by: No referring provider defined for this encounter. PCP: Pcp, No   Assessment & Plan: Visit Diagnoses:  1. Acute pain of right shoulder     Plan: She shown forward flexion exercises, Codman, wall crawls and pendulum exercises which she will perform on her own at home.  We will see her back in 2 weeks to reassess her shoulder.  Questions encouraged and answered at length.  She did ask about seeing Dr. Ernestina Patches for her low back pain.  She is having low back pain without radicular symptoms.  Pain is worse with standing.  Therefore we will refer her back to see Dr. Ernestina Patches for possible facet injections.  Follow-Up Instructions: Return in about 2 weeks (around 06/12/2022).   Orders:  Orders Placed This Encounter  Procedures   Large Joint Inj   XR Shoulder Right   No orders of the defined types were placed in this encounter.     Procedures: Large Joint Inj: R subacromial bursa on 05/29/2022 5:54 PM Indications: pain Details: 22 G 1.5 in needle, superior approach  Arthrogram: No  Medications: 3 mL lidocaine 1 %; 40 mg methylPREDNISolone acetate 40 MG/ML Outcome: tolerated well, no immediate complications Procedure, treatment alternatives, risks and benefits explained, specific risks discussed. Consent was given by the patient. Immediately prior to procedure a time out was called to verify the correct patient, procedure, equipment, support staff and site/side marked as required. Patient was prepped and draped in the usual sterile fashion.       Clinical Data: No additional findings.   Subjective: Chief Complaint  Patient presents with   Right Shoulder - Pain   Lower Back - Pain    HPI Mrs. [name] Hinton has 61 year old female comes in today with right shoulder pain for 3 weeks.  We last saw her for hip pain she was given a  trochanteric injection and states this is helped with her hip pain.  She is having some low back pain and seen Dr. Ernestina Patches in the past for low back pain she has had no injury to the back.  She states her pain is in the mid of her lower back without radicular symptoms and is worse with standing. Right shoulder pain again ongoing for the past 3 weeks.  Had a painful pop in the shoulder.  Otherwise no injury.  She denies any numbness tingling down the arm.  She has had no prior history of shoulder pain.  She been taking ibuprofen for the pain. Review of Systems See HPI  Objective: Vital Signs: There were no vitals taken for this visit.  Physical Exam General: Well-developed well-nourished female no acute distress ambulates with a out antalgic gait or assistive device. Psych: Alert and oriented x3 Ortho Exam Bilateral shoulders 5 5 strength with external and internal rotation against resistance.  Empty can test is negative bilaterally.  Impingement testing negative bilaterally.  She has positive crossover test on the right negative on the left.  She has full range of motion both shoulders.  No tenderness over the [facility] joint on the right.  Specialty Comments:  No specialty comments available.  Imaging: XR Shoulder Right  Result Date: 05/29/2022 Right shoulder 3 views: No acute  fractures.  Shoulder is well located.  Glenohumeral joints well-maintained.  No significant arthritic changes.    PMFS History: Patient Active Problem List   Diagnosis Date Noted   Medication management 10/02/2021   Bipolar I disorder, most recent episode depressed ([place])    Cellulitis of left breast 07/25/2021   Depression with anxiety 07/25/2021   Abscess of left breast 07/25/2021   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   Low back pain 09/24/2011   Restless legs syndrome (RLS) 03/24/2011   Depressive disorder, not elsewhere classified 09/17/2010   Bipolar I disorder, single manic episode ([place]) 08/20/2010   Benign  essential hypertension 05/14/2010   Hyperlipemia 05/14/2010   Osteopenia 12/03/2009   Obsessive-compulsive disorder 10/17/2009   Past Medical History:  Diagnosis Date   Anxiety    Bipolar affective (HCC)    Chronic headaches    Depression    Hypercholesteremia    Hypertension    Restless leg syndrome     Family History  Problem Relation Age of Onset   Breast cancer Neg Hx     Past Surgical History:  Procedure Laterality Date   ABDOMINAL HYSTERECTOMY     OOPHORECTOMY     TONSILLECTOMY     Social History   Occupational History   Not on file  Tobacco Use   Smoking status: Some Days   Smokeless tobacco: Never  Substance and Sexual Activity   Alcohol use: No   Drug use: No   Sexual activity: Yes    Birth control/protection: Surgical

## 2022-05-30 NOTE — Addendum Note (Signed)
Addended by: Robyne Peers on: 05/30/2022 02:22 PM   Modules accepted: Orders

## 2022-06-10 ENCOUNTER — Telehealth: Payer: Self-pay | Admitting: Physical Medicine and Rehabilitation

## 2022-06-10 NOTE — Telephone Encounter (Signed)
Patient returned call asked for a call back     Patient phone number is [phone]

## 2022-06-12 ENCOUNTER — Encounter: Payer: Self-pay | Admitting: Physician Assistant

## 2022-06-12 ENCOUNTER — Ambulatory Visit (INDEPENDENT_AMBULATORY_CARE_PROVIDER_SITE_OTHER): Payer: Medicare Other | Admitting: Physician Assistant

## 2022-06-12 DIAGNOSIS — M25511 Pain in right shoulder: Secondary | ICD-10-CM | POA: Diagnosis not present

## 2022-06-12 NOTE — Progress Notes (Signed)
HPI: Mrs. Hise returns today status post right shoulder subacromial injection 2 weeks ago.  She states she is 50% better.  She states she has pain that is 6 out of 10 pain at its worst first thing in the morning.  She still has some discomfort with external and internal rotation but feels like the shoulder is overall improving.  She has had no popping of the shoulder since the injection.  He is having no numbness tingling down the arm.  Review of systems: See HPI otherwise negative or noncontributory.  Physical exam: General well-developed well-nourished female no acute distress mood affect appropriate.  Psych alert and oriented x3.  Bilateral shoulders: 5 out of 5 strength external and internal rotation against resistance.  Empty can test is negative bilaterally.  Liftoff test is negative bilaterally.  Extremes of internal/external rotation of the right shoulder causes discomfort.  Slight impingement right shoulder.   Impression: Acute right shoulder pain  Plan: At this point time we will send her to formal physical therapy for range of motion strengthening and home exercise program for the right shoulder.  They will also include modalities.  We will have her follow-up with [place] in 4 weeks see how she is doing overall.  Pain persist may need to image with an MRI to rule out internal derangement.  Questions were encouraged and answered.

## 2022-06-13 NOTE — Addendum Note (Signed)
Addended by: Robyne Peers on: 06/13/2022 02:58 PM   Modules accepted: Orders

## 2022-06-16 ENCOUNTER — Ambulatory Visit
Admission: RE | Admit: 2022-06-16 | Discharge: 2022-06-16 | Disposition: A | Discharge status: Home / Self Care | Payer: Medicare Other | Source: Ambulatory Visit | Attending: Physician Assistant | Admitting: Physician Assistant

## 2022-06-16 ENCOUNTER — Other Ambulatory Visit: Payer: Self-pay | Admitting: Physician Assistant

## 2022-06-16 ENCOUNTER — Encounter: Payer: Self-pay | Admitting: Physical Medicine and Rehabilitation

## 2022-06-16 ENCOUNTER — Ambulatory Visit (INDEPENDENT_AMBULATORY_CARE_PROVIDER_SITE_OTHER): Payer: Medicare Other | Admitting: Physical Medicine and Rehabilitation

## 2022-06-16 DIAGNOSIS — Z720 Tobacco use: Secondary | ICD-10-CM

## 2022-06-16 DIAGNOSIS — M47816 Spondylosis without myelopathy or radiculopathy, lumbar region: Secondary | ICD-10-CM | POA: Diagnosis not present

## 2022-06-16 DIAGNOSIS — G8929 Other chronic pain: Secondary | ICD-10-CM | POA: Diagnosis not present

## 2022-06-16 DIAGNOSIS — M545 Low back pain, unspecified: Secondary | ICD-10-CM | POA: Diagnosis not present

## 2022-06-16 NOTE — Progress Notes (Unsigned)
Pt state lower back pain. Pt state walking, standing and laying down makes the pain worse. Pt state she uses heat and ice and take over the counter pain meds tohelp ease her pain.  Numeric Pain Rating Scale and Functional Assessment Average Pain 10 Pain Right Now 4 My pain is constant, burning, dull, stabbing, and aching Pain is worse with: walking, bending, sitting, standing, some activites, and laying down Pain improves with: rest, heat/ice, and medication   In the last MONTH (on 0-10 scale) has pain interfered with the following?  1. General activity like being  able to carry out your everyday physical activities such as walking, climbing stairs, carrying groceries, or moving a chair?  Rating(5)  2. Relation with others like being able to carry out your usual social activities and roles such as  activities at home, at work and in your community. Rating(6)  3. Enjoyment of life such that you have  been bothered by emotional problems such as feeling anxious, depressed or irritable?  Rating(7)

## 2022-06-16 NOTE — Progress Notes (Unsigned)
Jermiya Reichl Amendola - 61 y.o. female MRN [id number]  Date of birth: 04-21-1961  Office Visit Note: Visit Date: 06/16/2022 PCP: Pcp, No Referred by: Pete Pelt, PA-C  Subjective: Chief Complaint  Patient presents with   Lower Back - Pain   HPI: REISA COPPOLA is a 61 y.o. female who comes in today per the request of Erskine Emery, PA for evaluation of chronic, worsening and severe bilateral lower back pain. Patient reports pain has been ongoing for several years. States her pain is exacerbated by movement, activity and when performing daily tasks. She describes her pain as a constant sore and aching sensation, currently rates as 7 out of 10. She reports some relief of pain with home exercise regimen, heating pad, rest and over the counter medications. Patient has attended formal physical therapy for chronic lower back issues in the past at [facility], states short term relief with these treatments. Patients lumbar MRI imaging from 2020 exhibits levoscoliosis and multilevel facet arthropathy causing stenosis. No high grade spinal canal stenosis noted. Patient was last seen in our office in 2021 for lumbar epidural steroid injection, she reports this procedure helped significantly at that time. Patient states her severe pain is negatively impacting daily life, states she has to stop and take breaks frequently while performing tasks. Patient denies focal weakness, numbness and tingling. Patient denies recent trauma or falls.     {Oswestry Disability Score:26558}  Review of Systems  Musculoskeletal:  Positive for back pain.  Neurological:  Negative for tingling, sensory change, focal weakness and weakness.  All other systems reviewed and are negative.  Otherwise per HPI.  Assessment & Plan: Visit Diagnoses:    ICD-10-CM   1. Chronic bilateral low back pain without sciatica  M54.50 Ambulatory referral to Physical Medicine Rehab   G89.29     2. Spondylosis without myelopathy or  radiculopathy, lumbar region  M47.816 Ambulatory referral to Physical Medicine Rehab    3. Facet arthropathy, lumbar  M47.816 Ambulatory referral to Physical Medicine Rehab       Plan: Findings:  Chronic, worsening and severe bilateral axial back pain. Patient continues to have severe pain despite good conservative therapies such as formal physical therapy, home exercise regimen, rest and use of over the counter medications. Patients clinical presentation and exam are consistent with facet mediated pain. She does have pain with lumbar extension upon exam today. We believe the next step is to perform diagnostic and hopefully therapeutic bilateral L4-L5 and L5-S1 facet joint/medial branch blocks under fluoroscopic guidance. If patient gets good relief of pain with diagnostic facet joint blocks I did discuss the possibility of longer sustained pain relief with radiofrequency ablation procedure. I also provided patient with educational material regarding radiofrequency ablation procedure to take home and review. Patient has no questions at this time. No red flag symptoms noted upon exam today.     Meds & Orders: No orders of the defined types were placed in this encounter.   Orders Placed This Encounter  Procedures   Ambulatory referral to Physical Medicine Rehab    Follow-up: Return for Bilateral L4-L5 and L5-S1 facet joint/medial branch blocks.   Procedures: No procedures performed      Clinical History: EXAM: MRI LUMBAR SPINE WITHOUT CONTRAST   TECHNIQUE: Multiplanar, multisequence MR imaging of the lumbar spine was performed. No intravenous contrast was administered.   COMPARISON:  11/02/2019   FINDINGS: Segmentation:  Normal   Alignment: Mild retrolisthesis L1-2, L2-3, L3-4, L4-5. Mild anterolisthesis  L5-S1. levoscoliosis at L1-2.   Vertebrae:  Negative for fracture or mass.   Conus medullaris and cauda equina: Conus extends to the L1 level. Conus and cauda equina appear  normal.   Paraspinal and other soft tissues: Negative for paraspinous mass or adenopathy.   Disc levels:   T11-12: Left-sided disc protrusion. Mild flattening of the left side of the cord. Mild left foraminal narrowing   T12-L1: Extensive disc degeneration. Small central disc protrusion. Extruded disc fragment in the midline behind the L1 vertebral body. No significant spinal stenosis. Moderate left foraminal stenosis.   L1-2: Central disc protrusion with upgoing extruded disc fragment in the midline. Disc degeneration with endplate spurring right greater than left. Moderate subarticular stenosis on the right. Mild spinal stenosis.   L2-3: Disc degeneration with disc space narrowing and spurring left greater than right. Moderate to severe subarticular stenosis on the left. Mild spinal stenosis.   L3-4: Disc degeneration with spurring asymmetric on the left. Extraforaminal disc protrusion and spurring on the left. Moderate subarticular and foraminal stenosis on the left due to spurring. Mild facet degeneration   L4-5: Disc degeneration and spurring left greater than right. Moderate subarticular and foraminal stenosis on the left due to disc protrusion and spurring. Mild facet degeneration   L5-S1: Disc degeneration and disc bulging with small central disc protrusion. Mild facet degeneration. Moderate foraminal stenosis bilaterally due to spurring.   IMPRESSION: Lumbar levoscoliosis. Multilevel disc and facet degeneration throughout the lower thoracic and lumbar spine causing stenosis as above.   Negative for fracture or mass.     Electronically Signed   By: Franchot Gallo M.D.   On: 11/28/2019 17:58   She reports that she has been smoking. She has never used smokeless tobacco. No results for input(s): "HGBA1C", "LABURIC" in the last 8760 hours.  Objective:  VS:  HT:    WT:   BMI:     BP:   HR: bpm  TEMP: ( )  RESP:  Physical Exam Vitals and nursing note  reviewed.  HENT:     Head: Normocephalic and atraumatic.     Right Ear: External ear normal.     Left Ear: External ear normal.     Nose: Nose normal.     Mouth/Throat:     Mouth: Mucous membranes are moist.  Eyes:     Extraocular Movements: Extraocular movements intact.  Cardiovascular:     Rate and Rhythm: Normal rate.     Pulses: Normal pulses.  Pulmonary:     Effort: Pulmonary effort is normal.  Abdominal:     General: Abdomen is flat. There is no distension.  Musculoskeletal:        General: Tenderness present.     Cervical back: Normal range of motion.     Comments: Pt is slow to rise from seated position to standing. Concordant low back pain with facet loading, lumbar spine extension and rotation. Strong distal strength without clonus, no pain upon palpation of greater trochanters. Sensation intact bilaterally. Walks independently, gait steady.   Skin:    General: Skin is warm and dry.     Capillary Refill: Capillary refill takes less than 2 seconds.  Neurological:     General: No focal deficit present.     Mental Status: She is alert and oriented to person, place, and time.  Psychiatric:        Mood and Affect: Mood normal.        Behavior: Behavior normal.  Ortho Exam  Imaging: No results found.  Past Medical/Family/Surgical/Social History: Medications & Allergies reviewed per EMR, new medications updated. Patient Active Problem List   Diagnosis Date Noted   Medication management 10/02/2021   Bipolar I disorder, most recent episode depressed ([place])    Cellulitis of left breast 07/25/2021   Depression with anxiety 07/25/2021   Abscess of left breast 07/25/2021   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   Low back pain 09/24/2011   Restless legs syndrome (RLS) 03/24/2011   Depressive disorder, not elsewhere classified 09/17/2010   Bipolar I disorder, single manic episode ([place]) 08/20/2010   Benign essential hypertension 05/14/2010   Hyperlipemia 05/14/2010    Osteopenia 12/03/2009   Obsessive-compulsive disorder 10/17/2009   Past Medical History:  Diagnosis Date   Anxiety    Bipolar affective (HCC)    Chronic headaches    Depression    Hypercholesteremia    Hypertension    Restless leg syndrome    Family History  Problem Relation Age of Onset   Breast cancer Neg Hx    Past Surgical History:  Procedure Laterality Date   ABDOMINAL HYSTERECTOMY     OOPHORECTOMY     TONSILLECTOMY     Social History   Occupational History   Not on file  Tobacco Use   Smoking status: Some Days   Smokeless tobacco: Never  Substance and Sexual Activity   Alcohol use: No   Drug use: No   Sexual activity: Yes    Birth control/protection: Surgical

## 2022-06-23 ENCOUNTER — Other Ambulatory Visit: Payer: Self-pay | Admitting: Orthopaedic Surgery

## 2022-06-24 ENCOUNTER — Other Ambulatory Visit: Payer: Self-pay | Admitting: Physician Assistant

## 2022-06-24 DIAGNOSIS — R9389 Abnormal findings on diagnostic imaging of other specified body structures: Secondary | ICD-10-CM

## 2022-07-09 ENCOUNTER — Encounter: Payer: Self-pay | Admitting: Physical Medicine and Rehabilitation

## 2022-07-09 ENCOUNTER — Ambulatory Visit (INDEPENDENT_AMBULATORY_CARE_PROVIDER_SITE_OTHER): Payer: Medicare Other | Admitting: Physical Medicine and Rehabilitation

## 2022-07-09 ENCOUNTER — Ambulatory Visit: Payer: Self-pay

## 2022-07-09 VITALS — BP 174/83 | HR 68

## 2022-07-09 DIAGNOSIS — M47816 Spondylosis without myelopathy or radiculopathy, lumbar region: Secondary | ICD-10-CM | POA: Diagnosis not present

## 2022-07-09 MED ORDER — BUPIVACAINE HCL 0.5 % IJ SOLN
3.0000 mL | Freq: Once | INTRAMUSCULAR | Status: AC
  Administered 2022-07-09: 3 mL

## 2022-07-09 NOTE — Patient Instructions (Signed)

## 2022-07-09 NOTE — Progress Notes (Signed)
Pt state lower back pain. Pt state walking, standing and laying down makes the pain worse. Pt state she uses heat and ice and take over the counter pain meds tohelp ease her pain.  Numeric Pain Rating Scale and Functional Assessment Average Pain 7   In the last MONTH (on 0-10 scale) has pain interfered with the following?  1. General activity like being  able to carry out your everyday physical activities such as walking, climbing stairs, carrying groceries, or moving a chair?  Rating(10)   +Driver, -BT, -Dye Allergies.

## 2022-07-12 ENCOUNTER — Ambulatory Visit
Admission: RE | Admit: 2022-07-12 | Discharge: 2022-07-12 | Disposition: A | Discharge status: Home / Self Care | Payer: Medicare Other | Source: Ambulatory Visit | Attending: Physician Assistant | Admitting: Physician Assistant

## 2022-07-12 DIAGNOSIS — R9389 Abnormal findings on diagnostic imaging of other specified body structures: Secondary | ICD-10-CM

## 2022-07-12 MED ORDER — GADOBENATE DIMEGLUMINE 529 MG/ML IV SOLN
12.0000 mL | Freq: Once | INTRAVENOUS | Status: AC | PRN
  Administered 2022-07-12: 12 mL via INTRAVENOUS

## 2022-07-14 ENCOUNTER — Other Ambulatory Visit: Payer: Self-pay | Admitting: Physical Medicine and Rehabilitation

## 2022-07-14 DIAGNOSIS — M47816 Spondylosis without myelopathy or radiculopathy, lumbar region: Secondary | ICD-10-CM

## 2022-07-14 NOTE — Progress Notes (Signed)
Per pain diary 100% pain free after first set of diagnostic facet blocks. We will proceed with second set.

## 2022-07-16 NOTE — Progress Notes (Signed)
[name] Hinton - 61 y.o. female MRN [id number]  Date of birth: December 23, 1960  Office Visit Note: Visit Date: 07/09/2022 PCP: Pcp, No Referred by: Lorine Bears, NP  Subjective: Chief Complaint  Patient presents with   Lower Back - Pain   HPI:  [name] Hinton is a 61 y.o. female who comes in today at the request of Barnet Pall, FNP for planned Bilateral  L4-5 and L5-S1 Lumbar facet/medial branch block with fluoroscopic guidance.  The patient has failed conservative care including home exercise, medications, time and activity modification.  This injection will be diagnostic and hopefully therapeutic.  Please see requesting physician notes for further details and justification.  Exam has shown concordant pain with facet joint loading.   ROS Otherwise per HPI.  Assessment & Plan: Visit Diagnoses:    ICD-10-CM   1. Spondylosis without myelopathy or radiculopathy, lumbar region  M47.816 XR C-ARM NO REPORT    Facet Injection    bupivacaine (MARCAINE) 0.5 % (with pres) injection 3 mL      Plan: No additional findings.   Meds & Orders:  Meds ordered this encounter  Medications   bupivacaine (MARCAINE) 0.5 % (with pres) injection 3 mL    Orders Placed This Encounter  Procedures   Facet Injection   XR C-ARM NO REPORT    Follow-up: Return for Review Pain Diary.   Procedures: No procedures performed  Lumbar Diagnostic Facet Joint Nerve Block with Fluoroscopic Guidance   Patient: [name] Hinton      Date of Birth: 31-Mar-1961 MRN: [id number] PCP: Pcp, No      Visit Date: 07/09/2022   Universal Protocol:    Date/Time: 08/16/236:41 PM  Consent Given By: the patient  Position: PRONE  Additional Comments: Vital signs were monitored before and after the procedure. Patient was prepped and draped in the usual sterile fashion. The correct patient, procedure, and site was verified.   Injection Procedure Details:   Procedure diagnoses:  1. Spondylosis without myelopathy  or radiculopathy, lumbar region      Meds Administered:  Meds ordered this encounter  Medications   bupivacaine (MARCAINE) 0.5 % (with pres) injection 3 mL     Laterality: Bilateral  Location/Site: L4-L5, L3 and L4 medial branches and L5-S1, L4 medial branch and L5 dorsal ramus  Needle: 5.0 in., 25 ga.  Short bevel or Quincke spinal needle  Needle Placement: Oblique pedical  Findings:   -Comments: There was excellent flow of contrast along the articular pillars without intravascular flow.  Procedure Details: The fluoroscope beam is vertically oriented in AP and then obliqued 15 to 20 degrees to the ipsilateral side of the desired nerve to achieve the "Scotty dog" appearance.  The skin over the target area of the junction of the superior articulating process and the transverse process (sacral ala if blocking the L5 dorsal rami) was locally anesthetized with a 1 ml volume of 1% Lidocaine without Epinephrine.  The spinal needle was inserted and advanced in a trajectory view down to the target.   After contact with periosteum and negative aspirate for blood and CSF, correct placement without intravascular or epidural spread was confirmed by injecting 0.5 ml. of Isovue-250.  A spot radiograph was obtained of this image.    Next, a 0.5 ml. volume of the injectate described above was injected. The needle was then redirected to the other facet joint nerves mentioned above if needed.  Prior to the procedure, the patient was given a Pain Diary which  was completed for baseline measurements.  After the procedure, the patient rated their pain every 30 minutes and will continue rating at this frequency for a total of 5 hours.  The patient has been asked to complete the Diary and return to [place] by mail, fax or hand delivered as soon as possible.   Additional Comments:  The patient tolerated the procedure well Dressing: 2 x 2 sterile gauze and Band-Aid    Post-procedure details: Patient was  observed during the procedure. Post-procedure instructions were reviewed.  Patient left the clinic in stable condition.   Clinical History: EXAM: MRI LUMBAR SPINE WITHOUT CONTRAST   TECHNIQUE: Multiplanar, multisequence MR imaging of the lumbar spine was performed. No intravenous contrast was administered.   COMPARISON:  11/02/2019   FINDINGS: Segmentation:  Normal   Alignment: Mild retrolisthesis L1-2, L2-3, L3-4, L4-5. Mild anterolisthesis L5-S1. levoscoliosis at L1-2.   Vertebrae:  Negative for fracture or mass.   Conus medullaris and cauda equina: Conus extends to the L1 level. Conus and cauda equina appear normal.   Paraspinal and other soft tissues: Negative for paraspinous mass or adenopathy.   Disc levels:   T11-12: Left-sided disc protrusion. Mild flattening of the left side of the cord. Mild left foraminal narrowing   T12-L1: Extensive disc degeneration. Small central disc protrusion. Extruded disc fragment in the midline behind the L1 vertebral body. No significant spinal stenosis. Moderate left foraminal stenosis.   L1-2: Central disc protrusion with upgoing extruded disc fragment in the midline. Disc degeneration with endplate spurring right greater than left. Moderate subarticular stenosis on the right. Mild spinal stenosis.   L2-3: Disc degeneration with disc space narrowing and spurring left greater than right. Moderate to severe subarticular stenosis on the left. Mild spinal stenosis.   L3-4: Disc degeneration with spurring asymmetric on the left. Extraforaminal disc protrusion and spurring on the left. Moderate subarticular and foraminal stenosis on the left due to spurring. Mild facet degeneration   L4-5: Disc degeneration and spurring left greater than right. Moderate subarticular and foraminal stenosis on the left due to disc protrusion and spurring. Mild facet degeneration   L5-S1: Disc degeneration and disc bulging with small central  disc protrusion. Mild facet degeneration. Moderate foraminal stenosis bilaterally due to spurring.   IMPRESSION: Lumbar levoscoliosis. Multilevel disc and facet degeneration throughout the lower thoracic and lumbar spine causing stenosis as above.   Negative for fracture or mass.     Electronically Signed   By: Franchot Gallo M.D.   On: 11/28/2019 17:58     Objective:  VS:  HT:    WT:   BMI:     BP:(!) 174/83  HR:68bpm  TEMP: ( )  RESP:  Physical Exam Vitals and nursing note reviewed.  Constitutional:      General: She is not in acute distress.    Appearance: Normal appearance. She is not ill-appearing.  HENT:     Head: Normocephalic and atraumatic.     Right Ear: External ear normal.     Left Ear: External ear normal.  Eyes:     Extraocular Movements: Extraocular movements intact.  Cardiovascular:     Rate and Rhythm: Normal rate.     Pulses: Normal pulses.  Pulmonary:     Effort: Pulmonary effort is normal. No respiratory distress.  Abdominal:     General: There is no distension.     Palpations: Abdomen is soft.  Musculoskeletal:        General: Tenderness present.  Cervical back: Neck supple.     Right lower leg: No edema.     Left lower leg: No edema.     Comments: Patient has good distal strength with no pain over the greater trochanters.  No clonus or focal weakness.  Skin:    Findings: No erythema, lesion or rash.  Neurological:     General: No focal deficit present.     Mental Status: She is alert and oriented to person, place, and time.     Sensory: No sensory deficit.     Motor: No weakness or abnormal muscle tone.     Coordination: Coordination normal.  Psychiatric:        Mood and Affect: Mood normal.        Behavior: Behavior normal.      Imaging: No results found.

## 2022-07-16 NOTE — Procedures (Signed)
Lumbar Diagnostic Facet Joint Nerve Block with Fluoroscopic Guidance   Patient: [name] Hinton      Date of Birth: 07-20-1961 MRN: [id number] PCP: Pcp, No      Visit Date: 07/09/2022   Universal Protocol:    Date/Time: 08/16/236:41 PM  Consent Given By: the patient  Position: PRONE  Additional Comments: Vital signs were monitored before and after the procedure. Patient was prepped and draped in the usual sterile fashion. The correct patient, procedure, and site was verified.   Injection Procedure Details:   Procedure diagnoses:  1. Spondylosis without myelopathy or radiculopathy, lumbar region      Meds Administered:  Meds ordered this encounter  Medications   bupivacaine (MARCAINE) 0.5 % (with pres) injection 3 mL     Laterality: Bilateral  Location/Site: L4-L5, L3 and L4 medial branches and L5-S1, L4 medial branch and L5 dorsal ramus  Needle: 5.0 in., 25 ga.  Short bevel or Quincke spinal needle  Needle Placement: Oblique pedical  Findings:   -Comments: There was excellent flow of contrast along the articular pillars without intravascular flow.  Procedure Details: The fluoroscope beam is vertically oriented in AP and then obliqued 15 to 20 degrees to the ipsilateral side of the desired nerve to achieve the "Scotty dog" appearance.  The skin over the target area of the junction of the superior articulating process and the transverse process (sacral ala if blocking the L5 dorsal rami) was locally anesthetized with a 1 ml volume of 1% Lidocaine without Epinephrine.  The spinal needle was inserted and advanced in a trajectory view down to the target.   After contact with periosteum and negative aspirate for blood and CSF, correct placement without intravascular or epidural spread was confirmed by injecting 0.5 ml. of Isovue-250.  A spot radiograph was obtained of this image.    Next, a 0.5 ml. volume of the injectate described above was injected. The needle was then  redirected to the other facet joint nerves mentioned above if needed.  Prior to the procedure, the patient was given a Pain Diary which was completed for baseline measurements.  After the procedure, the patient rated their pain every 30 minutes and will continue rating at this frequency for a total of 5 hours.  The patient has been asked to complete the Diary and return to [place] by mail, fax or hand delivered as soon as possible.   Additional Comments:  The patient tolerated the procedure well Dressing: 2 x 2 sterile gauze and Band-Aid    Post-procedure details: Patient was observed during the procedure. Post-procedure instructions were reviewed.  Patient left the clinic in stable condition.

## 2022-07-24 ENCOUNTER — Encounter: Payer: Self-pay | Admitting: Physician Assistant

## 2022-07-24 ENCOUNTER — Ambulatory Visit (INDEPENDENT_AMBULATORY_CARE_PROVIDER_SITE_OTHER): Payer: Medicare Other | Admitting: Physician Assistant

## 2022-07-24 DIAGNOSIS — M25511 Pain in right shoulder: Secondary | ICD-10-CM

## 2022-07-24 NOTE — Progress Notes (Signed)
HPI: [name] Hinton returns today for follow-up of her right shoulder.  She has been unable to get the therapy.  She has had several friends with medical issues 1 and actually passed away.  She still having right shoulder pain.  She is taking Aleve and Zanaflex for the shoulder pain.  States that her pain is similar to what she was having prior to the injection.  Review of systems see HPI otherwise negative  Physical exam: Bilateral shoulders: External and internal rotation against resistance 5 out of 5.  Empty can test negative bilaterally.  Impingement testing negative bilaterally.  She has full overhead activity of both shoulders with minimal discomfort with right overhead activity.  Impression: Acute right shoulder pain  Plan: Recommend she go to formal physical therapy if she does not improve with therapy on her shoulder next 2 to 3 weeks and obtain an MRI to rule out internal derangement.  Questions encouraged and answered at length.

## 2022-07-31 ENCOUNTER — Ambulatory Visit: Payer: Self-pay

## 2022-07-31 ENCOUNTER — Encounter: Payer: Self-pay | Admitting: Physical Medicine and Rehabilitation

## 2022-07-31 ENCOUNTER — Ambulatory Visit (INDEPENDENT_AMBULATORY_CARE_PROVIDER_SITE_OTHER): Payer: Medicare Other | Admitting: Physical Medicine and Rehabilitation

## 2022-07-31 VITALS — BP 136/85 | HR 80

## 2022-07-31 DIAGNOSIS — M47816 Spondylosis without myelopathy or radiculopathy, lumbar region: Secondary | ICD-10-CM | POA: Diagnosis not present

## 2022-07-31 MED ORDER — BUPIVACAINE HCL 0.5 % IJ SOLN
3.0000 mL | Freq: Once | INTRAMUSCULAR | Status: AC
  Administered 2022-07-31: 3 mL

## 2022-07-31 NOTE — Patient Instructions (Signed)

## 2022-07-31 NOTE — Progress Notes (Signed)
Pt state lower back pain. Pt state walking, standing and laying down makes the pain worse. Pt state she uses heat and ice and take over the counter pain meds to help ease her pain.  Numeric Pain Rating Scale and Functional Assessment Average Pain 4   In the last MONTH (on 0-10 scale) has pain interfered with the following?  1. General activity like being  able to carry out your everyday physical activities such as walking, climbing stairs, carrying groceries, or moving a chair?  Rating(8)   +Driver, -BT, -Dye Allergies.

## 2022-08-05 ENCOUNTER — Other Ambulatory Visit: Payer: Self-pay | Admitting: Physical Medicine and Rehabilitation

## 2022-08-05 DIAGNOSIS — M47816 Spondylosis without myelopathy or radiculopathy, lumbar region: Secondary | ICD-10-CM

## 2022-08-05 NOTE — Progress Notes (Signed)
Per pain diary patient reports 100% pain relief with second set of diagnostic facet blocks. We will proceed with radiofrequency ablation procedure.

## 2022-08-06 NOTE — Procedures (Signed)
Lumbar Diagnostic Facet Joint Nerve Block with Fluoroscopic Guidance   Patient: [name] Hinton      Date of Birth: Aug 03, 1961 MRN: [id number] PCP: Pcp, No      Visit Date: 07/31/2022   Universal Protocol:    Date/Time: 09/06/237:28 PM  Consent Given By: the patient  Position: PRONE  Additional Comments: Vital signs were monitored before and after the procedure. Patient was prepped and draped in the usual sterile fashion. The correct patient, procedure, and site was verified.   Injection Procedure Details:   Procedure diagnoses:  1. Spondylosis without myelopathy or radiculopathy, lumbar region      Meds Administered:  Meds ordered this encounter  Medications   bupivacaine (MARCAINE) 0.5 % (with pres) injection 3 mL     Laterality: Bilateral  Location/Site: L4-L5, L3 and L4 medial branches and L5-S1, L4 medial branch and L5 dorsal ramus  Needle: 5.0 in., 25 ga.  Short bevel or Quincke spinal needle  Needle Placement: Oblique pedical  Findings:   -Comments: There was excellent flow of contrast along the articular pillars without intravascular flow.  Procedure Details: The fluoroscope beam is vertically oriented in AP and then obliqued 15 to 20 degrees to the ipsilateral side of the desired nerve to achieve the "Scotty dog" appearance.  The skin over the target area of the junction of the superior articulating process and the transverse process (sacral ala if blocking the L5 dorsal rami) was locally anesthetized with a 1 ml volume of 1% Lidocaine without Epinephrine.  The spinal needle was inserted and advanced in a trajectory view down to the target.   After contact with periosteum and negative aspirate for blood and CSF, correct placement without intravascular or epidural spread was confirmed by injecting 0.5 ml. of Isovue-250.  A spot radiograph was obtained of this image.    Next, a 0.5 ml. volume of the injectate described above was injected. The needle was then  redirected to the other facet joint nerves mentioned above if needed.  Prior to the procedure, the patient was given a Pain Diary which was completed for baseline measurements.  After the procedure, the patient rated their pain every 30 minutes and will continue rating at this frequency for a total of 5 hours.  The patient has been asked to complete the Diary and return to [place] by mail, fax or hand delivered as soon as possible.   Additional Comments:  The patient tolerated the procedure well Dressing: 2 x 2 sterile gauze and Band-Aid    Post-procedure details: Patient was observed during the procedure. Post-procedure instructions were reviewed.  Patient left the clinic in stable condition.

## 2022-08-06 NOTE — Progress Notes (Signed)
[name] Hinton - 61 y.o. female MRN [id number]  Date of birth: Aug 03, 1961  Office Visit Note: Visit Date: 07/31/2022 PCP: Pcp, No Referred by: Lorine Bears, NP  Subjective: Chief Complaint  Patient presents with   Lower Back - Pain   HPI:  [name] Hinton is a 61 y.o. female who comes in today for planned repeat Bilateral L4-5 and L5-S1 Lumbar facet/medial branch block with fluoroscopic guidance.  The patient has failed conservative care including home exercise, medications, time and activity modification.  This injection will be diagnostic and hopefully therapeutic.  Please see requesting physician notes for further details and justification.  Exam shows concordant low back pain with facet joint loading and extension. Patient received more than 80% pain relief from prior injection. This would be the second block in a diagnostic double block paradigm.     Referring:Megan Jimmye Norman, FNP   ROS Otherwise per HPI.  Assessment & Plan: Visit Diagnoses:    ICD-10-CM   1. Spondylosis without myelopathy or radiculopathy, lumbar region  M47.816 XR C-ARM NO REPORT    Facet Injection    bupivacaine (MARCAINE) 0.5 % (with pres) injection 3 mL      Plan: No additional findings.   Meds & Orders:  Meds ordered this encounter  Medications   bupivacaine (MARCAINE) 0.5 % (with pres) injection 3 mL    Orders Placed This Encounter  Procedures   Facet Injection   XR C-ARM NO REPORT    Follow-up: Return for Review Pain Diary.   Procedures: No procedures performed  Lumbar Diagnostic Facet Joint Nerve Block with Fluoroscopic Guidance   Patient: [name] Hinton      Date of Birth: 1961-01-17 MRN: [id number] PCP: Pcp, No      Visit Date: 07/31/2022   Universal Protocol:    Date/Time: 09/06/237:28 PM  Consent Given By: the patient  Position: PRONE  Additional Comments: Vital signs were monitored before and after the procedure. Patient was prepped and draped in the usual sterile  fashion. The correct patient, procedure, and site was verified.   Injection Procedure Details:   Procedure diagnoses:  1. Spondylosis without myelopathy or radiculopathy, lumbar region      Meds Administered:  Meds ordered this encounter  Medications   bupivacaine (MARCAINE) 0.5 % (with pres) injection 3 mL     Laterality: Bilateral  Location/Site: L4-L5, L3 and L4 medial branches and L5-S1, L4 medial branch and L5 dorsal ramus  Needle: 5.0 in., 25 ga.  Short bevel or Quincke spinal needle  Needle Placement: Oblique pedical  Findings:   -Comments: There was excellent flow of contrast along the articular pillars without intravascular flow.  Procedure Details: The fluoroscope beam is vertically oriented in AP and then obliqued 15 to 20 degrees to the ipsilateral side of the desired nerve to achieve the "Scotty dog" appearance.  The skin over the target area of the junction of the superior articulating process and the transverse process (sacral ala if blocking the L5 dorsal rami) was locally anesthetized with a 1 ml volume of 1% Lidocaine without Epinephrine.  The spinal needle was inserted and advanced in a trajectory view down to the target.   After contact with periosteum and negative aspirate for blood and CSF, correct placement without intravascular or epidural spread was confirmed by injecting 0.5 ml. of Isovue-250.  A spot radiograph was obtained of this image.    Next, a 0.5 ml. volume of the injectate described above was injected. The needle was  then redirected to the other facet joint nerves mentioned above if needed.  Prior to the procedure, the patient was given a Pain Diary which was completed for baseline measurements.  After the procedure, the patient rated their pain every 30 minutes and will continue rating at this frequency for a total of 5 hours.  The patient has been asked to complete the Diary and return to [place] by mail, fax or hand delivered as soon as  possible.   Additional Comments:  The patient tolerated the procedure well Dressing: 2 x 2 sterile gauze and Band-Aid    Post-procedure details: Patient was observed during the procedure. Post-procedure instructions were reviewed.  Patient left the clinic in stable condition.   Clinical History: EXAM: MRI LUMBAR SPINE WITHOUT CONTRAST   TECHNIQUE: Multiplanar, multisequence MR imaging of the lumbar spine was performed. No intravenous contrast was administered.   COMPARISON:  11/02/2019   FINDINGS: Segmentation:  Normal   Alignment: Mild retrolisthesis L1-2, L2-3, L3-4, L4-5. Mild anterolisthesis L5-S1. levoscoliosis at L1-2.   Vertebrae:  Negative for fracture or mass.   Conus medullaris and cauda equina: Conus extends to the L1 level. Conus and cauda equina appear normal.   Paraspinal and other soft tissues: Negative for paraspinous mass or adenopathy.   Disc levels:   T11-12: Left-sided disc protrusion. Mild flattening of the left side of the cord. Mild left foraminal narrowing   T12-L1: Extensive disc degeneration. Small central disc protrusion. Extruded disc fragment in the midline behind the L1 vertebral body. No significant spinal stenosis. Moderate left foraminal stenosis.   L1-2: Central disc protrusion with upgoing extruded disc fragment in the midline. Disc degeneration with endplate spurring right greater than left. Moderate subarticular stenosis on the right. Mild spinal stenosis.   L2-3: Disc degeneration with disc space narrowing and spurring left greater than right. Moderate to severe subarticular stenosis on the left. Mild spinal stenosis.   L3-4: Disc degeneration with spurring asymmetric on the left. Extraforaminal disc protrusion and spurring on the left. Moderate subarticular and foraminal stenosis on the left due to spurring. Mild facet degeneration   L4-5: Disc degeneration and spurring left greater than right. Moderate subarticular  and foraminal stenosis on the left due to disc protrusion and spurring. Mild facet degeneration   L5-S1: Disc degeneration and disc bulging with small central disc protrusion. Mild facet degeneration. Moderate foraminal stenosis bilaterally due to spurring.   IMPRESSION: Lumbar levoscoliosis. Multilevel disc and facet degeneration throughout the lower thoracic and lumbar spine causing stenosis as above.   Negative for fracture or mass.     Electronically Signed   By: Franchot Gallo M.D.   On: 11/28/2019 17:58     Objective:  VS:  HT:    WT:   BMI:     BP:136/85  HR:80bpm  TEMP: ( )  RESP:  Physical Exam Vitals and nursing note reviewed.  Constitutional:      General: She is not in acute distress.    Appearance: Normal appearance. She is not ill-appearing.  HENT:     Head: Normocephalic and atraumatic.     Right Ear: External ear normal.     Left Ear: External ear normal.  Eyes:     Extraocular Movements: Extraocular movements intact.  Cardiovascular:     Rate and Rhythm: Normal rate.     Pulses: Normal pulses.  Pulmonary:     Effort: Pulmonary effort is normal. No respiratory distress.  Abdominal:     General: There is no  distension.     Palpations: Abdomen is soft.  Musculoskeletal:        General: Tenderness present.     Cervical back: Neck supple.     Right lower leg: No edema.     Left lower leg: No edema.     Comments: Patient has good distal strength with no pain over the greater trochanters.  No clonus or focal weakness.  Skin:    Findings: No erythema, lesion or rash.  Neurological:     General: No focal deficit present.     Mental Status: She is alert and oriented to person, place, and time.     Sensory: No sensory deficit.     Motor: No weakness or abnormal muscle tone.     Coordination: Coordination normal.  Psychiatric:        Mood and Affect: Mood normal.        Behavior: Behavior normal.      Imaging: No results found.

## 2022-09-04 ENCOUNTER — Telehealth: Payer: Self-pay | Admitting: Physical Medicine and Rehabilitation

## 2022-09-04 ENCOUNTER — Encounter: Payer: Medicare Other | Admitting: Physical Medicine and Rehabilitation

## 2022-09-04 NOTE — Telephone Encounter (Signed)
Patient called needing to CX her appointment/ Patient said she is not feeling well. The number to contact patient is [phone]

## 2022-09-04 NOTE — Telephone Encounter (Signed)
rescheduled

## 2022-09-11 ENCOUNTER — Encounter: Payer: Medicare Other | Admitting: Physical Medicine and Rehabilitation

## 2022-09-12 ENCOUNTER — Other Ambulatory Visit: Payer: Self-pay | Admitting: Physician Assistant

## 2022-10-07 ENCOUNTER — Ambulatory Visit (INDEPENDENT_AMBULATORY_CARE_PROVIDER_SITE_OTHER): Payer: Medicare Other | Admitting: Physical Medicine and Rehabilitation

## 2022-10-07 ENCOUNTER — Ambulatory Visit: Payer: Self-pay

## 2022-10-07 VITALS — BP 164/91 | HR 73

## 2022-10-07 DIAGNOSIS — M47816 Spondylosis without myelopathy or radiculopathy, lumbar region: Secondary | ICD-10-CM | POA: Diagnosis not present

## 2022-10-07 MED ORDER — GABAPENTIN 300 MG PO CAPS: 300.0000 mg | ORAL_CAPSULE | Freq: Three times a day (TID) | ORAL | 2 refills | Status: DC

## 2022-10-07 MED ORDER — METHYLPREDNISOLONE ACETATE 80 MG/ML IJ SUSP
40.0000 mg | Freq: Once | INTRAMUSCULAR | Status: AC
  Administered 2022-10-07: 40 mg

## 2022-10-07 NOTE — Patient Instructions (Signed)

## 2022-10-07 NOTE — Progress Notes (Signed)
Numeric Pain Rating Scale and Functional Assessment Average Pain 0   In the last MONTH (on 0-10 scale) has pain interfered with the following?  1. General activity like being  able to carry out your everyday physical activities such as walking, climbing stairs, carrying groceries, or moving a chair?  Rating(9)   +Driver, -BT, -Dye Allergies.  Lower back pain, no radiation down the legs

## 2022-10-09 ENCOUNTER — Other Ambulatory Visit: Payer: Self-pay | Admitting: Physician Assistant

## 2022-10-09 DIAGNOSIS — N6324 Unspecified lump in the left breast, lower inner quadrant: Secondary | ICD-10-CM

## 2022-10-14 ENCOUNTER — Ambulatory Visit: Payer: Self-pay

## 2022-10-14 ENCOUNTER — Ambulatory Visit (INDEPENDENT_AMBULATORY_CARE_PROVIDER_SITE_OTHER): Payer: Medicare Other | Admitting: Physical Medicine and Rehabilitation

## 2022-10-14 VITALS — BP 156/101 | HR 66

## 2022-10-14 DIAGNOSIS — M47816 Spondylosis without myelopathy or radiculopathy, lumbar region: Secondary | ICD-10-CM

## 2022-10-14 MED ORDER — METHYLPREDNISOLONE ACETATE 80 MG/ML IJ SUSP
40.0000 mg | Freq: Once | INTRAMUSCULAR | Status: AC
  Administered 2022-10-14: 40 mg

## 2022-10-14 NOTE — Progress Notes (Signed)
Numeric Pain Rating Scale and Functional Assessment Average Pain 0   In the last MONTH (on 0-10 scale) has pain interfered with the following?  1. General activity like being  able to carry out your everyday physical activities such as walking, climbing stairs, carrying groceries, or moving a chair?  Rating(7)   +Driver, -BT, -Dye Allergies.  Walking, standing, carrying heavy things and twisting makes pain worse

## 2022-10-14 NOTE — Patient Instructions (Signed)

## 2022-10-14 NOTE — Progress Notes (Signed)
[name] Hinton - 61 y.o. female MRN [id number]  Date of birth: 10-07-1961  Office Visit Note: Visit Date: 10/07/2022 PCP: Pcp, No Referred by: Lorine Bears, NP  Subjective: Chief Complaint  Patient presents with   Lower Back - Pain   HPI:  [name] Hinton is a 61 y.o. female who comes in todayfor planned radiofrequency ablation of the Left L4-5 and L5-S1 Lumbar facet joints. This would be ablation of the corresponding medial branches and/or dorsal rami.  Patient has had double diagnostic blocks with more than 50% relief.  These are documented on pain diary.  They have had chronic back pain for quite some time, more than 3 months, which has been an ongoing situation with recalcitrant axial back pain.  They have no radicular pain.  Their axial pain is worse with standing and ambulating and on exam today with facet loading.  They have had physical therapy as well as home exercise program.  The imaging noted in the chart below indicated facet pathology. Accordingly they meet all the criteria and qualification for for radiofrequency ablation and we are going to complete this today hopefully for more longer term relief as part of comprehensive management program.    ROS Otherwise per HPI.  Assessment & Plan: Visit Diagnoses:    ICD-10-CM   1. Spondylosis without myelopathy or radiculopathy, lumbar region  M47.816 XR C-ARM NO REPORT    methylPREDNISolone acetate (DEPO-MEDROL) injection 40 mg    Radiofrequency,Lumbar      Plan: No additional findings.   Meds & Orders:  Meds ordered this encounter  Medications   gabapentin (NEURONTIN) 300 MG capsule    Sig: Take 1 capsule (300 mg total) by mouth 3 (three) times daily.    Dispense:  270 capsule    Refill:  2   methylPREDNISolone acetate (DEPO-MEDROL) injection 40 mg    Orders Placed This Encounter  Procedures   Radiofrequency,Lumbar   XR C-ARM NO REPORT    Follow-up: Return if symptoms worsen or fail to improve.    Procedures: No procedures performed  Lumbar Facet Joint Nerve Denervation  Patient: [name] Hinton      Date of Birth: 10-11-1961 MRN: [id number] PCP: Pcp, No      Visit Date: 10/07/2022   Universal Protocol:    Date/Time: 11/14/239:35 AM  Consent Given By: the patient  Position: PRONE  Additional Comments: Vital signs were monitored before and after the procedure. Patient was prepped and draped in the usual sterile fashion. The correct patient, procedure, and site was verified.   Injection Procedure Details:   Procedure diagnoses:  1. Spondylosis without myelopathy or radiculopathy, lumbar region      Meds Administered:  Meds ordered this encounter  Medications   gabapentin (NEURONTIN) 300 MG capsule    Sig: Take 1 capsule (300 mg total) by mouth 3 (three) times daily.    Dispense:  270 capsule    Refill:  2   methylPREDNISolone acetate (DEPO-MEDROL) injection 40 mg     Laterality: Left  Location/Site:  L4-L5, L3 and L4 medial branches and L5-S1, L4 medial branch and L5 dorsal ramus  Needle: 18 ga.,  51m active tip, 1061mRF Cannula  Needle Placement: Along juncture of superior articular process and transverse pocess  Findings:  -Comments:  Procedure Details: For each desired target nerve, the corresponding transverse process (sacral ala for the L5 dorsal rami) was identified and the fluoroscope was positioned to square off the endplates of the corresponding vertebral  body to achieve a true AP midline view.  The beam was then obliqued 15 to 20 degrees and caudally tilted 15 to 20 degrees to line up a trajectory along the target nerves. The skin over the target of the junction of superior articulating process and transverse process (sacral ala for the L5 dorsal rami) was infiltrated with 42m of 1% Lidocaine without Epinephrine.  The 18 gauge 156mactive tip outer cannula was advanced in trajectory view to the target.  This procedure was repeated for each  target nerve.  Then, for all levels, the outer cannula placement was fine-tuned and the position was then confirmed with bi-planar imaging.    Test stimulation was done both at sensory and motor levels to ensure there was no radicular stimulation. The target tissues were then infiltrated with 1 ml of 1% Lidocaine without Epinephrine. Subsequently, a percutaneous neurotomy was carried out for 90 seconds at 80 degrees Celsius.  After the completion of the lesion, 1 ml of injectate was delivered. It was then repeated for each facet joint nerve mentioned above. Appropriate radiographs were obtained to verify the probe placement during the neurotomy.   Additional Comments:  The patient tolerated the procedure well Dressing: 2 x 2 sterile gauze and Band-Aid    Post-procedure details: Patient was observed during the procedure. Post-procedure instructions were reviewed.  Patient left the clinic in stable condition.       Clinical History: EXAM: MRI LUMBAR SPINE WITHOUT CONTRAST   TECHNIQUE: Multiplanar, multisequence MR imaging of the lumbar spine was performed. No intravenous contrast was administered.   COMPARISON:  11/02/2019   FINDINGS: Segmentation:  Normal   Alignment: Mild retrolisthesis L1-2, L2-3, L3-4, L4-5. Mild anterolisthesis L5-S1. levoscoliosis at L1-2.   Vertebrae:  Negative for fracture or mass.   Conus medullaris and cauda equina: Conus extends to the L1 level. Conus and cauda equina appear normal.   Paraspinal and other soft tissues: Negative for paraspinous mass or adenopathy.   Disc levels:   T11-12: Left-sided disc protrusion. Mild flattening of the left side of the cord. Mild left foraminal narrowing   T12-L1: Extensive disc degeneration. Small central disc protrusion. Extruded disc fragment in the midline behind the L1 vertebral body. No significant spinal stenosis. Moderate left foraminal stenosis.   L1-2: Central disc protrusion with upgoing  extruded disc fragment in the midline. Disc degeneration with endplate spurring right greater than left. Moderate subarticular stenosis on the right. Mild spinal stenosis.   L2-3: Disc degeneration with disc space narrowing and spurring left greater than right. Moderate to severe subarticular stenosis on the left. Mild spinal stenosis.   L3-4: Disc degeneration with spurring asymmetric on the left. Extraforaminal disc protrusion and spurring on the left. Moderate subarticular and foraminal stenosis on the left due to spurring. Mild facet degeneration   L4-5: Disc degeneration and spurring left greater than right. Moderate subarticular and foraminal stenosis on the left due to disc protrusion and spurring. Mild facet degeneration   L5-S1: Disc degeneration and disc bulging with small central disc protrusion. Mild facet degeneration. Moderate foraminal stenosis bilaterally due to spurring.   IMPRESSION: Lumbar levoscoliosis. Multilevel disc and facet degeneration throughout the lower thoracic and lumbar spine causing stenosis as above.   Negative for fracture or mass.     Electronically Signed   By: ChFranchot Gallo.D.   On: 11/28/2019 17:58     Objective:  VS:  HT:    WT:   BMI:     BP:(!) 164/91  HR:73bpm  TEMP: ( )  RESP:  Physical Exam Vitals and nursing note reviewed.  Constitutional:      General: She is not in acute distress.    Appearance: Normal appearance. She is not ill-appearing.  HENT:     Head: Normocephalic and atraumatic.     Right Ear: External ear normal.     Left Ear: External ear normal.  Eyes:     Extraocular Movements: Extraocular movements intact.  Cardiovascular:     Rate and Rhythm: Normal rate.     Pulses: Normal pulses.  Pulmonary:     Effort: Pulmonary effort is normal. No respiratory distress.  Abdominal:     General: There is no distension.     Palpations: Abdomen is soft.  Musculoskeletal:        General: Tenderness present.      Cervical back: Neck supple.     Right lower leg: No edema.     Left lower leg: No edema.     Comments: Patient has good distal strength with no pain over the greater trochanters.  No clonus or focal weakness.  Skin:    Findings: No erythema, lesion or rash.  Neurological:     General: No focal deficit present.     Mental Status: She is alert and oriented to person, place, and time.     Sensory: No sensory deficit.     Motor: No weakness or abnormal muscle tone.     Coordination: Coordination normal.  Psychiatric:        Mood and Affect: Mood normal.        Behavior: Behavior normal.      Imaging: No results found.

## 2022-10-14 NOTE — Procedures (Signed)
Lumbar Facet Joint Nerve Denervation  Patient: [name] Hinton      Date of Birth: 1961/06/14 MRN: [id number] PCP: Pcp, No      Visit Date: 10/07/2022   Universal Protocol:    Date/Time: 11/14/239:35 AM  Consent Given By: the patient  Position: PRONE  Additional Comments: Vital signs were monitored before and after the procedure. Patient was prepped and draped in the usual sterile fashion. The correct patient, procedure, and site was verified.   Injection Procedure Details:   Procedure diagnoses:  1. Spondylosis without myelopathy or radiculopathy, lumbar region      Meds Administered:  Meds ordered this encounter  Medications   gabapentin (NEURONTIN) 300 MG capsule    Sig: Take 1 capsule (300 mg total) by mouth 3 (three) times daily.    Dispense:  270 capsule    Refill:  2   methylPREDNISolone acetate (DEPO-MEDROL) injection 40 mg     Laterality: Left  Location/Site:  L4-L5, L3 and L4 medial branches and L5-S1, L4 medial branch and L5 dorsal ramus  Needle: 18 ga.,  82m active tip, 1079mRF Cannula  Needle Placement: Along juncture of superior articular process and transverse pocess  Findings:  -Comments:  Procedure Details: For each desired target nerve, the corresponding transverse process (sacral ala for the L5 dorsal rami) was identified and the fluoroscope was positioned to square off the endplates of the corresponding vertebral body to achieve a true AP midline view.  The beam was then obliqued 15 to 20 degrees and caudally tilted 15 to 20 degrees to line up a trajectory along the target nerves. The skin over the target of the junction of superior articulating process and transverse process (sacral ala for the L5 dorsal rami) was infiltrated with 48m548mf 1% Lidocaine without Epinephrine.  The 18 gauge 32m38mtive tip outer cannula was advanced in trajectory view to the target.  This procedure was repeated for each target nerve.  Then, for all levels, the outer  cannula placement was fine-tuned and the position was then confirmed with bi-planar imaging.    Test stimulation was done both at sensory and motor levels to ensure there was no radicular stimulation. The target tissues were then infiltrated with 1 ml of 1% Lidocaine without Epinephrine. Subsequently, a percutaneous neurotomy was carried out for 90 seconds at 80 degrees Celsius.  After the completion of the lesion, 1 ml of injectate was delivered. It was then repeated for each facet joint nerve mentioned above. Appropriate radiographs were obtained to verify the probe placement during the neurotomy.   Additional Comments:  The patient tolerated the procedure well Dressing: 2 x 2 sterile gauze and Band-Aid    Post-procedure details: Patient was observed during the procedure. Post-procedure instructions were reviewed.  Patient left the clinic in stable condition.

## 2022-10-22 ENCOUNTER — Other Ambulatory Visit: Payer: Self-pay | Admitting: Physician Assistant

## 2022-10-22 DIAGNOSIS — N6324 Unspecified lump in the left breast, lower inner quadrant: Secondary | ICD-10-CM

## 2022-10-22 NOTE — Procedures (Signed)
Lumbar Facet Joint Nerve Denervation  Patient: [name] Hinton      Date of Birth: [birth year] MRN: [id number] PCP: Pcp, No      Visit Date: 10/14/2022   Universal Protocol:    Date/Time: 11/22/232:36 PM  Consent Given By: the patient  Position: PRONE  Additional Comments: Vital signs were monitored before and after the procedure. Patient was prepped and draped in the usual sterile fashion. The correct patient, procedure, and site was verified.   Injection Procedure Details:   Procedure diagnoses:  1. Spondylosis without myelopathy or radiculopathy, lumbar region      Meds Administered:  Meds ordered this encounter  Medications   methylPREDNISolone acetate (DEPO-MEDROL) injection 40 mg     Laterality: Right  Location/Site:  L4-L5, L3 and L4 medial branches and L5-S1, L4 medial branch and L5 dorsal ramus  Needle: 18 ga.,  46m active tip, 1056mRF Cannula  Needle Placement: Along juncture of superior articular process and transverse pocess  Findings:  -Comments:  Procedure Details: For each desired target nerve, the corresponding transverse process (sacral ala for the L5 dorsal rami) was identified and the fluoroscope was positioned to square off the endplates of the corresponding vertebral body to achieve a true AP midline view.  The beam was then obliqued 15 to 20 degrees and caudally tilted 15 to 20 degrees to line up a trajectory along the target nerves. The skin over the target of the junction of superior articulating process and transverse process (sacral ala for the L5 dorsal rami) was infiltrated with 56m53mf 1% Lidocaine without Epinephrine.  The 18 gauge 87m44mtive tip outer cannula was advanced in trajectory view to the target.  This procedure was repeated for each target nerve.  Then, for all levels, the outer cannula placement was fine-tuned and the position was then confirmed with bi-planar imaging.    Test stimulation was done both at sensory and motor  levels to ensure there was no radicular stimulation. The target tissues were then infiltrated with 1 ml of 1% Lidocaine without Epinephrine. Subsequently, a percutaneous neurotomy was carried out for 90 seconds at 80 degrees Celsius.  After the completion of the lesion, 1 ml of injectate was delivered. It was then repeated for each facet joint nerve mentioned above. Appropriate radiographs were obtained to verify the probe placement during the neurotomy.   Additional Comments:  The patient tolerated the procedure well Dressing: 2 x 2 sterile gauze and Band-Aid    Post-procedure details: Patient was observed during the procedure. Post-procedure instructions were reviewed.  Patient left the clinic in stable condition.

## 2022-10-22 NOTE — Progress Notes (Signed)
[name] Hinton - 61 y.o. female MRN [id number]  Date of birth: 1961/05/21  Office Visit Note: Visit Date: 10/14/2022 PCP: Pcp, No Referred by: No ref. provider found  Subjective: Chief Complaint  Patient presents with   Lower Back - Pain   HPI:  [name] is a 61 y.o. female who comes in todayfor planned radiofrequency ablation of the Right L4-5 and L5-S1 Lumbar facet joints. This would be ablation of the corresponding medial branches and/or dorsal rami.  Patient has had double diagnostic blocks with more than 50% relief.  These are documented on pain diary.  They have had chronic back pain for quite some time, more than 3 months, which has been an ongoing situation with recalcitrant axial back pain.  They have no radicular pain.  Their axial pain is worse with standing and ambulating and on exam today with facet loading.  They have had physical therapy as well as home exercise program.  The imaging noted in the chart below indicated facet pathology. Accordingly they meet all the criteria and qualification for for radiofrequency ablation and we are going to complete this today hopefully for more longer term relief as part of comprehensive management program.   ROS Otherwise per HPI.  Assessment & Plan: Visit Diagnoses:    ICD-10-CM   1. Spondylosis without myelopathy or radiculopathy, lumbar region  M47.816 XR C-ARM NO REPORT    Radiofrequency,Lumbar    methylPREDNISolone acetate (DEPO-MEDROL) injection 40 mg      Plan: No additional findings.   Meds & Orders:  Meds ordered this encounter  Medications   methylPREDNISolone acetate (DEPO-MEDROL) injection 40 mg    Orders Placed This Encounter  Procedures   Radiofrequency,Lumbar   XR C-ARM NO REPORT    Follow-up: Return for visit to requesting provider as needed.   Procedures: No procedures performed  Lumbar Facet Joint Nerve Denervation  Patient: [name] Hinton      Date of Birth: 1961-10-28 MRN: [id number] PCP:  Pcp, No      Visit Date: 10/14/2022   Universal Protocol:    Date/Time: 11/22/232:36 PM  Consent Given By: the patient  Position: PRONE  Additional Comments: Vital signs were monitored before and after the procedure. Patient was prepped and draped in the usual sterile fashion. The correct patient, procedure, and site was verified.   Injection Procedure Details:   Procedure diagnoses:  1. Spondylosis without myelopathy or radiculopathy, lumbar region      Meds Administered:  Meds ordered this encounter  Medications   methylPREDNISolone acetate (DEPO-MEDROL) injection 40 mg     Laterality: Right  Location/Site:  L4-L5, L3 and L4 medial branches and L5-S1, L4 medial branch and L5 dorsal ramus  Needle: 18 ga.,  29m active tip, 1036mRF Cannula  Needle Placement: Along juncture of superior articular process and transverse pocess  Findings:  -Comments:  Procedure Details: For each desired target nerve, the corresponding transverse process (sacral ala for the L5 dorsal rami) was identified and the fluoroscope was positioned to square off the endplates of the corresponding vertebral body to achieve a true AP midline view.  The beam was then obliqued 15 to 20 degrees and caudally tilted 15 to 20 degrees to line up a trajectory along the target nerves. The skin over the target of the junction of superior articulating process and transverse process (sacral ala for the L5 dorsal rami) was infiltrated with 30m55mf 1% Lidocaine without Epinephrine.  The 18 gauge 64m56mtive tip outer  cannula was advanced in trajectory view to the target.  This procedure was repeated for each target nerve.  Then, for all levels, the outer cannula placement was fine-tuned and the position was then confirmed with bi-planar imaging.    Test stimulation was done both at sensory and motor levels to ensure there was no radicular stimulation. The target tissues were then infiltrated with 1 ml of 1% Lidocaine  without Epinephrine. Subsequently, a percutaneous neurotomy was carried out for 90 seconds at 80 degrees Celsius.  After the completion of the lesion, 1 ml of injectate was delivered. It was then repeated for each facet joint nerve mentioned above. Appropriate radiographs were obtained to verify the probe placement during the neurotomy.   Additional Comments:  The patient tolerated the procedure well Dressing: 2 x 2 sterile gauze and Band-Aid    Post-procedure details: Patient was observed during the procedure. Post-procedure instructions were reviewed.  Patient left the clinic in stable condition.      Clinical History: EXAM: MRI LUMBAR SPINE WITHOUT CONTRAST   TECHNIQUE: Multiplanar, multisequence MR imaging of the lumbar spine was performed. No intravenous contrast was administered.   COMPARISON:  11/02/2019   FINDINGS: Segmentation:  Normal   Alignment: Mild retrolisthesis L1-2, L2-3, L3-4, L4-5. Mild anterolisthesis L5-S1. levoscoliosis at L1-2.   Vertebrae:  Negative for fracture or mass.   Conus medullaris and cauda equina: Conus extends to the L1 level. Conus and cauda equina appear normal.   Paraspinal and other soft tissues: Negative for paraspinous mass or adenopathy.   Disc levels:   T11-12: Left-sided disc protrusion. Mild flattening of the left side of the cord. Mild left foraminal narrowing   T12-L1: Extensive disc degeneration. Small central disc protrusion. Extruded disc fragment in the midline behind the L1 vertebral body. No significant spinal stenosis. Moderate left foraminal stenosis.   L1-2: Central disc protrusion with upgoing extruded disc fragment in the midline. Disc degeneration with endplate spurring right greater than left. Moderate subarticular stenosis on the right. Mild spinal stenosis.   L2-3: Disc degeneration with disc space narrowing and spurring left greater than right. Moderate to severe subarticular stenosis on the left.  Mild spinal stenosis.   L3-4: Disc degeneration with spurring asymmetric on the left. Extraforaminal disc protrusion and spurring on the left. Moderate subarticular and foraminal stenosis on the left due to spurring. Mild facet degeneration   L4-5: Disc degeneration and spurring left greater than right. Moderate subarticular and foraminal stenosis on the left due to disc protrusion and spurring. Mild facet degeneration   L5-S1: Disc degeneration and disc bulging with small central disc protrusion. Mild facet degeneration. Moderate foraminal stenosis bilaterally due to spurring.   IMPRESSION: Lumbar levoscoliosis. Multilevel disc and facet degeneration throughout the lower thoracic and lumbar spine causing stenosis as above.   Negative for fracture or mass.     Electronically Signed   By: Franchot Gallo M.D.   On: 11/28/2019 17:58     Objective:  VS:  HT:    WT:   BMI:     BP:(!) 156/101  HR:66bpm  TEMP: ( )  RESP:  Physical Exam Vitals and nursing note reviewed.  Constitutional:      General: She is not in acute distress.    Appearance: Normal appearance. She is not ill-appearing.  HENT:     Head: Normocephalic and atraumatic.     Right Ear: External ear normal.     Left Ear: External ear normal.  Eyes:  Extraocular Movements: Extraocular movements intact.  Cardiovascular:     Rate and Rhythm: Normal rate.     Pulses: Normal pulses.  Pulmonary:     Effort: Pulmonary effort is normal. No respiratory distress.  Abdominal:     General: There is no distension.     Palpations: Abdomen is soft.  Musculoskeletal:        General: Tenderness present.     Cervical back: Neck supple.     Right lower leg: No edema.     Left lower leg: No edema.     Comments: Patient has good distal strength with no pain over the greater trochanters.  No clonus or focal weakness.  Skin:    Findings: No erythema, lesion or rash.  Neurological:     General: No focal deficit  present.     Mental Status: She is alert and oriented to person, place, and time.     Sensory: No sensory deficit.     Motor: No weakness or abnormal muscle tone.     Coordination: Coordination normal.  Psychiatric:        Mood and Affect: Mood normal.        Behavior: Behavior normal.      Imaging: No results found.

## 2022-12-27 ENCOUNTER — Other Ambulatory Visit: Payer: Self-pay | Admitting: Physician Assistant

## 2023-01-16 ENCOUNTER — Ambulatory Visit: Payer: 59 | Admitting: Internal Medicine

## 2023-01-28 ENCOUNTER — Ambulatory Visit: Payer: 59 | Admitting: Internal Medicine

## 2023-02-09 ENCOUNTER — Encounter: Payer: Self-pay | Admitting: Cardiovascular Disease

## 2023-02-09 ENCOUNTER — Ambulatory Visit: Payer: 59 | Attending: Internal Medicine | Admitting: Cardiovascular Disease

## 2023-02-09 VITALS — BP 122/80 | HR 83 | Ht 64.0 in | Wt 177.0 lb

## 2023-02-09 DIAGNOSIS — I10 Essential (primary) hypertension: Secondary | ICD-10-CM | POA: Diagnosis not present

## 2023-02-09 DIAGNOSIS — R9431 Abnormal electrocardiogram [ECG] [EKG]: Secondary | ICD-10-CM

## 2023-02-09 DIAGNOSIS — E782 Mixed hyperlipidemia: Secondary | ICD-10-CM

## 2023-02-09 DIAGNOSIS — E7800 Pure hypercholesterolemia, unspecified: Secondary | ICD-10-CM | POA: Diagnosis not present

## 2023-02-09 DIAGNOSIS — I700 Atherosclerosis of aorta: Secondary | ICD-10-CM

## 2023-02-09 NOTE — Patient Instructions (Signed)
Medication Instructions:  No changes *If you need a refill on your cardiac medications before your next appointment, please call your pharmacy*   Lab Work: [facility] and [facility]- today If you have labs (blood work) drawn today and your tests are completely normal, you will receive your results only by: [place] (if you have MyChart) OR A paper copy in the mail If you have any lab test that is abnormal or we need to change your treatment, we will call you to review the results.  Follow-Up: At [facility], you and your health needs are our priority.  As part of our continuing mission to provide you with exceptional heart care, we have created designated Provider Care Teams.  These Care Teams include your primary Cardiologist (physician) and Advanced Practice Providers (APPs -  Physician Assistants and Nurse Practitioners) who all work together to provide you with the care you need, when you need it.  We recommend signing up for the patient portal called "MyChart".  Sign up information is provided on this After Visit Summary.  MyChart is used to connect with patients for Virtual Visits (Telemedicine).  Patients are able to view lab/test results, encounter notes, upcoming appointments, etc.  Non-urgent messages can be sent to your provider as well.   To learn more about what you can do with MyChart, go to [email or web address].    Your next appointment:    Follow up as needed with Dr Sallyanne Kuster

## 2023-02-09 NOTE — Progress Notes (Unsigned)
Cardiology Office Note:    Date:  02/10/2023   ID:  [name], DOB [birth year], MRN [id number]  PCP:  Merryl Hacker, No   [facility] Providers Cardiologist:  None     Referring MD: Jorge Ny, PA-C   No chief complaint on file. [name] Hinton is a 62 y.o. female who is being seen today for the evaluation of hypertension at the request of Jorge Ny, PA-C.   History of Present Illness:    [name] Hinton is a 62 y.o. female with a hx of hypertension, hypercholesterolemia, restless leg syndrome, depression/bipolar affective disorder, who was initially referred for difficulty with blood pressure management.  In the interim, after some adjustments in antihypertensive medications, her blood pressure is now well-controlled on a combination of valsartan 3 and 20 mg daily and hydrochlorothiazide 50 mg daily.  Has no cardiovascular complaints. The patient specifically denies any chest pain at rest exertion, dyspnea at rest or with exertion, orthopnea, paroxysmal nocturnal dyspnea, syncope, palpitations, focal neurological deficits, intermittent claudication, lower extremity edema, unexplained weight gain, cough, hemoptysis or wheezing.  Of interest on her ECG today the QT interval is quite prolonged.  On today's tracing the QTc is 509 ms (otherwise normal tracing with sinus rhythm with single premature atrial contraction and possible left atrial abnormality).  There are no ischemic repolarization changes.  Reviewed multiple electrocardiograms going back to 2012.  The QTc is always at least borderline prolonged (usually around 470 ms).  She does not have a personal or family history of ventricular arrhythmia or unexplained sudden death.  She does not have diabetes mellitus.  On treatment with statin the most recent LDL available for review was 99.  There is mention of aortic atherosclerotic calcification and coronary artery calcification on his CT that was performed for other  reasons.  Labs checked today.  The results came back after she had gone home and the electrolytes including potassium, calcium, magnesium are all in normal range.  She is on several medications that could have mild-moderate impact on her QT interval, although some of these she is not currently taking such as Zofran.  Importantly, her mental health medications are not known to have major effect on QT interval (gabapentin, lamotrigine, bupropion).  She does take omeprazole, tizanidine, albeit not on a daily basis.  Past Medical History:  Diagnosis Date   Anxiety    Bipolar affective ([place])    Chronic headaches    Depression    Hypercholesteremia    Hypertension    Restless leg syndrome     Past Surgical History:  Procedure Laterality Date   ABDOMINAL HYSTERECTOMY     OOPHORECTOMY     TONSILLECTOMY      Current Medications: Current Meds  Medication Sig   ARTIFICIAL TEARS PF 0.1-0.3 % SOLN Place 1 drop into both eyes 3 (three) times daily as needed (for dryness).   aspirin EC 81 MG tablet Take 81 mg by mouth at bedtime. Swallow whole.   calcium carbonate (OS-CAL - DOSED IN MG OF ELEMENTAL CALCIUM) 1250 MG tablet Take 1 tablet by mouth daily.   gabapentin (NEURONTIN) 300 MG capsule Take 1 capsule (300 mg total) by mouth 3 (three) times daily.   hydrochlorothiazide (HYDRODIURIL) 50 MG tablet Take 1 tablet by mouth daily.   ibuprofen (ADVIL) 200 MG tablet Take 800 mg by mouth every 6 (six) hours as needed for headache or mild pain.   losartan-hydrochlorothiazide (HYZAAR) 50-12.5 MG tablet Take 1 tablet by mouth daily.  Multiple Vitamin (MULTIVITAMIN WITH MINERALS) TABS Take 1 tablet by mouth every morning.   naproxen sodium (ALEVE) 220 MG tablet Take 220-440 mg by mouth 2 (two) times daily as needed (for mild pain or headaches).   omeprazole (PRILOSEC OTC) 20 MG tablet Take 20 mg by mouth daily as needed (for reflux).   rOPINIRole (REQUIP) 0.25 MG tablet Take 0.25 mg by mouth at bedtime.    simvastatin (ZOCOR) 20 MG tablet Take 20 mg by mouth at bedtime.   valsartan (DIOVAN) 320 MG tablet Take 320 mg by mouth daily.   VRAYLAR 1.5 MG capsule Take 1.5 mg by mouth daily.   [DISCONTINUED] tiZANidine (ZANAFLEX) 4 MG tablet TAKE 1 TABLET (4 MG TOTAL) BY MOUTH EVERY 8 (EIGHT) HOURS AS NEEDED FOR MUSCLE SPASMS     Allergies:   Quetiapine and Vancomycin   Social History   Socioeconomic History   Marital status: Divorced    Spouse name: Not on file   Number of children: Not on file   Years of education: Not on file   Highest education level: Not on file  Occupational History   Not on file  Tobacco Use   Smoking status: Some Days    Types: Cigarettes   Smokeless tobacco: Never   Tobacco comments:    02/09/2023 A pack last patient about a week  Substance and Sexual Activity   Alcohol use: No   Drug use: No   Sexual activity: Yes    Birth control/protection: Surgical  Other Topics Concern   Not on file  Social History Narrative   Not on file   Social Determinants of Health   Financial Resource Strain: Not on file  Food Insecurity: Not on file  Transportation Needs: Not on file  Physical Activity: Not on file  Stress: Not on file  Social Connections: Not on file     Family History: The patient's family history is negative for Breast cancer.  ROS:   Please see the history of present illness.     All other systems reviewed and are negative.  EKGs/Labs/Other Studies Reviewed:    The following studies were reviewed today: Outside facility 07/28/2022 Cholesterol 165, triglycerides 88, HDL 56 LDL 93 thank you Creatinine 0.71, potassium 4.2, hemoglobin 12.6  EKG:  EKG is  ordered today.  The ekg ordered today demonstrates him with a single PAC, possible left atrial abnormality, markedly prolonged QTc at 509 ms.  Multiple ECGs from 2012, 2014, 2022 were reviewed.  All show at least a mild degree of QT prolongation.  Recent Labs: 02/09/2023: BUN 31; Creatinine,  Ser 1.07; Magnesium 2.2; Potassium 3.7; Sodium 143  Recent Lipid Panel    Component Value Date/Time   CHOL (H) 11/18/2007 0605    256        ATP III CLASSIFICATION:  <200     mg/dL   Desirable  200-239  mg/dL   Borderline High  >=240    mg/dL   High   TRIG 141 11/18/2007 0605   HDL 45 11/18/2007 0605   CHOLHDL 5.7 11/18/2007 0605   VLDL 28 11/18/2007 0605   LDLCALC (H) 11/18/2007 0605    183        Total Cholesterol/HDL:CHD Risk Coronary Heart Disease Risk Table                     Men   Women  1/2 Average Risk   3.4   3.3     Risk Assessment/Calculations:  Physical Exam:    VS:  BP 122/80 (BP Location: Left Arm, Patient Position: Sitting, Cuff Size: Normal)   Pulse 83   Ht '5\' 4"'$  (1.626 m)   Wt 177 lb (80.3 kg)   SpO2 96%   BMI 30.38 kg/m     Wt Readings from Last 3 Encounters:  02/09/23 177 lb (80.3 kg)  07/25/21 130 lb 1.1 oz (59 kg)  12/29/20 130 lb (59 kg)     GEN: Overweight, well nourished, well developed in no acute distress HEENT: Normal NECK: No JVD; No carotid bruits LYMPHATICS: No lymphadenopathy CARDIAC: RRR, no murmurs, rubs, gallops RESPIRATORY:  Clear to auscultation without rales, wheezing or rhonchi  ABDOMEN: Soft, non-tender, non-distended MUSCULOSKELETAL:  No edema; No deformity  SKIN: Warm and dry NEUROLOGIC:  Alert and oriented x 3 PSYCHIATRIC:  Normal affect   ASSESSMENT:    1. Long QT interval   2. Hypercholesterolemia   3. Atherosclerosis of aorta ([place])   4. Benign essential hypertension    PLAN:    In order of problems listed above:  Long QT interval: This appears to be a longstanding mild-moderate problem dating back at least to 2012.  She does not have a personal history of ventricular arrhythmia or syncope and there is no family history of serious arrhythmia or unexplained sudden death.  Would like to avoid medications that can prolong the QT interval.  For example would replace her proton pump inhibitor  with an H2 blocker such as famotidine or ranitidine.  Would try to avoid Zanaflex if possible.  Importantly, if there is a change in her mental health medications, would avoid those antipsychotics that have major QT prolonging effects.  Advised avoiding macrolide antibiotics and fluoroquinolone antibiotics, usually there are good alternatives for this. HLP: I do not have a recent lipid profile, but the most recent LDL cholesterol was in acceptable range at 93.  It looks like untreated LDL back in 2008 was 183 so this is a satisfactory 50% reduction from baseline.  She does not have symptomatic CAD or PAD.  I think it is unlikely that her QT prolongation is an expression of coronary ischemia. Aortic atherosclerosis/coronary calcification on CT: Reviewed the CT images.  The study is not gated so there is a lot of motion artifact at the level of the coronaries.  The aortic atherosclerosis is actually quite mild.  She does not have any symptoms to suggest coronary artery disease.  Her only risk factors are the treated hypercholesterolemia and treated hypertension, both currently in desirable ranges.  Regular exercise and efforts at weight loss would be advisable.  At this point, angiographic evaluation does not appear to be necessary, since is likely that treatment would be limited to risk factor modification anyway. HTN: now well controlled           Medication Adjustments/Labs and Tests Ordered: Current medicines are reviewed at length with the patient today.  Concerns regarding medicines are outlined above.  Orders Placed This Encounter  Procedures   Basic metabolic panel   Magnesium   EKG 12-Lead   No orders of the defined types were placed in this encounter.   Patient Instructions  Long QT Syndrome  Long QT syndrome (LQTS) is a heart condition in which the heart takes longer than normal to recharge after each heartbeat. This is caused by an abnormal electrical system in the heart. LQTS can  upset the timing of your heartbeats. It can cause dangerous changes in your heart rate and  rhythm (arrhythmia) that can lead to cardiac arrest. You can be born with LQTS, or you can develop it later in life. What are the causes? The cause of this condition depends on the type of LQTS that you have. Inherited LQTS. You are born with this condition. It is caused by an abnormal gene that is passed down through your family. Acquired LQTS. You get this condition later in life. It may be caused by: Certain medicines that can affect your heartbeat, like antibiotics and antidepressants. An electrolyte imbalance. Long periods of vomiting or diarrhea. A thyroid disorder. An eating disorder. What increases the risk? This condition is more likely to develop in: Women. People who have an eating disorder, such as anorexia nervosa or bulimia. People who have a family member with LQTS. People who have a family history of unexplained fainting, drowning, or sudden death. What are the signs or symptoms? Symptoms of this condition include: Fainting. A fluttering feeling in your chest. Seizures. Symptoms of inherited LQTS almost always start before age 28.  Some people with this condition have no symptoms. How is this diagnosed? This condition may be diagnosed based on: Your symptoms. Your medical history and family history. A physical exam. Some tests, including: An electrocardiogram (ECG) to measure electrical activity in your heart. Holter monitoring to record your heartbeat for 1-2 days. A stress test to record your heartbeat while you exercise. A blood test to look for genes that cause LQTS. How is this treated? There is no cure for this condition. Treatment depends on the cause, your symptoms, and whether you have a family history of sudden death. Treatment may include: Making lifestyle changes, such as avoiding competitive sports. Taking supplements to correct abnormal sodium, potassium,  calcium, and magnesium levels. Avoiding swimming and diving. Taking heart medicines, such as beta blockers. Having a device implanted that corrects a dangerous heartbeat called a cardioverter-defibrillator (ICD). This senses a fast heartbeat and shocks the heart to restore normal heart rate. Having heart surgery to prevent arrhythmias. Follow these instructions at home: Medicines Take over-the-counter and prescription medicines only as told by your health care provider. Before taking any new medicine, get approval from your health care provider first. Avoid any medicines that can cause this condition. Lifestyle Make lifestyle changes recommended by your health care provider. You may need to avoid: Stressful situations. Situations where sudden loud noises are likely. If you drink alcohol: Limit how much you have to: 0-1 drink a day for women who are not pregnant. 0-2 drinks a day for men. Know how much alcohol is in a drink. In the [place] one drink equals one 12 oz bottle of beer (355 mL), one 5 oz glass of wine (148 mL), or one 1 oz glass of hard liquor (44 mL). Do not use any products that contain nicotine or tobacco. These products include cigarettes, chewing tobacco, and vaping devices, such as e-cigarettes. If you need help quitting, ask your health care provider. General instructions Develop a plan with your health care provider for how to deal with a sudden arrhythmia. Tell people who live with you about the signs of a sudden arrhythmia. Wear a medical ID necklace or bracelet that states your diagnosis and contact information. Have an automated external defibrillator (AED) available at home or work. Get treatment and support if you feel stress, fear, anxiety, or depression. Keep all follow-up visits. This is important. Contact a health care provider if: You are suffering from stress, fear, anxiety, or depression. You have long  periods of vomiting or diarrhea. Get help right away  if: You have chest pain or difficulty breathing. You have a fluttering feeling in your chest. You faint. You have a seizure. These symptoms may be an emergency. Get help right away. Call 911. Do not wait to see if the symptoms will go away. Do not drive yourself to the hospital. Summary Long QT syndrome (LQTS) is a heart condition in which your heart takes longer than normal to recharge after each heartbeat. This is caused by an abnormal electrical system in your heart. LQTS can upset the timing of your heartbeats and cause dangerous changes in your heart rate and rhythm. Some people are born with LQTS. Others develop it later in life. Some people with this condition have no symptoms. Those who do have symptoms may experience fainting, a fluttering feeling in the chest, or seizures. There is no cure for this condition. Treatment depends on the cause, your symptoms, and whether you have a family history of sudden death. This information is not intended to replace advice given to you by your health care provider. Make sure you discuss any questions you have with your health care provider. Document Revised: 10/17/2021 Document Reviewed: 10/17/2021 Elsevier Patient Education  [place]. Medication Instructions:  No changes *If you need a refill on your cardiac medications before your next appointment, please call your pharmacy*   Lab Work: [facility] and [place]- today If you have labs (blood work) drawn today and your tests are completely normal, you will receive your results only by: [place] (if you have MyChart) OR A paper copy in the mail If you have any lab test that is abnormal or we need to change your treatment, we will call you to review the results.  Follow-Up: At [facility], you and your health needs are our priority.  As part of our continuing mission to provide you with exceptional heart care, we have created designated Provider Care Teams.  These Care Teams  include your primary Cardiologist (physician) and Advanced Practice Providers (APPs -  Physician Assistants and Nurse Practitioners) who all work together to provide you with the care you need, when you need it.  We recommend signing up for the patient portal called "MyChart".  Sign up information is provided on this After Visit Summary.  MyChart is used to connect with patients for Virtual Visits (Telemedicine).  Patients are able to view lab/test results, encounter notes, upcoming appointments, etc.  Non-urgent messages can be sent to your provider as well.   To learn more about what you can do with MyChart, go to [email or web address].    Your next appointment:    Follow up as needed with Dr Sallyanne Kuster     Signed, Sanda Klein, MD  02/10/2023 9:19 AM    [place]

## 2023-02-10 LAB — BASIC METABOLIC PANEL
BUN/Creatinine Ratio: 29 — ABNORMAL HIGH (ref 12–28)
BUN: 31 mg/dL — ABNORMAL HIGH (ref 8–27)
CO2: 25 mmol/L (ref 20–29)
Calcium: 10.1 mg/dL (ref 8.7–10.3)
Chloride: 101 mmol/L (ref 96–106)
Creatinine, Ser: 1.07 mg/dL — ABNORMAL HIGH (ref 0.57–1.00)
Glucose: 93 mg/dL (ref 70–99)
Potassium: 3.7 mmol/L (ref 3.5–5.2)
Sodium: 143 mmol/L (ref 134–144)
eGFR: 59 mL/min/{1.73_m2} — ABNORMAL LOW (ref >59)

## 2023-02-10 LAB — MAGNESIUM: Magnesium: 2.2 mg/dL (ref 1.6–2.3)

## 2023-02-10 NOTE — Progress Notes (Signed)
Sent patient a letter explaining lab results and information about long QT

## 2023-03-24 DIAGNOSIS — R9431 Abnormal electrocardiogram [ECG] [EKG]: Secondary | ICD-10-CM | POA: Insufficient documentation

## 2023-05-01 ENCOUNTER — Other Ambulatory Visit: Payer: Self-pay | Admitting: Physical Medicine and Rehabilitation

## 2023-05-12 ENCOUNTER — Other Ambulatory Visit: Payer: Self-pay | Admitting: Physician Assistant

## 2023-05-12 DIAGNOSIS — Z122 Encounter for screening for malignant neoplasm of respiratory organs: Secondary | ICD-10-CM

## 2023-05-18 ENCOUNTER — Other Ambulatory Visit: Payer: Self-pay | Admitting: Physician Assistant

## 2023-05-18 DIAGNOSIS — Z1231 Encounter for screening mammogram for malignant neoplasm of breast: Secondary | ICD-10-CM

## 2023-06-11 ENCOUNTER — Ambulatory Visit: Payer: 59

## 2023-07-08 ENCOUNTER — Ambulatory Visit: Payer: 59

## 2023-07-14 ENCOUNTER — Ambulatory Visit: Payer: 59

## 2023-07-24 ENCOUNTER — Ambulatory Visit: Payer: 59

## 2023-11-13 ENCOUNTER — Ambulatory Visit: Payer: Medicare Other

## 2024-02-18 ENCOUNTER — Ambulatory Visit: Payer: 59 | Admitting: Emergency Medicine

## 2024-03-02 ENCOUNTER — Encounter: Payer: Self-pay | Admitting: Cardiovascular Disease

## 2024-03-02 NOTE — Telephone Encounter (Signed)
 Error

## 2024-03-07 ENCOUNTER — Ambulatory Visit: Admitting: Physician Assistant

## 2024-03-07 ENCOUNTER — Encounter: Payer: Self-pay | Admitting: Physician Assistant

## 2024-03-07 ENCOUNTER — Ambulatory Visit: Attending: Cardiovascular Disease | Admitting: Cardiovascular Disease

## 2024-03-07 ENCOUNTER — Other Ambulatory Visit (INDEPENDENT_AMBULATORY_CARE_PROVIDER_SITE_OTHER): Payer: Self-pay

## 2024-03-07 ENCOUNTER — Encounter: Payer: Self-pay | Admitting: Cardiovascular Disease

## 2024-03-07 VITALS — BP 136/70 | HR 74 | Ht 64.0 in | Wt 121.4 lb

## 2024-03-07 DIAGNOSIS — G8929 Other chronic pain: Secondary | ICD-10-CM

## 2024-03-07 DIAGNOSIS — M545 Low back pain, unspecified: Secondary | ICD-10-CM | POA: Diagnosis not present

## 2024-03-07 DIAGNOSIS — R002 Palpitations: Secondary | ICD-10-CM | POA: Diagnosis not present

## 2024-03-07 DIAGNOSIS — I700 Atherosclerosis of aorta: Secondary | ICD-10-CM | POA: Diagnosis not present

## 2024-03-07 DIAGNOSIS — Z79899 Other long term (current) drug therapy: Secondary | ICD-10-CM

## 2024-03-07 DIAGNOSIS — I2510 Atherosclerotic heart disease of native coronary artery without angina pectoris: Secondary | ICD-10-CM | POA: Diagnosis not present

## 2024-03-07 DIAGNOSIS — I10 Essential (primary) hypertension: Secondary | ICD-10-CM | POA: Diagnosis not present

## 2024-03-07 DIAGNOSIS — E7800 Pure hypercholesterolemia, unspecified: Secondary | ICD-10-CM

## 2024-03-07 DIAGNOSIS — R9431 Abnormal electrocardiogram [ECG] [EKG]: Secondary | ICD-10-CM | POA: Diagnosis not present

## 2024-03-07 MED ORDER — METOPROLOL TARTRATE 50 MG PO TABS: 50.0000 mg | ORAL_TABLET | Freq: Two times a day (BID) | ORAL | 3 refills | Status: DC | PRN

## 2024-03-07 NOTE — Progress Notes (Unsigned)
 Cardiology Office Note:    Date:  03/08/2024   ID:  [name] Hinton, DOB 10-07-1961, MRN [id number]  PCP:  Pcp, No   [facility] Providers Cardiologist:  None     Referring MD: No ref. provider found   Chief Complaint  Patient presents with   Abnormal ECG    History of Present Illness:    [name] Hinton is a 63 y.o. female with a hx of hypertension, hypercholesterolemia, restless leg syndrome, depression/bipolar affective disorder, who was initially referred for difficulty with blood pressure management.    She is accompanied by her mother and they are both still mourning the passing of the patient's father about 3 months ago, after 64 years of marriage.  She is feeling well from a cardiovascular point of view.  The patient specifically denies any chest pain at rest or with exertion, dyspnea at rest or with exertion, orthopnea, paroxysmal nocturnal dyspnea, syncope,  focal neurological deficits, intermittent claudication, lower extremity edema, unexplained weight gain, cough, hemoptysis or wheezing.   She has infrequent palpitations associated with anxiety.  These have not caused dizziness or syncope or chest pain.  She took one of her mother's metoprolol with substantial relief.  Once again today her ECG shows QTc prolongation at about 488 ms.  It really has not otherwise changed from previous tracings.  At her last appointment the QTc was even longer at 590 ms (otherwise normal tracing with sinus rhythm with single premature atrial contraction and possible left atrial abnormality).  There are no ischemic repolarization changes.  Reviewed multiple electrocardiograms going back to 2012.  The QTc is always at least borderline prolonged (usually around 470 ms).  She does not have a personal or family history of ventricular arrhythmia or unexplained sudden death.  Her metabolic profile is okay with an LDL cholesterol of 99, rather low HDL of 39, normal creatinine 1.07, most  recent potassium level 3.7.  Calcium and magnesium are also normal range when last checked  She is on several medications that could have mild-moderate impact on her QT interval, although some of these she is not currently taking such as Zofran.  Importantly, her mental health medications are not known to have major effect on QT interval (gabapentin, lamotrigine, bupropion).  She does take omeprazole, tizanidine, albeit not on a daily basis.  Past Medical History:  Diagnosis Date   Anxiety    Bipolar affective (HCC)    Chronic headaches    Depression    Hypercholesteremia    Hypertension    Restless leg syndrome     Past Surgical History:  Procedure Laterality Date   ABDOMINAL HYSTERECTOMY     OOPHORECTOMY     TONSILLECTOMY      Current Medications: Current Meds  Medication Sig   ARTIFICIAL TEARS PF 0.1-0.3 % SOLN Place 1 drop into both eyes 3 (three) times daily as needed (for dryness).   aspirin EC 81 MG tablet Take 81 mg by mouth at bedtime. Swallow whole.   Aspirin-Acetaminophen-Caffeine (GOODY HEADACHE PO) Take 1 packet by mouth as needed (for headaches).   buPROPion (WELLBUTRIN XL) 150 MG 24 hr tablet Take 150 mg by mouth every morning.   Calcium Carb-Cholecalciferol 600-5 MG-MCG TABS Take 1 tablet by mouth daily.   gabapentin (NEURONTIN) 300 MG capsule TAKE 1 CAPSULE BY MOUTH THREE TIMES A DAY   hydrochlorothiazide (HYDRODIURIL) 50 MG tablet Take 1 tablet by mouth daily.   ibuprofen (ADVIL) 200 MG tablet Take 800 mg by mouth  every 6 (six) hours as needed for headache or mild pain.   metoprolol tartrate (LOPRESSOR) 50 MG tablet Take 1 tablet (50 mg total) by mouth 2 (two) times daily as needed (may take 50mg  1-2 times daily for irregular heart beat).   Multiple Vitamin (MULTIVITAMIN WITH MINERALS) TABS Take 1 tablet by mouth every morning.   naproxen sodium (ALEVE) 220 MG tablet Take 220-440 mg by mouth 2 (two) times daily as needed (for mild pain or headaches).   rOPINIRole  (REQUIP) 1 MG tablet Take 1 mg by mouth at bedtime.   simvastatin (ZOCOR) 20 MG tablet Take 20 mg by mouth at bedtime.   TYLENOL 8 HOUR 650 MG CR tablet Take 650-1,300 mg by mouth every 8 (eight) hours as needed for pain (or headaches).   valsartan (DIOVAN) 320 MG tablet Take 320 mg by mouth daily.   VRAYLAR 1.5 MG capsule Take 1.5 mg by mouth daily.   [DISCONTINUED] lamoTRIgine (LAMICTAL) 100 MG tablet Take 100 mg by mouth in the morning and at bedtime.     Allergies:   Quetiapine and Vancomycin   Social History   Socioeconomic History   Marital status: Divorced    Spouse name: Not on file   Number of children: Not on file   Years of education: Not on file   Highest education level: Not on file  Occupational History   Not on file  Tobacco Use   Smoking status: Some Days    Types: Cigarettes   Smokeless tobacco: Never   Tobacco comments:    03/07/2024 patient smokes about 4-5 cigarettes some days    02/09/2023 A pack last patient about a week  Substance and Sexual Activity   Alcohol use: No   Drug use: No   Sexual activity: Yes    Birth control/protection: Surgical  Other Topics Concern   Not on file  Social History Narrative   Not on file   Social Drivers of Health   Financial Resource Strain: Not on File (09/25/2022)   Received from [employer]    Financial Resource Strain: 0  Food Insecurity: Not at Risk (05/11/2023)   Received from [place], [facility] Insecurity    Food: 1  Transportation Needs: Not at Risk (05/11/2023)   Received from [place], [employer] Needs    Transportation: 1  Physical Activity: Not on File (03/20/2022)   Received from [place], [place]   Physical Activity    Physical Activity: 0  Stress: Not on File (03/20/2022)   Received from [facility], [place]   Stress    Stress: 0  Social Connections: Not on File (08/10/2023)   Received from [facility]   Social Connections    Connectedness: 0     Family History: The patient's  family history is negative for Breast cancer.  ROS:   Please see the history of present illness.     All other systems reviewed and are negative.  EKGs/Labs/Other Studies Reviewed:    The following studies were reviewed today: Outside facility 07/28/2022 Cholesterol 165, triglycerides 88, HDL 56 LDL 93  Creatinine 1.61, potassium 4.2, hemoglobin 12.6     Latest Ref Rng & Units 03/07/2024   12:31 PM 02/09/2023    3:42 PM 07/27/2021   12:06 AM  BMP  Glucose 70 - 99 mg/dL 64  93    BUN 8 - 27 mg/dL 17  31    Creatinine 0.96 - 1.00 mg/dL 0.45  4.09  8.11  BUN/Creat Ratio 12 - 28 19  29     Sodium 134 - 144 mmol/L 142  143    Potassium 3.5 - 5.2 mmol/L 3.4  3.7    Chloride 96 - 106 mmol/L 100  101    CO2 20 - 29 mmol/L 22  25    Calcium 8.7 - 10.3 mg/dL 9.9  16.1       EKG:    EKG Interpretation Date/Time:  Monday March 07 2024 11:55:18 EDT Ventricular Rate:  74 PR Interval:  178 QRS Duration:  68 QT Interval:  440 QTC Calculation: 488 R Axis:   1  Text Interpretation: Normal sinus rhythm Septal infarct , age undetermined When compared with ECG of 29-Dec-2020 16:50, No significant change since last tracing Confirmed by Orell Hurtado [phone]) on 03/07/2024 12:01:44 PM         Recent Labs: 03/07/2024: BUN 17; Creatinine, Ser 0.91; Potassium 3.4; Sodium 142  Recent Lipid Panel    Component Value Date/Time   CHOL 174 03/07/2024 1231   TRIG 62 03/07/2024 1231   HDL 62 03/07/2024 1231   CHOLHDL 2.8 03/07/2024 1231   CHOLHDL 5.7 11/18/2007 0605   VLDL 28 11/18/2007 0605   LDLCALC 100 (H) 03/07/2024 1231     Risk Assessment/Calculations:                Physical Exam:    VS:  BP 136/70 (BP Location: Left Arm, Patient Position: Sitting, Cuff Size: Normal)   Pulse 74   Ht 5\' 4"  (1.626 m)   Wt 121 lb 6.4 oz (55.1 kg)   SpO2 94%   BMI 20.84 kg/m     Wt Readings from Last 3 Encounters:  03/07/24 121 lb 6.4 oz (55.1 kg)  02/09/23 177 lb (80.3 kg)  07/25/21 130  lb 1.1 oz (59 kg)     General: Alert, oriented x3, no distress, Head: no evidence of trauma, PERRL, EOMI, no exophtalmos or lid lag, no myxedema, no xanthelasma; normal ears, nose and oropharynx Neck: normal jugular venous pulsations and no hepatojugular reflux; brisk carotid pulses without delay and no carotid bruits Chest: clear to auscultation, no signs of consolidation by percussion or palpation, normal fremitus, symmetrical and full respiratory excursions Cardiovascular: normal position and quality of the apical impulse, regular rhythm, normal first and second heart sounds, no murmurs, rubs or gallops Abdomen: no tenderness or distention, no masses by palpation, no abnormal pulsatility or arterial bruits, normal bowel sounds, no hepatosplenomegaly Extremities: no clubbing, cyanosis or edema; 2+ radial, ulnar and brachial pulses bilaterally; 2+ right femoral, posterior tibial and dorsalis pedis pulses; 2+ left femoral, posterior tibial and dorsalis pedis pulses; no subclavian or femoral bruits Neurological: grossly nonfocal Psych: Normal mood and affect   ASSESSMENT:    1. Benign essential hypertension   2. Hypercholesterolemia   3. Medication management   4. Palpitations   5. Atherosclerosis of aorta (HCC)   6. Coronary artery calcification seen on CT scan   7. Long QT interval    PLAN:    In order of problems listed above:  Long QT interval: This appears to be a longstanding mild-moderate problem dating back at least to 2012.  There is  no personal or family history of malignant ventricular arrhythmia or unexplained sudden death.  Would like to avoid medications that can prolong the QT interval and carefully maintain her electrolytes in normal range.    Importantly, if there is a change in her mental health medications, would avoid those  antipsychotics that have major QT prolonging effects.  Advised avoiding macrolide antibiotics and fluoroquinolone antibiotics, usually there are  good alternatives for this. Palpitations: Usually associated with an episode of anxiety.  She did have relief when she took her mother's metoprolol so I gave her a similar prescription.  The episodes are very infrequent and we would likely miss them with a wearable monitor.  Advised her to purchase a personal electronic monitoring device such as a smart watch or Kardia device.  Showed her how to send me tracings through MyChart. HLP: Labs checked today showed a slight increase in her LDL cholesterol which is now up to 100, the untreated level back in 2008 was 183.   Aortic atherosclerosis/coronary calcification on CT: She does not have known CAD or PVD but does have some evidence of atherosclerotic calcification in her aorta and her coronaries on previous CT.  The study is not gated so there is a lot of motion artifact at the level of the coronaries.  The aortic atherosclerosis is actually quite mild.  She does not have any symptoms to suggest coronary artery disease.  Her only risk factors are the treated hypercholesterolemia and treated hypertension, both currently in desirable ranges.  Regular exercise and efforts at weight loss would be advisable.  At this point, angiographic evaluation does not appear to be necessary, since is likely that treatment would be limited to risk factor modification anyway. HTN: Well-controlled.  Borderline low potassium, result which came back after she left the clinic.    A low potassium can further worsen her prolonged QTc interval.  Will recommend cutting the dose of hydrochlorothiazide and using spironolactone to help with both hypokalemia and maintain blood pressure in normal range.  She does not take the proton pump inhibitor on a daily basis, but I would recommend using an H2 blocker such as ranitidine or famotidine instead of it to reduce the likelihood of magnesium malabsorption.           Medication Adjustments/Labs and Tests Ordered: Current medicines are  reviewed at length with the patient today.  Concerns regarding medicines are outlined above.  Orders Placed This Encounter  Procedures   Lipid panel   Basic metabolic panel with GFR   EKG 95-MWUX   Meds ordered this encounter  Medications   metoprolol tartrate (LOPRESSOR) 50 MG tablet    Sig: Take 1 tablet (50 mg total) by mouth 2 (two) times daily as needed (may take 50mg  1-2 times daily for irregular heart beat).    Dispense:  90 tablet    Refill:  3    Patient Instructions  Medication Instructions:  Metoprolol 50 mg- take 1-2 times daily as needed for irregular heart beat *If you need a refill on your cardiac medications before your next appointment, please call your pharmacy*  Lab Work: Lipid panel and BMP If you have labs (blood work) drawn today and your tests are completely normal, you will receive your results only by: MyChart Message (if you have MyChart) OR A paper copy in the mail If you have any lab test that is abnormal or we need to change your treatment, we will call you to review the results.   You can look into the [facility] device by [employer]. This device is purchased by you and it connects to an application you download to your smart phone.  It can detect abnormal heart rhythms and alert you to contact your doctor for further evaluation. The web site is:  [email or web address].[email or web address].[email or web address]  Follow-Up: At [facility], you and your health needs are our priority.  As part of our continuing mission to provide you with exceptional heart care, our providers are all part of one team.  This team includes your primary Cardiologist (physician) and Advanced Practice Providers or APPs (Physician Assistants and Nurse Practitioners) who all work together to provide you with the care you need, when you need it.  Your next appointment:   1 year(s)  Provider:   Dr Royann Shivers  We recommend signing up for the patient portal called "MyChart".  Sign up information is  provided on this After Visit Summary.  MyChart is used to connect with patients for Virtual Visits (Telemedicine).  Patients are able to view lab/test results, encounter notes, upcoming appointments, etc.  Non-urgent messages can be sent to your provider as well.   To learn more about what you can do with MyChart, go to [email or web address].         1st Floor: - Lobby - Registration  - Pharmacy  - Lab - Cafe  2nd Floor: - PV Lab - Diagnostic Testing (echo, CT, nuclear med)  3rd Floor: - Vacant  4th Floor: - TCTS (cardiothoracic surgery) - AFib Clinic - Structural Heart Clinic - Vascular Surgery  - Vascular Ultrasound  5th Floor: - HeartCare Cardiology (general and EP) - Clinical Pharmacy for coumadin, hypertension, lipid, weight-loss medications, and med management appointments    Valet parking services will be available as well.      Signed, Thurmon Fair, MD  03/08/2024 3:10 PM    [facility]

## 2024-03-07 NOTE — Patient Instructions (Addendum)
 Medication Instructions:  Metoprolol 50 mg- take 1-2 times daily as needed for irregular heart beat *If you need a refill on your cardiac medications before your next appointment, please call your pharmacy*  Lab Work: Lipid panel and BMP If you have labs (blood work) drawn today and your tests are completely normal, you will receive your results only by: MyChart Message (if you have MyChart) OR A paper copy in the mail If you have any lab test that is abnormal or we need to change your treatment, we will call you to review the results.   You can look into the [facility] device by [employer]. This device is purchased by you and it connects to an application you download to your smart phone.  It can detect abnormal heart rhythms and alert you to contact your doctor for further evaluation. The web site is:  [email or web address]     Follow-Up: At [facility], you and your health needs are our priority.  As part of our continuing mission to provide you with exceptional heart care, our providers are all part of one team.  This team includes your primary Cardiologist (physician) and Advanced Practice Providers or APPs (Physician Assistants and Nurse Practitioners) who all work together to provide you with the care you need, when you need it.  Your next appointment:   1 year(s)  Provider:   Dr Royann Shivers  We recommend signing up for the patient portal called "MyChart".  Sign up information is provided on this After Visit Summary.  MyChart is used to connect with patients for Virtual Visits (Telemedicine).  Patients are able to view lab/test results, encounter notes, upcoming appointments, etc.  Non-urgent messages can be sent to your provider as well.   To learn more about what you can do with MyChart, go to [email or web address].         1st Floor: - Lobby - Registration  - Pharmacy  - Lab - Cafe  2nd Floor: - PV Lab - Diagnostic Testing (echo, CT, nuclear  med)  3rd Floor: - Vacant  4th Floor: - TCTS (cardiothoracic surgery) - AFib Clinic - Structural Heart Clinic - Vascular Surgery  - Vascular Ultrasound  5th Floor: - HeartCare Cardiology (general and EP) - Clinical Pharmacy for coumadin, hypertension, lipid, weight-loss medications, and med management appointments    Valet parking services will be available as well.

## 2024-03-07 NOTE — Progress Notes (Signed)
 Office Visit Note   Patient: [name] Hinton           Date of Birth: Mar 05, 1961           MRN: [id number] Visit Date: 03/07/2024              Requested by: No referring provider defined for this encounter. PCP: Pcp, No   Assessment & Plan: Visit Diagnoses:  1. Chronic bilateral low back pain without sciatica     Plan: We will refer back to Dr. Alvester Morin for possible radiofrequency ablation lumbar spine facet joints.  She will follow-up with [place] as needed.  Questions were encouraged and answered at length.  Follow-Up Instructions: Return if symptoms worsen or fail to improve.   Orders:  Orders Placed This Encounter  Procedures   XR Lumbar Spine 2-3 Views   No orders of the defined types were placed in this encounter.     Procedures: No procedures performed   Clinical Data: No additional findings.   Subjective: Chief Complaint  Patient presents with   Lower Back - Pain    HPI Mrs. [name] Hinton comes in today due to low back pain.  She states has been ongoing for the past 6 months.  He states that the symptoms are similar to what she was having prior to the radiofrequency ablation of the right L4-L5 and L5-S1 lumbar facet joints by Dr. Alvester Morin in 2023.  She denies any new injury.  Denies any numbness tingling or radicular symptoms down either leg.  No waking pain.  No bowel or bladder dysfunction.  No saddle anesthesia like symptoms.  Pains 10 out of 10 pain at worst.  Pain is worse will walking or prolonged walking.  Describes pain as achy tight stabbing pain at times.  Review of Systems  Constitutional:  Negative for chills and fever.  Musculoskeletal:  Positive for back pain.  Neurological:  Negative for numbness.     Objective: Vital Signs: There were no vitals taken for this visit.  Physical Exam Constitutional:      Appearance: She is normal weight. She is not ill-appearing or diaphoretic.  Cardiovascular:     Pulses: Normal pulses.  Pulmonary:     Effort:  Pulmonary effort is normal.  Neurological:     Mental Status: She is alert and oriented to person, place, and time.  Psychiatric:        Mood and Affect: Mood normal.     Ortho Exam Lower extremities: 5 out of 5 strength throughout lower extremities against resistance.  Negative straight leg raise bilaterally.  Decreased sensation over the great toes bilaterally superficial peroneal nerve distribution.  Dorsal pedal pulses are 2+ and equal symmetric.  Obvious scoliosis which can be seen through her shirt.  Specialty Comments:  EXAM: MRI LUMBAR SPINE WITHOUT CONTRAST   TECHNIQUE: Multiplanar, multisequence MR imaging of the lumbar spine was performed. No intravenous contrast was administered.   COMPARISON:  11/02/2019   FINDINGS: Segmentation:  Normal   Alignment: Mild retrolisthesis L1-2, L2-3, L3-4, L4-5. Mild anterolisthesis L5-S1. levoscoliosis at L1-2.   Vertebrae:  Negative for fracture or mass.   Conus medullaris and cauda equina: Conus extends to the L1 level. Conus and cauda equina appear normal.   Paraspinal and other soft tissues: Negative for paraspinous mass or adenopathy.   Disc levels:   T11-12: Left-sided disc protrusion. Mild flattening of the left side of the cord. Mild left foraminal narrowing   T12-L1: Extensive disc degeneration. Small central disc  protrusion. Extruded disc fragment in the midline behind the L1 vertebral body. No significant spinal stenosis. Moderate left foraminal stenosis.   L1-2: Central disc protrusion with upgoing extruded disc fragment in the midline. Disc degeneration with endplate spurring right greater than left. Moderate subarticular stenosis on the right. Mild spinal stenosis.   L2-3: Disc degeneration with disc space narrowing and spurring left greater than right. Moderate to severe subarticular stenosis on the left. Mild spinal stenosis.   L3-4: Disc degeneration with spurring asymmetric on the  left. Extraforaminal disc protrusion and spurring on the left. Moderate subarticular and foraminal stenosis on the left due to spurring. Mild facet degeneration   L4-5: Disc degeneration and spurring left greater than right. Moderate subarticular and foraminal stenosis on the left due to disc protrusion and spurring. Mild facet degeneration   L5-S1: Disc degeneration and disc bulging with small central disc protrusion. Mild facet degeneration. Moderate foraminal stenosis bilaterally due to spurring.   IMPRESSION: Lumbar levoscoliosis. Multilevel disc and facet degeneration throughout the lower thoracic and lumbar spine causing stenosis as above.   Negative for fracture or mass.     Electronically Signed   By: Marlan Palau M.D.   On: 11/28/2019 17:58  Imaging: XR Lumbar Spine 2-3 Views Result Date: 03/07/2024 Lumbar spine 2 views compared to films in 2022: No significant change.  Significant level of scoliosis.  Multilevel degenerative disc disease.  No acute fractures acute findings.    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   Low back pain 09/24/2011   Restless legs syndrome (RLS) 03/24/2011   Depressive disorder, not elsewhere classified 09/17/2010   Bipolar I disorder, single manic episode (HCC) 08/20/2010   Benign essential hypertension 05/14/2010   Hyperlipemia 05/14/2010   Osteopenia 12/03/2009   Obsessive-compulsive disorder 10/17/2009   Past Medical History:  Diagnosis Date   Anxiety    Bipolar affective (HCC)    Chronic headaches    Depression    Hypercholesteremia    Hypertension    Restless leg syndrome     Family History  Problem Relation Age of Onset   Breast cancer Neg Hx      Past Surgical History:  Procedure Laterality Date   ABDOMINAL HYSTERECTOMY     OOPHORECTOMY     TONSILLECTOMY     Social History   Occupational History   Not on file  Tobacco Use   Smoking status: Some Days    Types: Cigarettes   Smokeless tobacco: Never   Tobacco comments:    03/07/2024 patient smokes about 4-5 cigarettes some days    02/09/2023 A pack last patient about a week  Substance and Sexual Activity   Alcohol use: No   Drug use: No   Sexual activity: Yes    Birth control/protection: Surgical

## 2024-03-08 ENCOUNTER — Telehealth: Payer: Self-pay | Admitting: Emergency Medicine

## 2024-03-08 DIAGNOSIS — Z79899 Other long term (current) drug therapy: Secondary | ICD-10-CM

## 2024-03-08 DIAGNOSIS — R9431 Abnormal electrocardiogram [ECG] [EKG]: Secondary | ICD-10-CM

## 2024-03-08 LAB — LIPID PANEL
Chol/HDL Ratio: 2.8 ratio (ref 0.0–4.4)
Cholesterol, Total: 174 mg/dL (ref 100–199)
HDL: 62 mg/dL (ref >39)
LDL Chol Calc (NIH): 100 mg/dL — ABNORMAL HIGH (ref 0–99)
Triglycerides: 62 mg/dL (ref 0–149)
VLDL Cholesterol Cal: 12 mg/dL (ref 5–40)

## 2024-03-08 LAB — BASIC METABOLIC PANEL WITH GFR
BUN/Creatinine Ratio: 19 (ref 12–28)
BUN: 17 mg/dL (ref 8–27)
CO2: 22 mmol/L (ref 20–29)
Calcium: 9.9 mg/dL (ref 8.7–10.3)
Chloride: 100 mmol/L (ref 96–106)
Creatinine, Ser: 0.91 mg/dL (ref 0.57–1.00)
Glucose: 64 mg/dL — ABNORMAL LOW (ref 70–99)
Potassium: 3.4 mmol/L — ABNORMAL LOW (ref 3.5–5.2)
Sodium: 142 mmol/L (ref 134–144)
eGFR: 71 mL/min/{1.73_m2} (ref >59)

## 2024-03-08 MED ORDER — SPIRONOLACTONE-HCTZ 25-25 MG PO TABS: 1.0000 | ORAL_TABLET | Freq: Every day | ORAL | 11 refills | Status: DC

## 2024-03-08 NOTE — Telephone Encounter (Signed)
 Croitoru, Rachelle Hora, MD to Me      03/08/24  3:05 PM Result Note Actually, I came up with a different plan.  Since she has long QT interval I think it is very important to keep her potassium in normal range. Please stop the hydrochlorothiazide 50 mg daily Please start hydrochlorothiazide 25 mg/spironolactone 25 mg once daily (that is Aldactazide 25/25 once daily). Please recheck a basic metabolic panel and the magnesium level in 3-4 weeks.  Dr Royann Shivers called and spoke with the patient. He went over the information above.    RX sent to pt's pharmacy. Orders placed for lab work. Will remind patient to get labs

## 2024-03-22 ENCOUNTER — Telehealth: Payer: Self-pay | Admitting: Physical Medicine and Rehabilitation

## 2024-03-22 NOTE — Telephone Encounter (Signed)
 Patient called and wants to get on the schedule for the ablasion. CB#[phone]

## 2024-03-24 ENCOUNTER — Telehealth: Payer: Self-pay

## 2024-03-24 ENCOUNTER — Telehealth: Payer: Self-pay | Admitting: Physical Medicine and Rehabilitation

## 2024-03-24 NOTE — Telephone Encounter (Signed)
 [name] Hinton

## 2024-03-24 NOTE — Telephone Encounter (Signed)
 Pt called and states she think Dr Daisey Dryer office just called her. No chart notes from [facility]. Pt phone number is  [phone].

## 2024-03-28 ENCOUNTER — Telehealth: Payer: Self-pay | Admitting: Emergency Medicine

## 2024-03-28 DIAGNOSIS — R9431 Abnormal electrocardiogram [ECG] [EKG]: Secondary | ICD-10-CM

## 2024-03-28 DIAGNOSIS — Z79899 Other long term (current) drug therapy: Secondary | ICD-10-CM

## 2024-03-28 NOTE — Telephone Encounter (Signed)
 Pt ID [id number]- Informed that it is time to get repeat lab work- BMP and MAG. Instructed to come within a week to any [facility] or here at [address] on the first floor. Orders released.

## 2024-03-31 ENCOUNTER — Encounter: Payer: Self-pay | Admitting: Physical Medicine and Rehabilitation

## 2024-03-31 ENCOUNTER — Ambulatory Visit (INDEPENDENT_AMBULATORY_CARE_PROVIDER_SITE_OTHER): Admitting: Physical Medicine and Rehabilitation

## 2024-03-31 DIAGNOSIS — M47816 Spondylosis without myelopathy or radiculopathy, lumbar region: Secondary | ICD-10-CM | POA: Diagnosis not present

## 2024-03-31 DIAGNOSIS — G8929 Other chronic pain: Secondary | ICD-10-CM

## 2024-03-31 DIAGNOSIS — M545 Low back pain, unspecified: Secondary | ICD-10-CM | POA: Diagnosis not present

## 2024-03-31 NOTE — Progress Notes (Unsigned)
 Pain Scale   Average Pain 4 Patient advising she has lower back pain radiating to her left hip area when doing daily chores and she does get some relief when she uses heat and gabapentin  to help with discomfort.         +Driver, -BT, -Dye Allergies.

## 2024-03-31 NOTE — Progress Notes (Unsigned)
 fo  [name] Hinton - 63 y.o. female MRN [id number]  Date of birth: 1961-07-11  Office Visit Note: Visit Date: 03/31/2024 PCP: Pcp, No Referred by: No ref. provider found  Subjective: Chief Complaint  Patient presents with   Lower Back - Pain   HPI: [name] Hinton is a 63 y.o. female who comes in today for evaluation of chronic, worsening and severe bilateral lower back pain. Pain ongoing for several years, worsens with sitting, standing, walking and performing household tasks. She describes pain as sore, aching and burning sensation, currently rates as 8 out of 10. Some relief of pain with home exercise regimen, rest and use of medications. History of formal physical therapy, short term relief of pain with these treatments. Lumbar MRI imaging from 2020 exhibits levoscoliosis and multilevel facet arthropathy causing stenosis. No high grade spinal canal stenosis noted. She underwent bilateral L4-L5 and L5-S1 radiofrequency ablation in our office on 10/14/2024 with significant relief of pain, greater than 80% for over a year. She also reports increased functional ability post ablation. She is here today as her pain has gradually returned. She would like to repeat ablation procedure if warranted. Patient denies focal weakness, numbness and tingling. No recent trauma or falls.        Review of Systems  Musculoskeletal:  Positive for back pain.  Neurological:  Negative for tingling, sensory change, focal weakness and weakness.  All other systems reviewed and are negative.  Otherwise per HPI.  Assessment & Plan: Visit Diagnoses:    ICD-10-CM   1. Chronic bilateral low back pain without sciatica  M54.50 Ambulatory referral to Physical Medicine Rehab   G89.29     2. Spondylosis without myelopathy or radiculopathy, lumbar region  M47.816 Ambulatory referral to Physical Medicine Rehab    3. Facet arthropathy, lumbar  M47.816 Ambulatory referral to Physical Medicine Rehab       Plan:  Findings:  Chronic, worsening and severe axial back pain.  No radicular symptoms rating down the legs.  Patient continues to have severe pain despite good conservative therapy such as formal physical therapy, home exercise regimen, rest and use of medications.  Patient's clinical presentation and exam are consistent with facet mediated pain.  She does have pain with lumbar extension on exam today.  There is multilevel facet arthropathy noted.  We discussed treatment plan in detail today.  Next step is to perform bilateral L4-L5 and L5-S1 radiofrequency ablation under fluoroscopic guidance.  She has no questions regarding ablation procedure at this time.  We also discussed obtaining new lumbar MRI imaging at some point as this is really best practice.  No red flag symptoms noted upon exam today.    Meds & Orders: No orders of the defined types were placed in this encounter.   Orders Placed This Encounter  Procedures   Ambulatory referral to Physical Medicine Rehab    Follow-up: Return for Bilateral L4-L5 and L5-S1 radiofrequency ablation.   Procedures: No procedures performed      Clinical History: EXAM: MRI LUMBAR SPINE WITHOUT CONTRAST   TECHNIQUE: Multiplanar, multisequence MR imaging of the lumbar spine was performed. No intravenous contrast was administered.   COMPARISON:  11/02/2019   FINDINGS: Segmentation:  Normal   Alignment: Mild retrolisthesis L1-2, L2-3, L3-4, L4-5. Mild anterolisthesis L5-S1. levoscoliosis at L1-2.   Vertebrae:  Negative for fracture or mass.   Conus medullaris and cauda equina: Conus extends to the L1 level. Conus and cauda equina appear normal.   Paraspinal and other  soft tissues: Negative for paraspinous mass or adenopathy.   Disc levels:   T11-12: Left-sided disc protrusion. Mild flattening of the left side of the cord. Mild left foraminal narrowing   T12-L1: Extensive disc degeneration. Small central disc protrusion. Extruded disc  fragment in the midline behind the L1 vertebral body. No significant spinal stenosis. Moderate left foraminal stenosis.   L1-2: Central disc protrusion with upgoing extruded disc fragment in the midline. Disc degeneration with endplate spurring right greater than left. Moderate subarticular stenosis on the right. Mild spinal stenosis.   L2-3: Disc degeneration with disc space narrowing and spurring left greater than right. Moderate to severe subarticular stenosis on the left. Mild spinal stenosis.   L3-4: Disc degeneration with spurring asymmetric on the left. Extraforaminal disc protrusion and spurring on the left. Moderate subarticular and foraminal stenosis on the left due to spurring. Mild facet degeneration   L4-5: Disc degeneration and spurring left greater than right. Moderate subarticular and foraminal stenosis on the left due to disc protrusion and spurring. Mild facet degeneration   L5-S1: Disc degeneration and disc bulging with small central disc protrusion. Mild facet degeneration. Moderate foraminal stenosis bilaterally due to spurring.   IMPRESSION: Lumbar levoscoliosis. Multilevel disc and facet degeneration throughout the lower thoracic and lumbar spine causing stenosis as above.   Negative for fracture or mass.     Electronically Signed   By: Anastasio Balsam M.D.   On: 11/28/2019 17:58   She reports that she has been smoking cigarettes. She has never used smokeless tobacco. No results for input(s): "HGBA1C", "LABURIC" in the last 8760 hours.  Objective:  VS:  HT:    WT:   BMI:     BP:   HR: bpm  TEMP: ( )  RESP:  Physical Exam Vitals and nursing note reviewed.  HENT:     Head: Normocephalic and atraumatic.     Right Ear: External ear normal.     Left Ear: External ear normal.     Nose: Nose normal.     Mouth/Throat:     Mouth: Mucous membranes are moist.  Eyes:     Extraocular Movements: Extraocular movements intact.  Cardiovascular:     Rate  and Rhythm: Normal rate.     Pulses: Normal pulses.  Pulmonary:     Effort: Pulmonary effort is normal.  Abdominal:     General: Abdomen is flat. There is no distension.  Musculoskeletal:        General: Tenderness present.     Cervical back: Normal range of motion.     Comments: Patient rises from seated position to standing without difficulty. Pain noted with facet loading and lumbar extension. 5/5 strength noted with bilateral hip flexion, knee flexion/extension, ankle dorsiflexion/plantarflexion and EHL. No clonus noted bilaterally. No pain upon palpation of greater trochanters. No pain with internal/external rotation of bilateral hips. Sensation intact bilaterally. Negative slump test bilaterally. Ambulates without aid, gait steady.     Skin:    General: Skin is warm and dry.     Capillary Refill: Capillary refill takes less than 2 seconds.  Neurological:     General: No focal deficit present.     Mental Status: She is alert and oriented to person, place, and time.  Psychiatric:        Mood and Affect: Mood normal.        Behavior: Behavior normal.     Ortho Exam  Imaging: No results found.  Past Medical/Family/Surgical/Social History: Medications & Allergies  reviewed per EMR, new medications updated. Patient Active Problem List   Diagnosis Date Noted   Medication management 10/02/2021   Bipolar I disorder, most recent episode depressed (HCC)    Cellulitis of left breast 07/25/2021   Depression with anxiety 07/25/2021   Abscess of left breast 07/25/2021   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   Low back pain 09/24/2011   Restless legs syndrome (RLS) 03/24/2011   Depressive disorder, not elsewhere classified 09/17/2010   Bipolar I disorder, single manic episode (HCC) 08/20/2010   Benign essential hypertension 05/14/2010   Hyperlipemia 05/14/2010   Osteopenia 12/03/2009   Obsessive-compulsive disorder 10/17/2009   Past Medical History:  Diagnosis Date   Anxiety     Bipolar affective (HCC)    Chronic headaches    Depression    Hypercholesteremia    Hypertension    Restless leg syndrome    Family History  Problem Relation Age of Onset   Breast cancer Neg Hx    Past Surgical History:  Procedure Laterality Date   ABDOMINAL HYSTERECTOMY     OOPHORECTOMY     TONSILLECTOMY     Social History   Occupational History   Not on file  Tobacco Use   Smoking status: Some Days    Types: Cigarettes   Smokeless tobacco: Never   Tobacco comments:    03/07/2024 patient smokes about 4-5 cigarettes some days    02/09/2023 A pack last patient about a week  Substance and Sexual Activity   Alcohol use: No   Drug use: No   Sexual activity: Yes    Birth control/protection: Surgical

## 2024-03-31 NOTE — Progress Notes (Unsigned)
 Core Outcome Measures Index (COMI) Back Score  Average Pain 5  COMI Score 50 %

## 2024-04-27 ENCOUNTER — Ambulatory Visit (INDEPENDENT_AMBULATORY_CARE_PROVIDER_SITE_OTHER): Admitting: Physical Medicine and Rehabilitation

## 2024-04-27 ENCOUNTER — Other Ambulatory Visit: Payer: Self-pay

## 2024-04-27 VITALS — BP 149/94 | HR 77

## 2024-04-27 DIAGNOSIS — M47816 Spondylosis without myelopathy or radiculopathy, lumbar region: Secondary | ICD-10-CM | POA: Diagnosis not present

## 2024-04-27 MED ORDER — METHYLPREDNISOLONE ACETATE 40 MG/ML IJ SUSP
40.0000 mg | Freq: Once | INTRAMUSCULAR | Status: AC
Start: 2024-04-27 — End: 2024-04-27
  Administered 2024-04-27: 40 mg

## 2024-04-27 NOTE — Progress Notes (Signed)
 Pain Scale   Average Pain 6 Patient advising her back pain increases with motion and daily duties. Patient advising the Last RFA helped manage her pain         +Driver, -BT, -Dye Allergies.

## 2024-04-27 NOTE — Patient Instructions (Signed)

## 2024-04-30 NOTE — Progress Notes (Signed)
 [name] Hinton - 63 y.o. female MRN [id number]  Date of birth: 06-Mar-1961  Office Visit Note: Visit Date: 04/27/2024 PCP: Pcp, No Referred by: No ref. provider found  Subjective: Chief Complaint  Patient presents with   Lower Back - Pain   HPI:  [name] Hinton is a 63 y.o. female who comes in todayfor planned repeat radiofrequency ablation of the Left L4-5 and L5-S1  Lumbar facet joints. This would be ablation of the corresponding medial branches and/or dorsal rami.  Patient has had double diagnostic blocks with more than 70% relief.  Subsequent ablation gave them more than 6 months of over 60% relief.  They have had chronic back pain for quite some time, more than 3 months, which has been an ongoing situation with recalcitrant axial back pain.  They have no radicular pain.  Their axial pain is worse with standing and ambulating and on exam today with facet loading.  They have had physical therapy as well as home exercise program.  The imaging noted in the chart below indicated facet pathology. Accordingly they meet all the criteria and qualification for for radiofrequency ablation and we are going to complete this today hopefully for more longer term relief as part of comprehensive management program.   ROS Otherwise per HPI.  Assessment & Plan: Visit Diagnoses:    ICD-10-CM   1. Spondylosis without myelopathy or radiculopathy, lumbar region  M47.816 XR C-ARM NO REPORT    Radiofrequency,Lumbar    methylPREDNISolone  acetate (DEPO-MEDROL ) injection 40 mg      Plan: No additional findings.   Meds & Orders:  Meds ordered this encounter  Medications   methylPREDNISolone  acetate (DEPO-MEDROL ) injection 40 mg    Orders Placed This Encounter  Procedures   Radiofrequency,Lumbar   XR C-ARM NO REPORT    Follow-up: Return if symptoms worsen or fail to improve.   Procedures: No procedures performed  Lumbar Facet Joint Nerve Denervation  Patient: [name] Hinton      Date of Birth:  1961/07/21 MRN: [id number] PCP: Pcp, No      Visit Date: 04/27/2024   Universal Protocol:    Date/Time: 05/31/258:58 AM  Consent Given By: the patient  Position: PRONE  Additional Comments: Vital signs were monitored before and after the procedure. Patient was prepped and draped in the usual sterile fashion. The correct patient, procedure, and site was verified.   Injection Procedure Details:   Procedure diagnoses:  1. Spondylosis without myelopathy or radiculopathy, lumbar region      Meds Administered:  Meds ordered this encounter  Medications   methylPREDNISolone  acetate (DEPO-MEDROL ) injection 40 mg     Laterality: Left  Location/Site:  L4-L5, L3 and L4 medial branches and L5-S1, L4 medial branch and L5 dorsal ramus  Needle: 18 ga.,  10mm active tip, RF Cannula  Needle Placement: Along juncture of superior articular process and transverse pocess  Findings:  -Comments:  Procedure Details: For each desired target nerve, the corresponding transverse process (sacral ala for the L5 dorsal rami) was identified and the fluoroscope was positioned to square off the endplates of the corresponding vertebral body to achieve a true AP midline view.  The beam was then obliqued 15 to 20 degrees and caudally tilted 15 to 20 degrees to line up a trajectory along the target nerves. The skin over the target of the junction of superior articulating process and transverse process (sacral ala for the L5 dorsal rami) was infiltrated with 1ml of 1% Lidocaine  without Epinephrine .  The 18 gauge 10mm active tip outer cannula was advanced in trajectory view to the target.  This procedure was repeated for each target nerve.  Then, for all levels, the outer cannula placement was fine-tuned and the position was then confirmed with bi-planar imaging.    Test stimulation was done both at sensory and motor levels to ensure there was no radicular stimulation. The target tissues were then  infiltrated with 1 ml of 1% Lidocaine  without Epinephrine . Subsequently, a percutaneous neurotomy was carried out for 90 seconds at 80 degrees Celsius.  After the completion of the lesion, 1 ml of injectate was delivered. It was then repeated for each facet joint nerve mentioned above. Appropriate radiographs were obtained to verify the probe placement during the neurotomy.   Additional Comments:  The patient tolerated the procedure well Dressing: 2 x 2 sterile gauze and Band-Aid    Post-procedure details: Patient was observed during the procedure. Post-procedure instructions were reviewed.  Patient left the clinic in stable condition.      Clinical History: EXAM: MRI LUMBAR SPINE WITHOUT CONTRAST   TECHNIQUE: Multiplanar, multisequence MR imaging of the lumbar spine was performed. No intravenous contrast was administered.   COMPARISON:  11/02/2019   FINDINGS: Segmentation:  Normal   Alignment: Mild retrolisthesis L1-2, L2-3, L3-4, L4-5. Mild anterolisthesis L5-S1. levoscoliosis at L1-2.   Vertebrae:  Negative for fracture or mass.   Conus medullaris and cauda equina: Conus extends to the L1 level. Conus and cauda equina appear normal.   Paraspinal and other soft tissues: Negative for paraspinous mass or adenopathy.   Disc levels:   T11-12: Left-sided disc protrusion. Mild flattening of the left side of the cord. Mild left foraminal narrowing   T12-L1: Extensive disc degeneration. Small central disc protrusion. Extruded disc fragment in the midline behind the L1 vertebral body. No significant spinal stenosis. Moderate left foraminal stenosis.   L1-2: Central disc protrusion with upgoing extruded disc fragment in the midline. Disc degeneration with endplate spurring right greater than left. Moderate subarticular stenosis on the right. Mild spinal stenosis.   L2-3: Disc degeneration with disc space narrowing and spurring left greater than right. Moderate to  severe subarticular stenosis on the left. Mild spinal stenosis.   L3-4: Disc degeneration with spurring asymmetric on the left. Extraforaminal disc protrusion and spurring on the left. Moderate subarticular and foraminal stenosis on the left due to spurring. Mild facet degeneration   L4-5: Disc degeneration and spurring left greater than right. Moderate subarticular and foraminal stenosis on the left due to disc protrusion and spurring. Mild facet degeneration   L5-S1: Disc degeneration and disc bulging with small central disc protrusion. Mild facet degeneration. Moderate foraminal stenosis bilaterally due to spurring.   IMPRESSION: Lumbar levoscoliosis. Multilevel disc and facet degeneration throughout the lower thoracic and lumbar spine causing stenosis as above.   Negative for fracture or mass.     Electronically Signed   By: Anastasio Balsam M.D.   On: 11/28/2019 17:58     Objective:  VS:  HT:    WT:   BMI:     BP:(!) 149/94  HR:77bpm  TEMP: ( )  RESP:  Physical Exam Vitals and nursing note reviewed.  Constitutional:      General: She is not in acute distress.    Appearance: Normal appearance. She is not ill-appearing.  HENT:     Head: Normocephalic and atraumatic.     Right Ear: External ear normal.     Left Ear: External ear  normal.  Eyes:     Extraocular Movements: Extraocular movements intact.  Cardiovascular:     Rate and Rhythm: Normal rate.     Pulses: Normal pulses.  Pulmonary:     Effort: Pulmonary effort is normal. No respiratory distress.  Abdominal:     General: There is no distension.     Palpations: Abdomen is soft.  Musculoskeletal:        General: Tenderness present.     Cervical back: Neck supple.     Right lower leg: No edema.     Left lower leg: No edema.     Comments: Patient has good distal strength with no pain over the greater trochanters.  No clonus or focal weakness.  Skin:    Findings: No erythema, lesion or rash.   Neurological:     General: No focal deficit present.     Mental Status: She is alert and oriented to person, place, and time.     Sensory: No sensory deficit.     Motor: No weakness or abnormal muscle tone.     Coordination: Coordination normal.  Psychiatric:        Mood and Affect: Mood normal.        Behavior: Behavior normal.      Imaging: No results found.

## 2024-04-30 NOTE — Procedures (Signed)
 Lumbar Facet Joint Nerve Denervation  Patient: [name] Hinton      Date of Birth: Dec 04, 1960 MRN: [id number] PCP: Pcp, No      Visit Date: 04/27/2024   Universal Protocol:    Date/Time: 05/31/258:58 AM  Consent Given By: the patient  Position: PRONE  Additional Comments: Vital signs were monitored before and after the procedure. Patient was prepped and draped in the usual sterile fashion. The correct patient, procedure, and site was verified.   Injection Procedure Details:   Procedure diagnoses:  1. Spondylosis without myelopathy or radiculopathy, lumbar region      Meds Administered:  Meds ordered this encounter  Medications   methylPREDNISolone  acetate (DEPO-MEDROL ) injection 40 mg     Laterality: Left  Location/Site:  L4-L5, L3 and L4 medial branches and L5-S1, L4 medial branch and L5 dorsal ramus  Needle: 18 ga.,  10mm active tip, RF Cannula  Needle Placement: Along juncture of superior articular process and transverse pocess  Findings:  -Comments:  Procedure Details: For each desired target nerve, the corresponding transverse process (sacral ala for the L5 dorsal rami) was identified and the fluoroscope was positioned to square off the endplates of the corresponding vertebral body to achieve a true AP midline view.  The beam was then obliqued 15 to 20 degrees and caudally tilted 15 to 20 degrees to line up a trajectory along the target nerves. The skin over the target of the junction of superior articulating process and transverse process (sacral ala for the L5 dorsal rami) was infiltrated with 1ml of 1% Lidocaine  without Epinephrine .  The 18 gauge 10mm active tip outer cannula was advanced in trajectory view to the target.  This procedure was repeated for each target nerve.  Then, for all levels, the outer cannula placement was fine-tuned and the position was then confirmed with bi-planar imaging.    Test stimulation was done both at sensory and motor  levels to ensure there was no radicular stimulation. The target tissues were then infiltrated with 1 ml of 1% Lidocaine  without Epinephrine . Subsequently, a percutaneous neurotomy was carried out for 90 seconds at 80 degrees Celsius.  After the completion of the lesion, 1 ml of injectate was delivered. It was then repeated for each facet joint nerve mentioned above. Appropriate radiographs were obtained to verify the probe placement during the neurotomy.   Additional Comments:  The patient tolerated the procedure well Dressing: 2 x 2 sterile gauze and Band-Aid    Post-procedure details: Patient was observed during the procedure. Post-procedure instructions were reviewed.  Patient left the clinic in stable condition.

## 2024-05-09 ENCOUNTER — Encounter: Admitting: Physical Medicine and Rehabilitation

## 2024-05-09 ENCOUNTER — Telehealth: Payer: Self-pay | Admitting: Physical Medicine and Rehabilitation

## 2024-05-09 NOTE — Telephone Encounter (Signed)
 Patient needs to [facility] her appointment for today.

## 2024-06-02 ENCOUNTER — Ambulatory Visit: Admitting: Physical Medicine and Rehabilitation

## 2024-06-02 ENCOUNTER — Other Ambulatory Visit: Payer: Self-pay

## 2024-06-02 VITALS — BP 147/89 | HR 65

## 2024-06-02 DIAGNOSIS — M47816 Spondylosis without myelopathy or radiculopathy, lumbar region: Secondary | ICD-10-CM | POA: Diagnosis not present

## 2024-06-02 MED ORDER — METHYLPREDNISOLONE ACETATE 40 MG/ML IJ SUSP
40.0000 mg | Freq: Once | INTRAMUSCULAR | Status: AC
  Administered 2024-06-02: 40 mg

## 2024-06-02 NOTE — Procedures (Signed)
 Lumbar Facet Joint Nerve Denervation  Patient: [name] Hinton      Date of Birth: 11/21/1961 MRN: [id number] PCP: Pcp, No      Visit Date: 06/02/2024   Universal Protocol:    Date/Time: 07/03/251:04 PM  Consent Given By: the patient  Position: PRONE  Additional Comments: Vital signs were monitored before and after the procedure. Patient was prepped and draped in the usual sterile fashion. The correct patient, procedure, and site was verified.   Injection Procedure Details:   Procedure diagnoses:  1. Spondylosis without myelopathy or radiculopathy, lumbar region      Meds Administered:  Meds ordered this encounter  Medications   methylPREDNISolone  acetate (DEPO-MEDROL ) injection 40 mg     Laterality: Right  Location/Site:  L4-L5, L3 and L4 medial branches and L5-S1, L4 medial branch and L5 dorsal ramus  Needle: 18 ga.,  10mm active tip, RF Cannula  Needle Placement: Along juncture of superior articular process and transverse pocess  Findings:  -Comments:  Procedure Details: For each desired target nerve, the corresponding transverse process (sacral ala for the L5 dorsal rami) was identified and the fluoroscope was positioned to square off the endplates of the corresponding vertebral body to achieve a true AP midline view.  The beam was then obliqued 15 to 20 degrees and caudally tilted 15 to 20 degrees to line up a trajectory along the target nerves. The skin over the target of the junction of superior articulating process and transverse process (sacral ala for the L5 dorsal rami) was infiltrated with 1ml of 1% Lidocaine  without Epinephrine .  The 18 gauge 10mm active tip outer cannula was advanced in trajectory view to the target.  This procedure was repeated for each target nerve.  Then, for all levels, the outer cannula placement was fine-tuned and the position was then confirmed with bi-planar imaging.    Test stimulation was done both at sensory and motor  levels to ensure there was no radicular stimulation. The target tissues were then infiltrated with 1 ml of 1% Lidocaine  without Epinephrine . Subsequently, a percutaneous neurotomy was carried out for 90 seconds at 80 degrees Celsius.  After the completion of the lesion, 1 ml of injectate was delivered. It was then repeated for each facet joint nerve mentioned above. Appropriate radiographs were obtained to verify the probe placement during the neurotomy.   Additional Comments:  The patient tolerated the procedure well Dressing: 2 x 2 sterile gauze and Band-Aid    Post-procedure details: Patient was observed during the procedure. Post-procedure instructions were reviewed.  Patient left the clinic in stable condition.

## 2024-06-02 NOTE — Progress Notes (Signed)
 [name] Hinton - 63 y.o. female MRN [id number]  Date of birth: 1960-12-19  Office Visit Note: Visit Date: 06/02/2024 PCP: Pcp, No Referred by: Trudy Duwaine BRAVO, NP  Subjective: Chief Complaint  Patient presents with   Lower Back - Pain   HPI:  [name] Hinton is a 63 y.o. female who comes in todayfor planned repeat radiofrequency ablation of the Right L4-5 and L5-S1  Lumbar facet joints. This would be ablation of the corresponding medial branches and/or dorsal rami.  Patient has had double diagnostic blocks with more than 70% relief.  Subsequent ablation gave them more than 6 months of over 60% relief.  They have had chronic back pain for quite some time, more than 3 months, which has been an ongoing situation with recalcitrant axial back pain.  They have no radicular pain.  Their axial pain is worse with standing and ambulating and on exam today with facet loading.  They have had physical therapy as well as home exercise program.  The imaging noted in the chart below indicated facet pathology. Accordingly they meet all the criteria and qualification for for radiofrequency ablation and we are going to complete this today hopefully for more longer term relief as part of comprehensive management program.   ROS Otherwise per HPI.  Assessment & Plan: Visit Diagnoses:    ICD-10-CM   1. Spondylosis without myelopathy or radiculopathy, lumbar region  M47.816 XR C-ARM NO REPORT    Radiofrequency,Lumbar    methylPREDNISolone  acetate (DEPO-MEDROL ) injection 40 mg      Plan: No additional findings.   Meds & Orders:  Meds ordered this encounter  Medications   methylPREDNISolone  acetate (DEPO-MEDROL ) injection 40 mg    Orders Placed This Encounter  Procedures   Radiofrequency,Lumbar   XR C-ARM NO REPORT    Follow-up: Return if symptoms worsen or fail to improve.   Procedures: No procedures performed  Lumbar Facet Joint Nerve Denervation  Patient: [name] Hinton      Date of Birth:  14-Aug-1961 MRN: [id number] PCP: Pcp, No      Visit Date: 06/02/2024   Universal Protocol:    Date/Time: 07/03/251:04 PM  Consent Given By: the patient  Position: PRONE  Additional Comments: Vital signs were monitored before and after the procedure. Patient was prepped and draped in the usual sterile fashion. The correct patient, procedure, and site was verified.   Injection Procedure Details:   Procedure diagnoses:  1. Spondylosis without myelopathy or radiculopathy, lumbar region      Meds Administered:  Meds ordered this encounter  Medications   methylPREDNISolone  acetate (DEPO-MEDROL ) injection 40 mg     Laterality: Right  Location/Site:  L4-L5, L3 and L4 medial branches and L5-S1, L4 medial branch and L5 dorsal ramus  Needle: 18 ga.,  10mm active tip, RF Cannula  Needle Placement: Along juncture of superior articular process and transverse pocess  Findings:  -Comments:  Procedure Details: For each desired target nerve, the corresponding transverse process (sacral ala for the L5 dorsal rami) was identified and the fluoroscope was positioned to square off the endplates of the corresponding vertebral body to achieve a true AP midline view.  The beam was then obliqued 15 to 20 degrees and caudally tilted 15 to 20 degrees to line up a trajectory along the target nerves. The skin over the target of the junction of superior articulating process and transverse process (sacral ala for the L5 dorsal rami) was infiltrated with 1ml of 1% Lidocaine  without Epinephrine .  The 18 gauge 10mm active tip outer cannula was advanced in trajectory view to the target.  This procedure was repeated for each target nerve.  Then, for all levels, the outer cannula placement was fine-tuned and the position was then confirmed with bi-planar imaging.    Test stimulation was done both at sensory and motor levels to ensure there was no radicular stimulation. The target tissues were then  infiltrated with 1 ml of 1% Lidocaine  without Epinephrine . Subsequently, a percutaneous neurotomy was carried out for 90 seconds at 80 degrees Celsius.  After the completion of the lesion, 1 ml of injectate was delivered. It was then repeated for each facet joint nerve mentioned above. Appropriate radiographs were obtained to verify the probe placement during the neurotomy.   Additional Comments:  The patient tolerated the procedure well Dressing: 2 x 2 sterile gauze and Band-Aid    Post-procedure details: Patient was observed during the procedure. Post-procedure instructions were reviewed.  Patient left the clinic in stable condition.      Clinical History: EXAM: MRI LUMBAR SPINE WITHOUT CONTRAST   TECHNIQUE: Multiplanar, multisequence MR imaging of the lumbar spine was performed. No intravenous contrast was administered.   COMPARISON:  11/02/2019   FINDINGS: Segmentation:  Normal   Alignment: Mild retrolisthesis L1-2, L2-3, L3-4, L4-5. Mild anterolisthesis L5-S1. levoscoliosis at L1-2.   Vertebrae:  Negative for fracture or mass.   Conus medullaris and cauda equina: Conus extends to the L1 level. Conus and cauda equina appear normal.   Paraspinal and other soft tissues: Negative for paraspinous mass or adenopathy.   Disc levels:   T11-12: Left-sided disc protrusion. Mild flattening of the left side of the cord. Mild left foraminal narrowing   T12-L1: Extensive disc degeneration. Small central disc protrusion. Extruded disc fragment in the midline behind the L1 vertebral body. No significant spinal stenosis. Moderate left foraminal stenosis.   L1-2: Central disc protrusion with upgoing extruded disc fragment in the midline. Disc degeneration with endplate spurring right greater than left. Moderate subarticular stenosis on the right. Mild spinal stenosis.   L2-3: Disc degeneration with disc space narrowing and spurring left greater than right. Moderate to  severe subarticular stenosis on the left. Mild spinal stenosis.   L3-4: Disc degeneration with spurring asymmetric on the left. Extraforaminal disc protrusion and spurring on the left. Moderate subarticular and foraminal stenosis on the left due to spurring. Mild facet degeneration   L4-5: Disc degeneration and spurring left greater than right. Moderate subarticular and foraminal stenosis on the left due to disc protrusion and spurring. Mild facet degeneration   L5-S1: Disc degeneration and disc bulging with small central disc protrusion. Mild facet degeneration. Moderate foraminal stenosis bilaterally due to spurring.   IMPRESSION: Lumbar levoscoliosis. Multilevel disc and facet degeneration throughout the lower thoracic and lumbar spine causing stenosis as above.   Negative for fracture or mass.     Electronically Signed   By: Carlin Gaskins M.D.   On: 11/28/2019 17:58     Objective:  VS:  HT:    WT:   BMI:     BP:(!) 147/89  HR:65bpm  TEMP: ( )  RESP:  Physical Exam Vitals and nursing note reviewed.  Constitutional:      General: She is not in acute distress.    Appearance: Normal appearance. She is not ill-appearing.  HENT:     Head: Normocephalic and atraumatic.     Right Ear: External ear normal.     Left Ear: External ear  normal.  Eyes:     Extraocular Movements: Extraocular movements intact.  Cardiovascular:     Rate and Rhythm: Normal rate.     Pulses: Normal pulses.  Pulmonary:     Effort: Pulmonary effort is normal. No respiratory distress.  Abdominal:     General: There is no distension.     Palpations: Abdomen is soft.  Musculoskeletal:        General: Tenderness present.     Cervical back: Neck supple.     Right lower leg: No edema.     Left lower leg: No edema.     Comments: Patient has good distal strength with no pain over the greater trochanters.  No clonus or focal weakness.  Skin:    Findings: No erythema, lesion or rash.   Neurological:     General: No focal deficit present.     Mental Status: She is alert and oriented to person, place, and time.     Sensory: No sensory deficit.     Motor: No weakness or abnormal muscle tone.     Coordination: Coordination normal.  Psychiatric:        Mood and Affect: Mood normal.        Behavior: Behavior normal.      Imaging: XR C-ARM NO REPORT Result Date: 06/02/2024 Please see Notes tab for imaging impression.

## 2024-06-02 NOTE — Patient Instructions (Signed)

## 2024-06-02 NOTE — Progress Notes (Signed)
 Pain Scale   Average Pain 4 Patient advising she has chronic lower back pain radiating bilaterally to hip. Patient advising standing/walking increase her pain and resting and medication relieve her pain         +Driver, -BT, -Dye Allergies.

## 2024-06-05 ENCOUNTER — Other Ambulatory Visit: Payer: Self-pay | Admitting: Physical Medicine and Rehabilitation

## 2024-08-22 DIAGNOSIS — Z008 Encounter for other general examination: Secondary | ICD-10-CM | POA: Diagnosis not present

## 2024-08-22 DIAGNOSIS — Z860100 Personal history of colon polyps, unspecified: Secondary | ICD-10-CM | POA: Diagnosis not present

## 2024-08-22 DIAGNOSIS — E785 Hyperlipidemia, unspecified: Secondary | ICD-10-CM | POA: Diagnosis not present

## 2024-08-22 DIAGNOSIS — I10 Essential (primary) hypertension: Secondary | ICD-10-CM | POA: Diagnosis not present

## 2024-08-24 ENCOUNTER — Other Ambulatory Visit: Payer: Self-pay

## 2024-08-24 DIAGNOSIS — Z1231 Encounter for screening mammogram for malignant neoplasm of breast: Secondary | ICD-10-CM

## 2024-08-31 ENCOUNTER — Ambulatory Visit: Admitting: Physician Assistant

## 2024-09-01 ENCOUNTER — Other Ambulatory Visit: Payer: Self-pay | Admitting: Cardiovascular Disease

## 2024-09-02 ENCOUNTER — Ambulatory Visit
Admission: RE | Admit: 2024-09-02 | Discharge: 2024-09-02 | Disposition: A | Discharge status: Home / Self Care | Source: Ambulatory Visit

## 2024-09-02 DIAGNOSIS — Z1231 Encounter for screening mammogram for malignant neoplasm of breast: Secondary | ICD-10-CM | POA: Diagnosis not present

## 2024-11-07 ENCOUNTER — Other Ambulatory Visit: Payer: Self-pay

## 2024-11-07 ENCOUNTER — Ambulatory Visit (HOSPITAL_COMMUNITY): Admission: EM | Admit: 2024-11-07 | Discharge: 2024-11-08 | Disposition: A | Attending: Family | Admitting: Family

## 2024-11-07 DIAGNOSIS — R45851 Suicidal ideations: Secondary | ICD-10-CM | POA: Diagnosis not present

## 2024-11-07 DIAGNOSIS — F3130 Bipolar disorder, current episode depressed, mild or moderate severity, unspecified: Secondary | ICD-10-CM | POA: Diagnosis not present

## 2024-11-07 LAB — MAGNESIUM: Magnesium: 1.9 mg/dL (ref 1.7–2.4)

## 2024-11-07 LAB — POCT URINE DRUG SCREEN - MANUAL ENTRY (I-SCREEN)
POC Amphetamine UR: NOT DETECTED
POC Buprenorphine (BUP): NOT DETECTED
POC Cocaine UR: POSITIVE — AB
POC Marijuana UR: NOT DETECTED
POC Methadone UR: NOT DETECTED
POC Methamphetamine UR: NOT DETECTED
POC Morphine: NOT DETECTED
POC Oxazepam (BZO): NOT DETECTED
POC Oxycodone UR: NOT DETECTED
POC Secobarbital (BAR): NOT DETECTED

## 2024-11-07 LAB — CBC WITH DIFFERENTIAL/PLATELET
Abs Immature Granulocytes: 0.05 K/uL (ref 0.00–0.07)
Basophils Absolute: 0.1 K/uL (ref 0.0–0.1)
Basophils Relative: 1 %
Eosinophils Absolute: 0.3 K/uL (ref 0.0–0.5)
Eosinophils Relative: 4 %
HCT: 38.5 % (ref 36.0–46.0)
Hemoglobin: 12.9 g/dL (ref 12.0–15.0)
Immature Granulocytes: 1 %
Lymphocytes Relative: 15 %
Lymphs Abs: 1.4 K/uL (ref 0.7–4.0)
MCH: 30.6 pg (ref 26.0–34.0)
MCHC: 33.5 g/dL (ref 30.0–36.0)
MCV: 91.4 fL (ref 80.0–100.0)
Monocytes Absolute: 0.8 K/uL (ref 0.1–1.0)
Monocytes Relative: 8 %
Neutro Abs: 6.9 K/uL (ref 1.7–7.7)
Neutrophils Relative %: 71 %
Platelets: 384 K/uL (ref 150–400)
RBC: 4.21 MIL/uL (ref 3.87–5.11)
RDW: 12.6 % (ref 11.5–15.5)
WBC: 9.4 K/uL (ref 4.0–10.5)
nRBC: 0 % (ref 0.0–0.2)

## 2024-11-07 LAB — TSH: TSH: 0.976 u[IU]/mL (ref 0.350–4.500)

## 2024-11-07 LAB — COMPREHENSIVE METABOLIC PANEL WITH GFR
ALT: 17 U/L (ref 0–44)
AST: 22 U/L (ref 15–41)
Albumin: 3.7 g/dL (ref 3.5–5.0)
Alkaline Phosphatase: 86 U/L (ref 38–126)
Anion gap: 6 (ref 5–15)
BUN: 26 mg/dL — ABNORMAL HIGH (ref 8–23)
CO2: 32 mmol/L (ref 22–32)
Calcium: 9.4 mg/dL (ref 8.9–10.3)
Chloride: 105 mmol/L (ref 98–111)
Creatinine, Ser: 0.95 mg/dL (ref 0.44–1.00)
GFR, Estimated: >60 mL/min (ref >60)
Glucose, Bld: 101 mg/dL — ABNORMAL HIGH (ref 70–99)
Potassium: 3.3 mmol/L — ABNORMAL LOW (ref 3.5–5.1)
Sodium: 143 mmol/L (ref 135–145)
Total Bilirubin: 0.5 mg/dL (ref 0.0–1.2)
Total Protein: 6.8 g/dL (ref 6.5–8.1)

## 2024-11-07 LAB — ETHANOL: Alcohol, Ethyl (B): <15 mg/dL (ref <15)

## 2024-11-07 LAB — POC SARS CORONAVIRUS 2 AG: SARSCOV2ONAVIRUS 2 AG: NEGATIVE

## 2024-11-07 MED ORDER — NICOTINE 14 MG/24HR TD PT24
14.0000 mg | MEDICATED_PATCH | Freq: Every day | TRANSDERMAL | Status: DC
  Administered 2024-11-07: 14 mg via TRANSDERMAL
  Filled 2024-11-07: qty 1

## 2024-11-07 MED ORDER — ROSUVASTATIN CALCIUM 5 MG PO TABS: 5.0000 mg | ORAL_TABLET | Freq: Every day | ORAL | Status: DC

## 2024-11-07 MED ORDER — MAGNESIUM HYDROXIDE 400 MG/5ML PO SUSP: 30.0000 mL | Freq: Every day | ORAL | Status: DC | PRN

## 2024-11-07 MED ORDER — ACETAMINOPHEN 325 MG PO TABS: 650.0000 mg | ORAL_TABLET | Freq: Four times a day (QID) | ORAL | Status: DC | PRN

## 2024-11-07 MED ORDER — OLANZAPINE 5 MG PO TBDP: 5.0000 mg | ORAL_TABLET | Freq: Three times a day (TID) | ORAL | Status: DC | PRN

## 2024-11-07 MED ORDER — GABAPENTIN 300 MG PO CAPS
300.0000 mg | ORAL_CAPSULE | Freq: Three times a day (TID) | ORAL | Status: DC
  Administered 2024-11-07: 300 mg via ORAL
  Filled 2024-11-07: qty 1

## 2024-11-07 MED ORDER — METOPROLOL TARTRATE 50 MG PO TABS: 50.0000 mg | ORAL_TABLET | Freq: Two times a day (BID) | ORAL | Status: DC | PRN

## 2024-11-07 MED ORDER — OLANZAPINE 10 MG IM SOLR: 5.0000 mg | Freq: Three times a day (TID) | INTRAMUSCULAR | Status: DC | PRN

## 2024-11-07 MED ORDER — ALUM & MAG HYDROXIDE-SIMETH 200-200-20 MG/5ML PO SUSP: 30.0000 mL | ORAL | Status: DC | PRN

## 2024-11-07 MED ORDER — TRAZODONE HCL 50 MG PO TABS
25.0000 mg | ORAL_TABLET | Freq: Every evening | ORAL | Status: DC | PRN
  Administered 2024-11-07: 25 mg via ORAL
  Filled 2024-11-07: qty 1

## 2024-11-07 MED ORDER — IRBESARTAN 150 MG PO TABS: 300.0000 mg | ORAL_TABLET | Freq: Every day | ORAL | Status: DC

## 2024-11-07 NOTE — ED Notes (Signed)
 Pt sad, flat affect. Denies SI/HI/AVH at present time. Contracts for safety. Pt is aware of transfer to inpt and process reviewed. She voices understanding. No noted distress. Will continue to monitor for safety

## 2024-11-07 NOTE — ED Notes (Signed)
Patient sleeping with no s/s of distress.

## 2024-11-07 NOTE — BH Assessment (Addendum)
 Comprehensive Clinical Assessment (CCA) Note  11/07/2024 [name] Hinton [id number]  Disposition: Per [name] Dawn, NP inpatient treatment is recommended.  [facility] to review.  Disposition SW to pursue appropriate inpatient options.  The patient demonstrates the following risk factors for suicide: Chronic risk factors for suicide include: psychiatric disorder of Bipolar and previous suicide attempts x2, within the past week, overdose 1 wk ago and attempted to cut wrist today, leaving suicide note for mother. . Acute risk factors for suicide include: social withdrawal/isolation and loss (financial, interpersonal, professional). Protective factors for this patient include: positive social support. Considering these factors, the overall suicide risk at this point appears to be moderate. Patient is appropriate for outpatient follow up, once stabilized.   Patient is a 63 year old female with a history of Bipolar Disorder who presents voluntarily to [facility] for assessment. Patient presents via [facility] via [employer] with chief complaint of suicide attempt. [employer] was contacted by patient's mother after patient place a note on the bathroom door stating: Mom do not open this door!  Just call 911, and let them come in here.  You do not need to see me.  I am sorry!  This is what you need to tell the 911 operator: Tell them I told you not to come in.  I have slit my wrist and bled out.  I am dead (I hope!).   Patient endorses having suicidal thoughts for the past week and she confirms that today she locked herself in the bathroom with a knife and started to cut her wrist. Client also reported overdosing on 1400mg  of her heart medications a week ago. Patient reports stressors/triggers have been losses, having lost her father in 12/30/2023 and her best friend 5 months later. Patient states she has not grieved properly and she did not want to be a bother to her mother whom she cares for.  She reports worsening  depressive symptoms for approximately a month to include low energy, low motivation, insomnia, feelings of worthlessness, hopelessness and anxiety.  Patient does not have outpatient services and has been off of medications for the past 6 months. Patient reports a history of inpatient treatment at [facility]. She continues to endorse SI with plan.  Patient is unable to reliably affirm her safety.  She denies HI, AVH and recent substance use.    Chief Complaint:  Chief Complaint  Patient presents with   Suicidal   Visit Diagnosis: Bipolar Disorder    CCA Screening, Triage and Referral (STR)  Patient Reported Information How did you hear about us ? Legal System  What Is the Reason for Your Visit/Call Today? [name] Hinton is a 63 year old female with a history of presenting to [facility] via [facility] with chief complaint of suicidal attempt. Pt reports SI for the past week and today she locked herself in the bathroom with a knife and started to cut her wrist. Client also reported overdosing on 1400mg  of her heart medications a week ago. Pt reports stressors related to her father death in December 30, 2023 and her best friend dying 5 months later. 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 Client does not have outpatient services and has been without her psych meds for the past six months. Client reports a history of inpatient treatment at [facility]. Client continues to endorse SI with plan. Client denies HI, AVH. Client reports HBP and a heart condition and she has  not taken any of her medications today. Client also wrote a suicide note.  How Long Has This Been Causing You Problems? 1 wk - 1 month  What Do You Feel Would Help You the Most Today? Treatment for Depression or other mood problem   Have You Recently Had Any Thoughts About Hurting Yourself? Yes  Are You Planning to Commit Suicide/Harm Yourself At This time? Yes   Flowsheet Row ED from 11/07/2024 in  [facility]02[facility]2021 in [facility]  C-SSRS RISK CATEGORY High Risk Low Risk Low Risk    Have you Recently Had Thoughts About Hurting Someone [place]? No  Are You Planning to Harm Someone at This Time? No  Explanation: N/A   Have You Used Any Alcohol  or Drugs in the Past 24 Hours? No  How Long Ago Did You Use Drugs or Alcohol ? N/A What Did You Use and How Much? N/A  Do You Currently Have a Therapist/Psychiatrist? No  Name of Therapist/Psychiatrist:    Have You Been Recently Discharged From Any Office Practice or Programs? No  Explanation of Discharge From Practice/Program: N/A    CCA Screening Triage Referral Assessment Type of Contact: Face-to-Face  Telemedicine Service Delivery:   Is this Initial or Reassessment?   Date Telepsych consult ordered in [facility]:    Time Telepsych consult ordered in [facility]:    Location of Assessment: [facility]  Provider Location: [facility] Assessment Services   Collateral Involvement: None   Does Patient Have a Automotive Engineer Guardian? No  Legal Guardian Contact Information: N/A  Copy of Legal Guardianship Form: -- (N/A)  Legal Guardian Notified of Arrival: -- (N/A)  Legal Guardian Notified of Pending Discharge: -- (N/A)  If Minor and Not Living with Parent(s), Who has Custody? N/A  Is CPS involved or ever been involved? Never  Is APS involved or ever been involved? Never   Patient Determined To Be At Risk for Harm To Self or Others Based on Review of Patient Reported Information or Presenting Complaint? Yes, for Self-Harm  Method: -- (N/A, no HI)  Availability of Means: -- (N/A, no HI)  Intent: -- (N/A, no HI)  Notification Required: -- (N/A, no HI)  Additional Information for Danger to Others Potential: -- (N/A, no HI)  Additional Comments for Danger to Others  Potential: N/A, no HI  Are There Guns or Other Weapons in Your Home? No  Types of Guns/Weapons: N/A  Are These Weapons Safely Secured?                            -- (N/A)  Who Could Verify You Are Able To Have These Secured: N/A  Do You Have any Outstanding Charges, Pending Court Dates, Parole/Probation? None  Contacted To Inform of Risk of Harm To Self or Others: Patent Examiner; Family/Significant Other:    Does Patient Present under Involuntary Commitment? No    [place] of Residence: [place]   Patient Currently Receiving the Following Services: Not Receiving Services   Determination of Need: Urgent (48 hours)   Options For Referral: Medication Management; Inpatient Hospitalization; Intensive Outpatient Therapy; Partial Hospitalization; Facility-Based Crisis     CCA Biopsychosocial Patient Reported Schizophrenia/Schizoaffective Diagnosis in Past: No   Strengths: Patient is open to treatment. Patient has family support.   Mental Health Symptoms Depression:  Difficulty Concentrating; Fatigue; Hopelessness; Sleep (too  much or little); Tearfulness; Change in energy/activity; Worthlessness   Duration of Depressive symptoms: Duration of Depressive Symptoms: Greater than two weeks   Mania:  None   Anxiety:   Worrying; Tension   Psychosis:  None   Duration of Psychotic symptoms:    Trauma:  None   Obsessions:  None   Compulsions:  None   Inattention:  N/A   Hyperactivity/Impulsivity:  N/A   Oppositional/Defiant Behaviors:  N/A   Emotional Irregularity:  Chronic feelings of emptiness   Other Mood/Personality Symptoms:  NA    Mental Status Exam Appearance and self-care  Stature:  Average   Weight:  Average weight   Clothing:  Casual   Grooming:  Normal   Cosmetic use:  Age appropriate   Posture/gait:  Normal   Motor activity:  Not Remarkable   Sensorium  Attention:  Normal   Concentration:  Normal   Orientation:  X5   Recall/memory:   Normal   Affect and Mood  Affect:  Appropriate   Mood:  Depressed   Relating  Eye contact:  Normal   Facial expression:  Depressed   Attitude toward examiner:  Cooperative   Thought and Language  Speech flow: Clear and Coherent   Thought content:  Appropriate to Mood and Circumstances   Preoccupation:  None   Hallucinations:  None   Organization:  Goal-directed; Coherent   [employer] of Knowledge:  Average   Intelligence:  Average   Abstraction:  Normal   Judgement:  Fair   Dance Movement Psychotherapist:  Adequate   Insight:  Gaps   Decision Making:  Normal; Vacilates   Social Functioning  Social Maturity:  Isolates   Social Judgement:  Victimized   Stress  Stressors:  Family conflict; Illness; Financial   Coping Ability:  Exhausted; Overwhelmed   Skill Deficits:  Decision making   Supports:  Family; Friends/Service system     Religion: Religion/Spirituality Are You A Religious Person?: Yes What is Your Religious Affiliation?:  (NA) How Might This Affect Treatment?: NA  Leisure/Recreation: Leisure / Recreation Do You Have Hobbies?: No  Exercise/Diet: Exercise/Diet Do You Exercise?: No Have You Gained or Lost A Significant Amount of Weight in the Past Six Months?: No Do You Follow a Special Diet?: No Do You Have Any Trouble Sleeping?: Yes Explanation of Sleeping Difficulties: varies   CCA Employment/Education Employment/Work Situation: Employment / Work Situation Employment Situation: On disability Why is Patient on Disability: Scoliosis How Long has Patient Been on Disability: ten years Patient's Job has Been Impacted by Current Illness:  (n/a) Has Patient ever Been in the [employer]?: No  Education: Education Is Patient Currently Attending School?: No Last Grade Completed: 12 (unknown) Did You Attend College?: Yes What Type of College Degree Do you Have?: some college Did You Have An Individualized Education Program (IIEP):  No Did You Have Any Difficulty At School?: No Patient's Education Has Been Impacted by Current Illness: No   CCA Family/Childhood History Family and Relationship History: Family history Marital status: Single Does patient have children?: No  Childhood History:  Childhood History By whom was/is the patient raised?: Both parents Did patient suffer any verbal/emotional/physical/sexual abuse as a child?: No Did patient suffer from severe childhood neglect?: No Has patient ever been sexually abused/assaulted/raped as an adolescent or adult?: No Was the patient ever a victim of a crime or a disaster?: No Witnessed domestic violence?: No Has patient been affected by domestic violence as an adult?: No  CCA Substance Use Alcohol /Drug Use: Alcohol  / Drug Use Pain Medications: Please see MAR Prescriptions: Please see MAR Over the Counter: Please see MAR History of alcohol  / drug use?: No history of alcohol  / drug abuse       ASAM's:  Six Dimensions of Multidimensional Assessment  Dimension 1:  Acute Intoxication and/or Withdrawal Potential:      Dimension 2:  Biomedical Conditions and Complications:      Dimension 3:  Emotional, Behavioral, or Cognitive Conditions and Complications:     Dimension 4:  Readiness to Change:     Dimension 5:  Relapse, Continued use, or Continued Problem Potential:     Dimension 6:  Recovery/Living Environment:     ASAM Severity Score:    ASAM Recommended Level of Treatment:     Substance use Disorder (SUD)    Recommendations for Services/Supports/Treatments:    Disposition Recommendation per psychiatric provider: We recommend inpatient psychiatric hospitalization when medically cleared. Patient is under voluntary admission status at this time; please IVC if attempts to leave hospital.   DSM5 Diagnoses: Patient Active Problem List   Diagnosis Date Noted   Medication management 10/02/2021   Bipolar I disorder, most recent episode  depressed (HCC)    Cellulitis of left breast 07/25/2021   Depression with anxiety 07/25/2021   Abscess of left breast 07/25/2021   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   Low back pain 09/24/2011   Restless legs syndrome (RLS) 03/24/2011   Depressive disorder, not elsewhere classified 09/17/2010   Bipolar I disorder, single manic episode (HCC) 08/20/2010   Benign essential hypertension 05/14/2010   Hyperlipemia 05/14/2010   Osteopenia 12/03/2009   Obsessive-compulsive disorder 10/17/2009     Referrals to Alternative Service(s): Referred to Alternative Service(s):   Place:   Date:   Time:    Referred to Alternative Service(s):   Place:   Date:   Time:    Referred to Alternative Service(s):   Place:   Date:   Time:    Referred to Alternative Service(s):   Place:   Date:   Time:     Deland LITTIE Louder, [facility]

## 2024-11-07 NOTE — Progress Notes (Addendum)
 Pt was accepted to [facility] on 11/07/2024  Bed Assignment: 34   Address: [address] [place], [place], [place] [address]  Pt meets inpatient criteria per  Ellouise Dawn, NP   Attending Physician will be Dr. Allyn Donnelly COME  Report can be called to: [phone]  Pt can arrive pending labs  Care Team notified: [facility] Cherylynn Ernst, RN, Felton Gainer, RN, Ellouise Dawn, NP

## 2024-11-07 NOTE — ED Provider Notes (Addendum)
 [facility] Continuous Assessment Admission H&P  Date: 11/07/24 Patient Name: [name] Hinton MRN: [id number] Chief Complaint: Suicidal ideation  Diagnoses:  Final diagnoses:  Bipolar I disorder, most recent episode depressed (HCC)  Suicidal ideation    HPI: Patient presents voluntarily to [facility] behavioral health transported by patent examiner.  Law enforcement contacted by patient's mother after patient placed a note on bathroom door.  Note stated:  Mom do not open this door!!  Just call 911.  And let them come in here.  You do not need to see me.  I am sorry!  Love always.  This is what you need to tell 911 operator: Tell them I told you do not come IN.  I have slit my wrist and bled out.  I am dead.  (I hope!!).SABRASABRA  [name] Hinton reports got into bathtub today with a plan to slit her wrist however reports too painful made approximately 0.5 inch superficial laceration to anterior left wrist.  Patient shares primary stressor includes my dad died then my best friend 5 months later.  I am not allowed to grieve, it is not allowed my family.  I do not want to be a burden to my mother.  There is no need for me to be here. I don't want to live anymore.  [name] Hinton ingested an intentional overdose of metoprolol  (unknown amount) approximately 7 days ago.  Patient did not report this attempt.  States I just woke up the next day.  Approximately 20 years ago suicide attempt via intentional overdose.  3 previous inpatient psychiatric hospitalizations including behavioral health at [facility] in 2010, 2008 and 2006.  Patient endorses depressive symptoms worsening x 1 month including:  depressed mood, anhedonia, insomnia, fatigue, feelings of worthlessness, hopelessness, anxiety. Patient endorses weight loss, approximately 30 pounds, over the last 12 months.  She identifies average appetite.  She states I have gone to every doctor around and I do not know why I have lost the weight.  [name] Hinton is not  followed by outpatient psychiatry currently.  Most recent outpatient psychiatry follow-up 1 year ago.  Most recent compliance with medications to address mood, Vraylar , also 1 year ago.Reports Vraylar  caused restlessness, I cannot go through that again.  Previously prescribed clonazepam  to address anxiety would like this medication restarted.  Patient prefers to restart trazodone  25 mg.  Previously trazodone  50 mg caused residual sedation.  Patient denies HI, denies AVH.  There is no evidence of delusional thought content no indication that patient is responding to internal stimuli.  Chart reviewed and patient assessed, face-to-face, on 11/07/2024.  Patient reviewed with attending psychiatrist, Dr. Lawrnce.  Patient is seated, no apparent distress.  She is alert and oriented, cooperative during assessment.  She presents with depressed mood, congruent affect.  Patient is at elevated risk of suicide and further worsening of psychiatric conditions. Risk factors for suicide for this patient include: no history of significant relationship, current treatment non-adherence , hopelessness, suicide plan. Protective factors for this patient include: no known access to weapons or firearms and motivation for treatment, supportive relationship. Due to active suicidal ideation with plan, treatment non-adherence and lack of adequate outpatient psychiatric follow up, recommend inpatient hospitalization for safety, stabilization, and further evaluation. The treatment plan, including the need for inpatient admission, was discussed with the patient, who verbalized understanding and agreement with plan. The patient will remain under supervision on the observation unit while awaiting bed placement at an inpatient facility.  Patient is aware of need for  involuntary commitment petition if inpatient psychiatric treatment refused or if patient attempts to leave facility.   Total Time spent with patient: 30  minutes  Musculoskeletal  Strength & Muscle Tone: within normal limits Gait & Station: normal Patient leans: N/A  Psychiatric Specialty Exam  Presentation General Appearance:  Appropriate for Environment; Casual  Eye Contact: Good  Speech: Clear and Coherent; Normal Rate  Speech Volume: Normal  Handedness: Right   Mood and Affect  Mood: Depressed  Affect: Depressed   Thought Process  Thought Processes: Coherent; Goal Directed; Linear  Descriptions of Associations:Intact  Orientation:Full (Time, Place and Person)  Thought Content:Logical; WDL  Diagnosis of Schizophrenia or Schizoaffective disorder in past: No   Hallucinations:Hallucinations: None  Ideas of Reference:None  Suicidal Thoughts:Suicidal Thoughts: Yes, Active SI Active Intent and/or Plan: With Intent; With Plan; With Means to Carry Out; With Access to Means  Homicidal Thoughts:Homicidal Thoughts: No   Sensorium  Memory: Immediate Good; Recent Good  Judgment: Fair  Insight: Fair   Executive Functions  Concentration: Good  Attention Span: Good  Recall: Good  Fund of Knowledge: Good  Language: Good   Psychomotor Activity  Psychomotor Activity: Psychomotor Activity: Normal   Assets  Assets: Communication Skills; Desire for Improvement; Financial Resources/Insurance; Housing   Sleep  Sleep: Sleep: Poor Number of Hours of Sleep: 5   Nutritional Assessment (For OBS and FBC admissions only) Has the patient had a weight loss or gain of 10 pounds or more in the last 3 months?: Yes Has the patient had a decrease in food intake/or appetite?: No Does the patient have dental problems?: No Does the patient have eating habits or behaviors that may be indicators of an eating disorder including binging or inducing vomiting?: No Has the patient recently lost weight without trying?: 3 Has the patient been eating poorly because of a decreased appetite?: 0 Malnutrition  Screening Tool Score: 3 Nutritional Assessment Referrals: Refer for Nutritional Consult to Nutrition and Diabetes Service    Physical Exam Vitals and nursing note reviewed.  Constitutional:      Appearance: Normal appearance. She is well-developed.  HENT:     Head: Normocephalic and atraumatic.     Nose: Nose normal.  Cardiovascular:     Rate and Rhythm: Normal rate and regular rhythm.     Heart sounds: Normal heart sounds.  Pulmonary:     Effort: Pulmonary effort is normal.     Breath sounds: Normal breath sounds.  Musculoskeletal:        General: Normal range of motion.     Cervical back: Normal range of motion.  Skin:    General: Skin is warm and dry.     Comments: Superficial laceration, approx 0.5,to left anterior wrist, no bleeding, open to air  Neurological:     Mental Status: She is alert and oriented to person, place, and time.  Psychiatric:        Attention and Perception: Attention and perception normal.        Mood and Affect: Affect normal. Mood is depressed.        Speech: Speech normal.        Behavior: Behavior normal. Behavior is cooperative.        Thought Content: Thought content includes suicidal ideation. Thought content includes suicidal plan.        Cognition and Memory: Cognition and memory normal.    Review of Systems  Constitutional: Negative.   HENT: Negative.    Eyes: Negative.   Respiratory: Negative.  Cardiovascular: Negative.   Gastrointestinal: Negative.   Genitourinary: Negative.   Musculoskeletal: Negative.   Skin: Negative.   Neurological: Negative.   Psychiatric/Behavioral:  Positive for depression and suicidal ideas. The patient has insomnia.     Blood pressure 120/64, pulse 79, temperature 98.5 F (36.9 C), temperature source Oral, resp. rate 16, SpO2 98%. There is no height or weight on file to calculate BMI.  Past Psychiatric History: GAD, bipolar affective disorder, major depressive disorder, obsessive-compulsive  disorder, bipolar 1 disorder,  Is the patient at risk to self? Yes  Has the patient been a risk to self in the past 6 months? Yes .    Has the patient been a risk to self within the distant past? Yes   Is the patient a risk to others? No   Has the patient been a risk to others in the past 6 months? No   Has the patient been a risk to others within the distant past? No   Past Medical History: Chronic headache, hypercholesterolemia, hypertension, osteopenia, restless leg syndrome, low back pain, fibroadenoma of left breast, abscess of left breast  Family History: Father: Major depressive disorder  Social History: Resides with mother, receives disability income, alcohol  average 1 drink 3-4 times per year.  Substance use aside from alcohol  denied.  Last Labs:  No visits with results within 6 Month(s) from this visit.  Latest known visit with results is:  Office Visit on 03/07/2024  Component Date Value Ref Range Status   Cholesterol, Total 03/07/2024 174  100 - 199 mg/dL Final   Triglycerides 95/92/7974 62  0 - 149 mg/dL Final   HDL 95/92/7974 62  >39 mg/dL Final   VLDL Cholesterol Cal 03/07/2024 12  5 - 40 mg/dL Final   LDL Chol Calc (NIH) 03/07/2024 100 (H)  0 - 99 mg/dL Final   Chol/HDL Ratio 03/07/2024 2.8  0.0 - 4.4 ratio Final   Comment:                                   T. Chol/HDL Ratio                                             Men  Women                               1/2 Avg.Risk  3.4    3.3                                   Avg.Risk  5.0    4.4                                2X Avg.Risk  9.6    7.1                                3X Avg.Risk 23.4   11.0    Glucose 03/07/2024 64 (L)  70 - 99 mg/dL Final   BUN 95/92/7974 17  8 - 27 mg/dL Final   Creatinine, Ser 03/07/2024 0.91  0.57 - 1.00 mg/dL Final   eGFR 95/92/7974 71  >59 mL/min/1.73 Final   BUN/Creatinine Ratio 03/07/2024 19  12 - 28 Final   Sodium 03/07/2024 142  134 - 144 mmol/L Final   Potassium 03/07/2024 3.4 (L)   3.5 - 5.2 mmol/L Final   Chloride 03/07/2024 100  96 - 106 mmol/L Final   CO2 03/07/2024 22  20 - 29 mmol/L Final   Calcium  03/07/2024 9.9  8.7 - 10.3 mg/dL Final    Allergies: Quetiapine  and Vancomycin   Medications:  Facility Ordered Medications  Medication   acetaminophen  (TYLENOL ) tablet 650 mg   alum & mag hydroxide-simeth (MAALOX/MYLANTA) 200-200-20 MG/5ML suspension 30 mL   magnesium  hydroxide (MILK OF MAGNESIA) suspension 30 mL   OLANZapine  zydis (ZYPREXA ) disintegrating tablet 5 mg   OLANZapine  (ZYPREXA ) injection 5 mg   traZODone  (DESYREL ) tablet 25 mg   PTA Medications  Medication Sig   buPROPion  (WELLBUTRIN  XL) 150 MG 24 hr tablet Take 150 mg by mouth every morning.   Multiple Vitamin (MULTIVITAMIN WITH MINERALS) TABS Take 1 tablet by mouth every morning.   clonazePAM  (KLONOPIN ) 0.5 MG tablet Take 0.5 mg by mouth 2 (two) times daily as needed for anxiety.   simvastatin  (ZOCOR ) 20 MG tablet Take 20 mg by mouth at bedtime.   aspirin  EC 81 MG tablet Take 81 mg by mouth at bedtime. Swallow whole.   omeprazole (PRILOSEC OTC) 20 MG tablet Take 20 mg by mouth daily as needed (for reflux).   Aspirin -Acetaminophen -Caffeine (GOODY HEADACHE PO) Take 1 packet by mouth as needed (for headaches).   TYLENOL  8 HOUR 650 MG CR tablet Take 650-1,300 mg by mouth every 8 (eight) hours as needed for pain (or headaches).   naproxen  sodium (ALEVE ) 220 MG tablet Take 220-440 mg by mouth 2 (two) times daily as needed (for mild pain or headaches).   ibuprofen (ADVIL) 200 MG tablet Take 800 mg by mouth every 6 (six) hours as needed for headache or mild pain.   ARTIFICIAL TEARS PF 0.1-0.3 % SOLN Place 1 drop into both eyes 3 (three) times daily as needed (for dryness).   VRAYLAR  1.5 MG capsule Take 1.5 mg by mouth daily.   valsartan (DIOVAN) 320 MG tablet Take 320 mg by mouth daily.   Calcium  Carb-Cholecalciferol 600-5 MG-MCG TABS Take 1 tablet by mouth daily.   rOPINIRole  (REQUIP ) 1 MG tablet Take  1 mg by mouth at bedtime.   spironolactone -hydrochlorothiazide  (ALDACTAZIDE) 25-25 MG tablet Take 1 tablet by mouth daily.   gabapentin  (NEURONTIN ) 300 MG capsule TAKE 1 CAPSULE BY MOUTH THREE TIMES A DAY   metoprolol  tartrate (LOPRESSOR ) 50 MG tablet TAKE 1 TABLET BY MOUTH 2 TIMES DAILY AS NEEDED (MAY TAKE 50MG  1-2 TIMES DAILY FOR IRREGULAR HEART BEAT).      Medical Decision Making  PLAN: --Admission: Observation unit, pending acceptance to inpatient facility --Disposition: Voluntary.  IVC if patient attempts to leave facility or refuses inpatient psychiatric treatment --Medical Interventions:    -Labs: CBC, CMP, TSH,  ethanol, magnesium , prolactin and EKG. Urine drug screen, urine pregnancy.   UDS: positive cocaine. EKG: NSR/QTc measures 452. COVID/SARS: negative     -Medications:      -acetaminophen  650 mg Q6H PRN for mild pain (pain score 1-3)      -Maalox suspension 30 mLs Q4H PRN for indigestion         -magnesium  hydroxide suspension 30 mLs daily PRN for mild constipation      -trazodone  25 mg daily at  bedtime PRN for sleep      -nicotine  patch 14mg  transdermal daily/nicotine  withdrawal     --Psychiatric Interventions:    -Agitation Protocol  Restart prior to admission medications including: -Gabapentin  300 mg 3 times daily/chronic back pain -Rosuvastatin  5 mg daily  Blood pressure today 120/64, heart rate 79.  Will restart prior to admission medications to address BP/HTN when verified/as needed.       Recommendations  Based on my evaluation the patient does not appear to have an emergency medical condition.  Ellouise LITTIE Dawn, FNP 11/07/24  6:25 PM

## 2024-11-07 NOTE — Progress Notes (Signed)
   11/07/24 1737  [facility] Triage Screening (Walk-ins at [facility] only)  What Is the Reason for Your Visit/Call Today? [name] Hinton is a 63 year old female with a history of presenting to [facility] via [facility] with chief complaint of suicidal attempt. Pt reports SI for the past week and today she locked herself in the bathroom with a knife and started to cut her wrist. Client also reported overdosing on 1400mg  of her heart medications a week ago. Pt reports stressors related to her father death in January 03, 2024 and her best friend dying 5 months later. 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 Client does not have outpatient services and has been without her psych meds for the past six months. Client reports a history of inpatient treatment at [facility]. Client continues to endorse SI with plan. Client denies HI, AVH. Client reports HBP and a heart condition and she has not taken any of her medications today. Client also wrote a suicide note.  How Long Has This Been Causing You Problems? 1 wk - 1 month  Have You Recently Had Any Thoughts About Hurting Yourself? Yes  How long ago did you have thoughts about hurting yourself? a week  Are You Planning to Commit Suicide/Harm Yourself At This time? Yes  Have you Recently Had Thoughts About Hurting Someone Sherral? No  Are You Planning To Harm Someone At This Time? No  Physical Abuse Denies  Verbal Abuse Denies  Sexual Abuse Denies  Exploitation of patient/patient's resources Denies  Self-Neglect Denies  Are you currently experiencing any auditory, visual or other hallucinations? No  Have You Used Any Alcohol  or Drugs in the Past 24 Hours? No  Do you have any current medical co-morbidities that require immediate attention? No  Clinician description of patient physical appearance/behavior: tearful, depressed  What Do You Feel Would Help You the Most Today? Treatment for Depression or other mood problem  If access to  [facility] was not available, would you have sought care in the Emergency Department? No  Determination of Need Urgent (48 hours)  Options For Referral Medication Management;Inpatient Hospitalization;Intensive Outpatient Therapy;Partial Hospitalization;Facility-Based Crisis  Determination of Need filed? Yes

## 2024-11-07 NOTE — ED Notes (Signed)
 Patient admitted to obs unit. Patient currently endorses passive SI with no current plan. Patient denies HI and A/V/H. Patient presents sad and depressed but cooperative. Patient oriented to unit/unit rules. Patient noted to have a self inflicted cut on her L wrist that she made with a kitchen knife. Patient offered a drink and meal. Patient aware of pending inpatient admission. Patient remains safe on unit. No s/s of current distress.

## 2024-11-08 ENCOUNTER — Inpatient Hospital Stay
Admission: AD | Admit: 2024-11-08 | Discharge: 2024-11-28 | DRG: 885 | Disposition: A | Discharge status: Home / Self Care | Source: Intra-hospital | Attending: Psychiatry | Admitting: Psychiatry

## 2024-11-08 ENCOUNTER — Encounter: Payer: Self-pay | Admitting: Psychiatry

## 2024-11-08 DIAGNOSIS — Z23 Encounter for immunization: Secondary | ICD-10-CM | POA: Diagnosis not present

## 2024-11-08 DIAGNOSIS — F17210 Nicotine dependence, cigarettes, uncomplicated: Secondary | ICD-10-CM | POA: Diagnosis present

## 2024-11-08 DIAGNOSIS — F319 Bipolar disorder, unspecified: Secondary | ICD-10-CM | POA: Diagnosis not present

## 2024-11-08 DIAGNOSIS — G2581 Restless legs syndrome: Secondary | ICD-10-CM | POA: Diagnosis present

## 2024-11-08 DIAGNOSIS — Z818 Family history of other mental and behavioral disorders: Secondary | ICD-10-CM

## 2024-11-08 DIAGNOSIS — F3130 Bipolar disorder, current episode depressed, mild or moderate severity, unspecified: Secondary | ICD-10-CM | POA: Diagnosis present

## 2024-11-08 DIAGNOSIS — Z90710 Acquired absence of both cervix and uterus: Secondary | ICD-10-CM | POA: Diagnosis not present

## 2024-11-08 DIAGNOSIS — F329 Major depressive disorder, single episode, unspecified: Secondary | ICD-10-CM | POA: Diagnosis not present

## 2024-11-08 DIAGNOSIS — Z91148 Patient's other noncompliance with medication regimen for other reason: Secondary | ICD-10-CM

## 2024-11-08 DIAGNOSIS — Z5900 Homelessness unspecified: Secondary | ICD-10-CM | POA: Diagnosis not present

## 2024-11-08 DIAGNOSIS — Z9151 Personal history of suicidal behavior: Secondary | ICD-10-CM

## 2024-11-08 DIAGNOSIS — Z9152 Personal history of nonsuicidal self-harm: Secondary | ICD-10-CM | POA: Diagnosis not present

## 2024-11-08 DIAGNOSIS — I10 Essential (primary) hypertension: Secondary | ICD-10-CM | POA: Diagnosis present

## 2024-11-08 DIAGNOSIS — E7800 Pure hypercholesterolemia, unspecified: Secondary | ICD-10-CM | POA: Diagnosis present

## 2024-11-08 LAB — SARS CORONAVIRUS 2 BY RT PCR: SARS Coronavirus 2 by RT PCR: NEGATIVE

## 2024-11-08 MED ORDER — DIPHENHYDRAMINE HCL 25 MG PO CAPS: 50.0000 mg | ORAL_CAPSULE | Freq: Three times a day (TID) | ORAL | Status: DC | PRN

## 2024-11-08 MED ORDER — HALOPERIDOL LACTATE 5 MG/ML IJ SOLN: 10.0000 mg | Freq: Three times a day (TID) | INTRAMUSCULAR | Status: DC | PRN

## 2024-11-08 MED ORDER — POLYVINYL ALCOHOL 1.4 % OP SOLN: 1.0000 [drp] | Freq: Three times a day (TID) | OPHTHALMIC | Status: DC | PRN

## 2024-11-08 MED ORDER — PANTOPRAZOLE SODIUM 40 MG PO TBEC: 40.0000 mg | DELAYED_RELEASE_TABLET | Freq: Every day | ORAL | Status: DC | PRN

## 2024-11-08 MED ORDER — ASPIRIN 81 MG PO TBEC
81.0000 mg | DELAYED_RELEASE_TABLET | Freq: Every day | ORAL | Status: DC
  Administered 2024-11-08 – 2024-11-27 (×20): 81 mg via ORAL
  Filled 2024-11-08 (×20): qty 1

## 2024-11-08 MED ORDER — HALOPERIDOL LACTATE 5 MG/ML IJ SOLN: 5.0000 mg | Freq: Three times a day (TID) | INTRAMUSCULAR | Status: DC | PRN

## 2024-11-08 MED ORDER — HALOPERIDOL 5 MG PO TABS: 5.0000 mg | ORAL_TABLET | Freq: Three times a day (TID) | ORAL | Status: DC | PRN

## 2024-11-08 MED ORDER — NAPROXEN 250 MG PO TABS: 250.0000 mg | ORAL_TABLET | Freq: Two times a day (BID) | ORAL | Status: DC | PRN

## 2024-11-08 MED ORDER — GABAPENTIN 300 MG PO CAPS
300.0000 mg | ORAL_CAPSULE | Freq: Three times a day (TID) | ORAL | Status: DC
  Administered 2024-11-08 – 2024-11-13 (×15): 300 mg via ORAL
  Filled 2024-11-08 (×15): qty 1

## 2024-11-08 MED ORDER — SIMVASTATIN 20 MG PO TABS
20.0000 mg | ORAL_TABLET | Freq: Every day | ORAL | Status: DC
  Administered 2024-11-08 – 2024-11-27 (×20): 20 mg via ORAL
  Filled 2024-11-08 (×20): qty 1

## 2024-11-08 MED ORDER — ALUM & MAG HYDROXIDE-SIMETH 200-200-20 MG/5ML PO SUSP
30.0000 mL | ORAL | Status: DC | PRN
  Administered 2024-11-09 – 2024-11-13 (×4): 30 mL via ORAL
  Filled 2024-11-08 (×4): qty 30

## 2024-11-08 MED ORDER — ACETAMINOPHEN 325 MG PO TABS
650.0000 mg | ORAL_TABLET | Freq: Four times a day (QID) | ORAL | Status: DC | PRN
  Administered 2024-11-08 – 2024-11-28 (×24): 650 mg via ORAL
  Filled 2024-11-08 (×23): qty 2

## 2024-11-08 MED ORDER — ROPINIROLE HCL 1 MG PO TABS
1.0000 mg | ORAL_TABLET | Freq: Every day | ORAL | Status: DC
  Administered 2024-11-08 – 2024-11-27 (×20): 1 mg via ORAL
  Filled 2024-11-08 (×20): qty 1

## 2024-11-08 MED ORDER — DIPHENHYDRAMINE HCL 50 MG/ML IJ SOLN: 50.0000 mg | Freq: Three times a day (TID) | INTRAMUSCULAR | Status: DC | PRN

## 2024-11-08 MED ORDER — IRBESARTAN 75 MG PO TABS
37.5000 mg | ORAL_TABLET | Freq: Every day | ORAL | Status: DC
  Administered 2024-11-08 – 2024-11-28 (×20): 37.5 mg via ORAL
  Filled 2024-11-08 (×22): qty 0.5

## 2024-11-08 MED ORDER — LORAZEPAM 2 MG/ML IJ SOLN: 2.0000 mg | Freq: Three times a day (TID) | INTRAMUSCULAR | Status: DC | PRN

## 2024-11-08 MED ORDER — TRAZODONE HCL 50 MG PO TABS
50.0000 mg | ORAL_TABLET | Freq: Every evening | ORAL | Status: DC | PRN
  Administered 2024-11-08 – 2024-11-20 (×13): 50 mg via ORAL
  Filled 2024-11-08 (×13): qty 1

## 2024-11-08 MED ORDER — HYDROXYZINE HCL 25 MG PO TABS
25.0000 mg | ORAL_TABLET | Freq: Three times a day (TID) | ORAL | Status: DC | PRN
  Administered 2024-11-11 – 2024-11-28 (×18): 25 mg via ORAL
  Filled 2024-11-08 (×20): qty 1

## 2024-11-08 MED ORDER — ADULT MULTIVITAMIN W/MINERALS CH
1.0000 | ORAL_TABLET | Freq: Every morning | ORAL | Status: DC
  Administered 2024-11-08 – 2024-11-28 (×21): 1 via ORAL
  Filled 2024-11-08 (×20): qty 1

## 2024-11-08 MED ORDER — METOPROLOL TARTRATE 25 MG PO TABS
25.0000 mg | ORAL_TABLET | Freq: Two times a day (BID) | ORAL | Status: DC | PRN
  Administered 2024-11-10 – 2024-11-17 (×4): 25 mg via ORAL
  Filled 2024-11-08 (×5): qty 1

## 2024-11-08 MED ORDER — HYDROCHLOROTHIAZIDE 25 MG PO TABS
25.0000 mg | ORAL_TABLET | Freq: Every day | ORAL | Status: DC
  Administered 2024-11-08 – 2024-11-28 (×21): 25 mg via ORAL
  Filled 2024-11-08 (×20): qty 1

## 2024-11-08 MED ORDER — CARIPRAZINE HCL 1.5 MG PO CAPS
1.5000 mg | ORAL_CAPSULE | Freq: Every day | ORAL | Status: DC
  Administered 2024-11-08 – 2024-11-10 (×3): 1.5 mg via ORAL
  Filled 2024-11-08 (×3): qty 1

## 2024-11-08 MED ORDER — MAGNESIUM HYDROXIDE 400 MG/5ML PO SUSP: 30.0000 mL | Freq: Every day | ORAL | Status: DC | PRN

## 2024-11-08 MED ORDER — SPIRONOLACTONE 25 MG PO TABS
25.0000 mg | ORAL_TABLET | Freq: Every day | ORAL | Status: DC
  Administered 2024-11-08 – 2024-11-28 (×21): 25 mg via ORAL
  Filled 2024-11-08 (×21): qty 1

## 2024-11-08 MED ORDER — SPIRONOLACTONE-HCTZ 25-25 MG PO TABS: 1.0000 | ORAL_TABLET | Freq: Every day | ORAL | Status: DC

## 2024-11-08 NOTE — Group Note (Signed)
 Date:  11/08/2024 Time:  4:23 PM  Group Topic/Focus:  Overcoming Stress:   The focus of this group is to define stress and help patients assess their triggers.    Participation Level:  Did Not Attend   [name] 11/08/2024, 4:23 PM

## 2024-11-08 NOTE — [facility] Suicide Risk Assessment (Signed)
 [facility] Inpatient Admission Suicide Risk Assessment   Nursing information obtained from:    Demographic factors:    Current Mental Status:    Loss Factors:    Historical Factors:    Risk Reduction Factors:     Total Time spent with patient: 30 minutes Principal Problem: Bipolar 1 disorder (HCC) Diagnosis:  Principal Problem:   Bipolar 1 disorder (HCC)  Subjective Data: Patient is a 62 year old female with a history of Bipolar Disorder who presents voluntarily to [facility] for assessment. Patient presents via [facility] via [employer] with chief complaint of suicide attempt. [employer] was contacted by patient's mother after patient place a note on the bathroom door stating: Mom do not open this door!  Just call 911, and let them come in here.  You do not need to see me.  I am sorry!  This is what you need to tell the 911 operator: Tell them I told you not to come in.  I have slit my wrist and bled out.  I am dead (I hope!). Patient endorses having suicidal thoughts for the past week and she confirms that today she locked herself in the bathroom with a knife and started to cut her wrist. Client also reported overdosing on 1400mg  of her heart medications a week ago. Patient is admitted to [facility] unit with Q15 min safety monitoring. Multidisciplinary team approach is offered. Medication management; group/milieu therapy is offered.  Continued Clinical Symptoms:    The Alcohol  Use Disorders Identification Test, Guidelines for Use in Primary Care, Second Edition.  World Science Writer [facility]). Score between 0-7:  no or low risk or alcohol  related problems. Score between 8-15:  moderate risk of alcohol  related problems. Score between 16-19:  high risk of alcohol  related problems. Score 20 or above:  warrants further diagnostic evaluation for alcohol  dependence and treatment.   CLINICAL FACTORS:   Depression:   Impulsivity   Musculoskeletal: Strength & Muscle Tone: within normal limits Gait &  Station: normal Patient leans: N/A  Psychiatric Specialty Exam:  Presentation  General Appearance:  Appropriate for Environment; Casual  Eye Contact: Minimal  Speech: Normal Rate  Speech Volume: Decreased  Handedness: Right   Mood and Affect  Mood: Anxious; Depressed  Affect: Depressed; Flat   Thought Process  Thought Processes: Coherent  Descriptions of Associations:Intact  Orientation:Full (Time, Place and Person)  Thought Content:Logical  History of Schizophrenia/Schizoaffective disorder:No  Duration of Psychotic Symptoms:No data recorded Hallucinations:Hallucinations: None  Ideas of Reference:None  Suicidal Thoughts:Suicidal Thoughts: Yes, Active SI Active Intent and/or Plan: With Intent  Homicidal Thoughts:Homicidal Thoughts: No   Sensorium  Memory: Immediate Fair; Remote Fair  Judgment: Impaired  Insight: Shallow   Executive Functions  Concentration: Fair  Attention Span: Fair  Recall: [employer] of Knowledge: Fair  Language: Fair   Psychomotor Activity  Psychomotor Activity: Psychomotor Activity: Normal   Assets  Assets: Manufacturing Systems Engineer; Desire for Improvement   Sleep  Sleep: Sleep: Poor Number of Hours of Sleep: 5    Physical Exam: Physical Exam ROS Blood pressure 134/89, pulse 88, temperature (!) 97 F (36.1 C), resp. rate 14, height 5' 2 (1.575 m), weight 51.3 kg, SpO2 99%. Body mass index is 20.67 kg/m.   COGNITIVE FEATURES THAT CONTRIBUTE TO RISK:  None    SUICIDE RISK:   Moderate:  Frequent suicidal ideation with limited intensity, and duration, some specificity in terms of plans, no associated intent, good self-control, limited dysphoria/symptomatology, some risk factors present, and identifiable  protective factors, including available and accessible social support.  PLAN OF CARE: Patient is admitted to [facility] psych unit with Q15 min safety monitoring. Multidisciplinary team approach is  offered. Medication management; group/milieu therapy is offered.   I certify that inpatient services furnished can reasonably be expected to improve the patient's condition.   Allyn Foil, MD 11/08/2024, 10:13 PM

## 2024-11-08 NOTE — ED Notes (Signed)
 Report called to Darice, RN @ [facility]. Safe transport notified of need for transfer

## 2024-11-08 NOTE — Plan of Care (Signed)
   Problem: Education: Goal: Ability to state activities that reduce stress will improve Outcome: Progressing   Problem: Coping: Goal: Ability to identify and develop effective coping behavior will improve Outcome: Progressing

## 2024-11-08 NOTE — Progress Notes (Signed)
   11/08/24 0500  Psych Admission Type (Psych Patients Only)  Admission Status Voluntary  Psychosocial Assessment  Patient Complaints Crying spells;Sadness;Depression  Eye Contact Fair  Facial Expression Sad  Affect Depressed;Sad  Speech Logical/coherent;Soft  Interaction Minimal  Motor Activity Slow  Appearance/Hygiene In scrubs  Behavior Characteristics Cooperative;Appropriate to situation;Calm  Mood Depressed;Sad  Thought Process  Coherency WDL  Content WDL  Delusions None reported or observed  Perception WDL  Hallucination None reported or observed  Judgment WDL  Confusion None  Danger to Self  Current suicidal ideation? Denies  Self-Injurious Behavior No self-injurious ideation or behavior indicators observed or expressed   Agreement Not to Harm Self Yes  Description of Agreement Verbalized  Danger to Others  Danger to Others None reported or observed

## 2024-11-08 NOTE — Group Note (Signed)
 Date:  11/08/2024 Time:  11:01 AM  Group Topic/Focus:  Building Self Esteem:   The Focus of this group is helping patients become aware of the effects of self-esteem on their lives, the things they and others do that enhance or undermine their self-esteem, seeing the relationship between their level of self-esteem and the choices they make and learning ways to enhance self-esteem.    Participation Level:  Did Not Attend   [name] Hinton 11/08/2024, 11:01 AM

## 2024-11-08 NOTE — Group Note (Signed)
 Date:  11/08/2024 Time:  9:22 PM  Group Topic/Focus:  Wrap-Up Group:   The focus of this group is to help patients review their daily goal of treatment and discuss progress on daily workbooks.    Participation Level:  Active  Participation Quality:  Appropriate  Affect:  Appropriate  Cognitive:  Appropriate  Insight: Appropriate  Engagement in Group:  Engaged  Modes of Intervention:  Discussion  Additional Comments:    Tommas CHRISTELLA Bunker 11/08/2024, 9:22 PM

## 2024-11-08 NOTE — Progress Notes (Signed)
   11/08/24 1700  Psych Admission Type (Psych Patients Only)  Admission Status Voluntary  Psychosocial Assessment  Patient Complaints Depression  Eye Contact Fair  Facial Expression Sad  Affect Depressed;Sad  Speech Logical/coherent  Interaction Minimal  Motor Activity Slow  Appearance/Hygiene In scrubs  Behavior Characteristics Cooperative;Appropriate to situation  Mood Depressed;Sad  Thought Process  Coherency WDL  Content WDL  Delusions None reported or observed  Perception WDL  Hallucination None reported or observed  Judgment WDL  Confusion None  Danger to Self  Current suicidal ideation? Denies  Self-Injurious Behavior No self-injurious ideation or behavior indicators observed or expressed   Agreement Not to Harm Self Yes  Description of Agreement Verbalized  Danger to Others  Danger to Others None reported or observed

## 2024-11-08 NOTE — Progress Notes (Signed)
 Report was called @ 2:54a.  The patient arrived @ 4:04a via safe transport & ambulated to this unit without difficulty.  She is alert/oriented X 4.  She was without s/sx. of acute distress.  She denied SI, HI, delusions, & hallucinations.  She is pleasant and able to answer all questions appropriately.  She reported that she attempted to cut her wrist, but it hurt to bad.  She has 3 scratches on her left wrist.  See skin assessment for complete details.  She reported falling Saturday on the railing of her bed.  She has discomfort 1/10 on her right rib when she is not moving around.  The pain increases to 6/10 when she moving around.  She stated that she occasionally drinks ETOH & Cocaine.  Last use of Cocaine was this past Saturday 11-05-2024.    Initiated Plan of Care and 61m Safety check started as per MD order.  Currently she is resting in bed with closed eyes.

## 2024-11-08 NOTE — Group Note (Signed)

## 2024-11-08 NOTE — Group Note (Signed)
 Recreation Therapy Group Note   Group Topic:Communication  Group Date: 11/08/2024 Start Time: 1415 End Time: 1500 Facilitators: Celestia Jeoffrey BRAVO, LRT, CTRS Location: Dayroom  Group Description: Name That Food. Pt will randomly select 1 cut paper from laminated stack of popular food images from the 1970's-1990's from LRT. Without showing the group the image they selected, pt will use descriptive words to explain what food they selected. Once the image is correctly guessed, LRT and pts will reminisce and discuss past fond memories associated with the food as well as the importance of communication.  Goal Area(s) Addressed: Patient will increase communication skills.  Patient will increase frustration tolerance skills. Patient will reminisce a fond memory in their life.     Affect/Mood: N/A   Participation Level: Did not attend    Clinical Observations/Individualized Feedback: Patient did not attend group.   Plan: Continue to engage patient in RT group sessions 2-3x/week.   Jeoffrey BRAVO Celestia, LRT, CTRS 11/08/2024 4:27 PM

## 2024-11-08 NOTE — H&P (Signed)
 Psychiatric Admission Assessment Adult  Patient Identification: [name] Hinton MRN:  [id number] Date of Evaluation:  11/08/2024 Chief Complaint:  Bipolar 1 disorder (HCC) [F31.9]   History of Present Illness: Patient is a 63 year old female with a history of Bipolar Disorder who presents voluntarily to [facility] for assessment. Patient presents via [facility] via [employer] with chief complaint of suicide attempt. [employer] was contacted by patient's mother after patient place a note on the bathroom door stating: Mom do not open this door!  Just call 911, and let them come in here.  You do not need to see me.  I am sorry!  This is what you need to tell the 911 operator: Tell them I told you not to come in.  I have slit my wrist and bled out.  I am dead (I hope!).    Patient endorses having suicidal thoughts for the past week and she confirms that today she locked herself in the bathroom with a knife and started to cut her wrist. Client also reported overdosing on 1400mg  of her heart medications a week ago.Patient is admitted to [facility] unit with Q15 min safety monitoring. Multidisciplinary team approach is offered. Medication management; group/milieu therapy is offered.   Total Time spent with patient: 1 hour Sleep  Sleep:Sleep: Poor Number of Hours of Sleep: 5  Past Psychiatric History:  Psychiatric History:  Information collected from patient/chart  Prev Dx/Sx: *** Current Psych Provider: *** Home Meds (current): *** Previous Med Trials: *** Therapy: ***  Prior Psych Hospitalization: ***  Prior Self Harm: *** Prior Violence: ***  Family Psych History: *** Family Hx suicide: ***  Social History:  Developmental Hx: *** Educational Hx: *** Occupational Hx: *** Legal Hx: *** Living Situation: *** Spiritual Hx: *** Access to weapons/lethal means: ***   Substance History Alcohol : ***  Type of alcohol  *** Last Drink *** Number of drinks per day *** History of alcohol   withdrawal seizures *** History of DT's *** Tobacco: *** Illicit drugs: *** Prescription drug abuse: *** Rehab hx: *** Is the patient at risk to self? {yes no:314532}  Has the patient been a risk to self in the past 6 months? {yes no:314532}  Has the patient been a risk to self within the distant past? {yes no:314532}  Is the patient a risk to others? {yes no:314532}  Has the patient been a risk to others in the past 6 months? {yes no:314532}  Has the patient been a risk to others within the distant past? {yes no:314532}   Columbia Scale:  Flowsheet Row Admission (Current) from 11/08/2024 in [facility] ED from 11/07/2024 in [facility] OP [facility] from 10[facility]  C-SSRS RISK CATEGORY High Risk High Risk Low Risk     Past Medical History:  Past Medical History:  Diagnosis Date   Anxiety    Bipolar affective (HCC)    Chronic headaches    Depression    Hypercholesteremia    Hypertension    Restless leg syndrome     Past Surgical History:  Procedure Laterality Date   ABDOMINAL HYSTERECTOMY     OOPHORECTOMY     TONSILLECTOMY     Family History:  Family History  Problem Relation Age of Onset   Breast cancer Neg Hx     Social History:  Social History   Substance and Sexual Activity  Alcohol  Use No     Social History   Substance  and Sexual Activity  Drug Use No      Allergies:   Allergies  Allergen Reactions   Quetiapine  Other (See Comments)    Causes a lot of lethargy the following morning   Vancomycin  Itching and Other (See Comments)    All-over, severe itching   Lab Results:  Results for orders placed or performed during the hospital encounter of 11/07/24 (from the past 48 hours)  CBC with Differential/Platelet     Status: None   Collection Time: 11/07/24  6:20 PM  Result Value Ref Range   WBC 9.4 4.0 - 10.5 K/uL   RBC 4.21 3.87 - 5.11 MIL/uL   Hemoglobin  12.9 12.0 - 15.0 g/dL   HCT 61.4 63.9 - 53.9 %   MCV 91.4 80.0 - 100.0 fL   MCH 30.6 26.0 - 34.0 pg   MCHC 33.5 30.0 - 36.0 g/dL   RDW 87.3 88.4 - 84.4 %   Platelets 384 150 - 400 K/uL   nRBC 0.0 0.0 - 0.2 %   Neutrophils Relative % 71 %   Neutro Abs 6.9 1.7 - 7.7 K/uL   Lymphocytes Relative 15 %   Lymphs Abs 1.4 0.7 - 4.0 K/uL   Monocytes Relative 8 %   Monocytes Absolute 0.8 0.1 - 1.0 K/uL   Eosinophils Relative 4 %   Eosinophils Absolute 0.3 0.0 - 0.5 K/uL   Basophils Relative 1 %   Basophils Absolute 0.1 0.0 - 0.1 K/uL   Immature Granulocytes 1 %   Abs Immature Granulocytes 0.05 0.00 - 0.07 K/uL    Comment: Performed at [facility], [address], [place], [place] [address]  Comprehensive metabolic panel     Status: Abnormal   Collection Time: 11/07/24  6:20 PM  Result Value Ref Range   Sodium 143 135 - 145 mmol/L   Potassium 3.3 (L) 3.5 - 5.1 mmol/L   Chloride 105 98 - 111 mmol/L   CO2 32 22 - 32 mmol/L   Glucose, Bld 101 (H) 70 - 99 mg/dL    Comment: Glucose reference range applies only to samples taken after fasting for at least 8 hours.   BUN 26 (H) 8 - 23 mg/dL   Creatinine, Ser 9.04 0.44 - 1.00 mg/dL   Calcium  9.4 8.9 - 10.3 mg/dL   Total Protein 6.8 6.5 - 8.1 g/dL   Albumin 3.7 3.5 - 5.0 g/dL   AST 22 15 - 41 U/L   ALT 17 0 - 44 U/L   Alkaline Phosphatase 86 38 - 126 U/L   Total Bilirubin 0.5 0.0 - 1.2 mg/dL   GFR, Estimated >39 >39 mL/min    Comment: (NOTE) Calculated using the CKD-EPI Creatinine Equation (2021)    Anion gap 6 5 - 15    Comment: Performed at [facility], [address], [place], [place] [address]  Magnesium      Status: None   Collection Time: 11/07/24  6:20 PM  Result Value Ref Range   Magnesium  1.9 1.7 - 2.4 mg/dL    Comment: Performed at [facility], [address], [place], [place] [address]  Ethanol     Status: None   Collection Time: 11/07/24  6:20 PM  Result Value Ref Range   Alcohol , Ethyl (B) <15 <15 mg/dL     Comment: (NOTE) For medical purposes only. Performed at [facility], [address] [place], [place] [address]   TSH     Status: None  Collection Time: 11/07/24  6:20 PM  Result Value Ref Range   TSH 0.976 0.350 - 4.500 uIU/mL    Comment: Performed by a 3rd Generation assay with a functional sensitivity of <=0.01 uIU/mL. Performed at [facility], [address], [place], [place] [address]   POCT Urine Drug Screen - (I-Screen)     Status: Abnormal   Collection Time: 11/07/24  6:33 PM  Result Value Ref Range   POC Amphetamine UR None Detected NONE DETECTED (Cut Off Level 1000 ng/mL)   POC Secobarbital (BAR) None Detected NONE DETECTED (Cut Off Level 300 ng/mL)   POC Buprenorphine (BUP) None Detected NONE DETECTED (Cut Off Level 10 ng/mL)   POC Oxazepam (BZO) None Detected NONE DETECTED (Cut Off Level 300 ng/mL)   POC Cocaine UR Positive (A) NONE DETECTED (Cut Off Level 300 ng/mL)   POC Methamphetamine UR None Detected NONE DETECTED (Cut Off Level 1000 ng/mL)   POC Morphine None Detected NONE DETECTED (Cut Off Level 300 ng/mL)   POC Methadone UR None Detected NONE DETECTED (Cut Off Level 300 ng/mL)   POC Oxycodone  UR None Detected NONE DETECTED (Cut Off Level 100 ng/mL)   POC Marijuana UR None Detected NONE DETECTED (Cut Off Level 50 ng/mL)  POC SARS Coronavirus 2 Ag     Status: None   Collection Time: 11/07/24  7:02 PM  Result Value Ref Range   SARSCOV2ONAVIRUS 2 AG NEGATIVE NEGATIVE    Comment: (NOTE) SARS-CoV-2 antigen NOT DETECTED.   Negative results are presumptive.  Negative results do not preclude SARS-CoV-2 infection and should not be used as the sole basis for treatment or other patient management decisions, including infection  control decisions, particularly in the presence of clinical signs and  symptoms consistent with COVID-19, or in those who have been in contact with the virus.  Negative results must be combined with clinical observations,  patient history, and epidemiological information. The expected result is Negative.  Fact Sheet for Patients: https://www.jennings-kim.com/  Fact Sheet for Healthcare Providers: https://alexander-rogers.biz/  This test is not yet approved or cleared by the [place]  FDA and  has been authorized for detection and/or diagnosis of SARS-CoV-2 by FDA under an Emergency Use Authorization (EUA).  This EUA will remain in effect (meaning this test can be used) for the duration of  the COV ID-19 declaration under Section 564(b)(1) of the Act, 21 U.S.C. section 360bbb-3(b)(1), unless the authorization is terminated or revoked sooner.    SARS Coronavirus 2 by RT PCR (hospital order, performed in [facility] hospital lab) *cepheid single result test* Anterior Nasal Swab     Status: None   Collection Time: 11/07/24 10:08 PM   Specimen: Anterior Nasal Swab  Result Value Ref Range   SARS Coronavirus 2 by RT PCR NEGATIVE NEGATIVE    Comment: Performed at [facility], [address], [place], [place] [address]    Blood Alcohol  level:  Lab Results  Component Value Date   [facility] <15 11/07/2024    Metabolic Disorder Labs:  No results found for: HGBA1C, MPG No results found for: PROLACTIN Lab Results  Component Value Date   CHOL 174 03/07/2024   TRIG 62 03/07/2024   HDL 62 03/07/2024   CHOLHDL 2.8 03/07/2024   VLDL 28 11/18/2007   LDLCALC 100 (H) 03/07/2024   LDLCALC (H) 11/18/2007    183        Total Cholesterol/HDL:CHD Risk Coronary Heart Disease Risk Table  Men   Women  1/2 Average Risk   3.4   3.3    Current Medications: Current Facility-Administered Medications  Medication Dose Route Frequency Provider Last Rate Last Admin   acetaminophen  (TYLENOL ) tablet 650 mg  650 mg Oral Q6H PRN McLauchlin, Angela, NP   650 mg at 11/08/24 1736   alum & mag hydroxide-simeth (MAALOX/MYLANTA) 200-200-20 MG/5ML suspension 30 mL  30 mL Oral  Q4H PRN McLauchlin, Angela, NP       artificial tears ophthalmic solution 1 drop  1 drop Both Eyes TID PRN Tushar Enns, MD       aspirin  EC tablet 81 mg  81 mg Oral QHS Rae Plotner, MD   81 mg at 11/08/24 2122   cariprazine  (VRAYLAR ) capsule 1.5 mg  1.5 mg Oral Daily Terrance Lanahan, MD   1.5 mg at 11/08/24 1243   haloperidol  (HALDOL ) tablet 5 mg  5 mg Oral TID PRN McLauchlin, Angela, NP       And   diphenhydrAMINE  (BENADRYL ) capsule 50 mg  50 mg Oral TID PRN McLauchlin, Angela, NP       haloperidol  lactate (HALDOL ) injection 5 mg  5 mg Intramuscular TID PRN McLauchlin, Angela, NP       And   diphenhydrAMINE  (BENADRYL ) injection 50 mg  50 mg Intramuscular TID PRN McLauchlin, Angela, NP       And   LORazepam  (ATIVAN ) injection 2 mg  2 mg Intramuscular TID PRN McLauchlin, Angela, NP       haloperidol  lactate (HALDOL ) injection 10 mg  10 mg Intramuscular TID PRN McLauchlin, Jon, NP       And   diphenhydrAMINE  (BENADRYL ) injection 50 mg  50 mg Intramuscular TID PRN McLauchlin, Angela, NP       And   LORazepam  (ATIVAN ) injection 2 mg  2 mg Intramuscular TID PRN McLauchlin, Angela, NP       gabapentin  (NEURONTIN ) capsule 300 mg  300 mg Oral TID Ichael Pullara, MD   300 mg at 11/08/24 2122   spironolactone  (ALDACTONE ) tablet 25 mg  25 mg Oral Daily Greenwood, Howard F, RPH   25 mg at 11/08/24 1243   And   hydrochlorothiazide  (HYDRODIURIL ) tablet 25 mg  25 mg Oral Daily Greenwood, Howard F, RPH   25 mg at 11/08/24 1242   hydrOXYzine  (ATARAX ) tablet 25 mg  25 mg Oral TID PRN McLauchlin, Angela, NP       irbesartan  (AVAPRO ) tablet 37.5 mg  37.5 mg Oral Daily Greer Wainright, MD   37.5 mg at 11/08/24 1243   magnesium  hydroxide (MILK OF MAGNESIA) suspension 30 mL  30 mL Oral Daily PRN McLauchlin, Angela, NP       metoprolol  tartrate (LOPRESSOR ) tablet 25 mg  25 mg Oral BID PRN Brenya Taulbee, MD       multivitamin with minerals tablet 1 tablet  1 tablet Oral q morning Shiryl Ruddy, MD    1 tablet at 11/08/24 1242   naproxen  (NAPROSYN ) tablet 250 mg  250 mg Oral BID PRN Candus Braud, MD       pantoprazole  (PROTONIX ) EC tablet 40 mg  40 mg Oral Daily PRN Jerni Selmer, MD       rOPINIRole  (REQUIP ) tablet 1 mg  1 mg Oral QHS Georgi Tuel, MD   1 mg at 11/08/24 2123   simvastatin  (ZOCOR ) tablet 20 mg  20 mg Oral QHS Chace Klippel, MD   20 mg at 11/08/24 2122   traZODone  (DESYREL ) tablet 50 mg  50  mg Oral QHS PRN McLauchlin, Angela, NP   50 mg at 11/08/24 2122   PTA Medications: Medications Prior to Admission  Medication Sig Dispense Refill Last Dose/Taking   ARTIFICIAL TEARS PF 0.1-0.3 % SOLN Place 1 drop into both eyes 3 (three) times daily as needed (for dryness).      aspirin  EC 81 MG tablet Take 81 mg by mouth at bedtime. Swallow whole.      Aspirin -Acetaminophen -Caffeine (GOODY HEADACHE PO) Take 1 packet by mouth as needed (for headaches).      buPROPion  (WELLBUTRIN  XL) 150 MG 24 hr tablet Take 150 mg by mouth every morning.      Calcium  Carb-Cholecalciferol 600-5 MG-MCG TABS Take 1 tablet by mouth daily.      clonazePAM  (KLONOPIN ) 0.5 MG tablet Take 0.5 mg by mouth 2 (two) times daily as needed for anxiety.      gabapentin  (NEURONTIN ) 300 MG capsule TAKE 1 CAPSULE BY MOUTH THREE TIMES A DAY 270 capsule 2    ibuprofen (ADVIL) 200 MG tablet Take 800 mg by mouth every 6 (six) hours as needed for headache or mild pain.      metoprolol  tartrate (LOPRESSOR ) 50 MG tablet TAKE 1 TABLET BY MOUTH 2 TIMES DAILY AS NEEDED (MAY TAKE 50MG  1-2 TIMES DAILY FOR IRREGULAR HEART BEAT). 180 tablet 1    Multiple Vitamin (MULTIVITAMIN WITH MINERALS) TABS Take 1 tablet by mouth every morning.      naproxen  sodium (ALEVE ) 220 MG tablet Take 220-440 mg by mouth 2 (two) times daily as needed (for mild pain or headaches).      omeprazole (PRILOSEC OTC) 20 MG tablet Take 20 mg by mouth daily as needed (for reflux).      rOPINIRole  (REQUIP ) 1 MG tablet Take 1 mg by mouth at bedtime.       simvastatin  (ZOCOR ) 20 MG tablet Take 20 mg by mouth at bedtime.      spironolactone -hydrochlorothiazide  (ALDACTAZIDE) 25-25 MG tablet Take 1 tablet by mouth daily. 30 tablet 11    TYLENOL  8 HOUR 650 MG CR tablet Take 650-1,300 mg by mouth every 8 (eight) hours as needed for pain (or headaches).      valsartan (DIOVAN) 320 MG tablet Take 320 mg by mouth daily.      VRAYLAR  1.5 MG capsule Take 1.5 mg by mouth daily.       Psychiatric Specialty Exam:  Presentation  General Appearance:  Appropriate for Environment; Casual  Eye Contact: Minimal  Speech: Normal Rate  Speech Volume: Decreased    Mood and Affect  Mood: Anxious; Depressed  Affect: Depressed; Flat   Thought Process  Thought Processes: Coherent  Descriptions of Associations:Intact  Orientation:Full (Time, Place and Person)  Thought Content:Logical  Hallucinations:Hallucinations: None  Ideas of Reference:None  Suicidal Thoughts:Suicidal Thoughts: Yes, Active SI Active Intent and/or Plan: With Intent  Homicidal Thoughts:Homicidal Thoughts: No   Sensorium  Memory: Immediate Fair; Remote Fair  Judgment: Impaired  Insight: Shallow   Executive Functions  Concentration: Fair  Attention Span: Fair  Recall: [employer] of Knowledge: Fair  Language: Fair   Psychomotor Activity  Psychomotor Activity: Psychomotor Activity: Normal   Assets  Assets: Manufacturing Systems Engineer; Desire for Improvement    Musculoskeletal: Strength & Muscle Tone: {desc; muscle tone:32375} Gait & Station: {PE GAIT ED WJUO:77474}  Physical Exam: Physical Exam ROS Blood pressure 134/89, pulse 88, temperature (!) 97 F (36.1 C), resp. rate 14, height 5' 2 (1.575 m), weight 51.3 kg, SpO2 99%. Body mass index  is 20.67 kg/m.  Principal Diagnosis: Bipolar 1 disorder (HCC) Diagnosis:  Principal Problem:   Bipolar 1 disorder (HCC)   Clinical Decision Making:  Treatment Plan Summary:  Safety and  Monitoring:             -- Voluntary admission to inpatient psychiatric unit for safety, stabilization and treatment             -- Daily contact with patient to assess and evaluate symptoms and progress in treatment             -- Patient's case to be discussed in multi-disciplinary team meeting             -- Observation Level: q15 minute checks             -- Vital signs:  q12 hours             -- Precautions: suicide, elopement, and assault   2. Psychiatric Diagnoses and Treatment:                   -- The risks/benefits/side-effects/alternatives to this medication were discussed in detail with the patient and time was given for questions. The patient consents to medication trial.                -- Metabolic profile and EKG monitoring obtained while on an atypical antipsychotic (BMI: Lipid Panel: HbgA1c: QTc:)              -- Encouraged patient to participate in unit milieu and in scheduled group therapies                            3. Medical Issues Being Addressed:      4. Discharge Planning:              -- Social work and case management to assist with discharge planning and identification of hospital follow-up needs prior to discharge             -- Estimated LOS: 5-7 days             -- Discharge Concerns: Need to establish a safety plan; Medication compliance and effectiveness             -- Discharge Goals: Return home with outpatient referrals follow ups  Physician Treatment Plan for Primary Diagnosis: Bipolar 1 disorder (HCC) Long Term Goal(s): {[facility] MD Tx Plan Long Term Goals:[id number]::Improvement in symptoms so as ready for discharge}  Short Term Goals: [facility] MD Tx Plan Short Term Hnjod:[id number]}  Physician Treatment Plan for Secondary Diagnosis: Principal Problem:   Bipolar 1 disorder (HCC)  Long Term Goal(s): Improvement in symptoms so as ready for discharge  Short Term Goals: Ability to identify changes in lifestyle to reduce recurrence of condition will  improve, Ability to verbalize feelings will improve, Ability to disclose and discuss suicidal ideas, Ability to demonstrate self-control will improve, and Ability to identify and develop effective coping behaviors will improve  I certify that inpatient services furnished can reasonably be expected to improve the patient's condition.    Kawanda Drumheller, MD 12/9/202510:20 PM

## 2024-11-09 ENCOUNTER — Encounter: Payer: Self-pay | Admitting: Psychiatry

## 2024-11-09 ENCOUNTER — Inpatient Hospital Stay

## 2024-11-09 DIAGNOSIS — F319 Bipolar disorder, unspecified: Secondary | ICD-10-CM | POA: Diagnosis not present

## 2024-11-09 LAB — PROLACTIN: Prolactin: 8.7 ng/mL (ref 3.6–25.2)

## 2024-11-09 MED ORDER — VENLAFAXINE HCL ER 37.5 MG PO CP24
37.5000 mg | ORAL_CAPSULE | Freq: Every day | ORAL | Status: DC
  Administered 2024-11-10 – 2024-11-11 (×2): 37.5 mg via ORAL
  Filled 2024-11-09 (×3): qty 1

## 2024-11-09 MED ORDER — NICOTINE 14 MG/24HR TD PT24
14.0000 mg | MEDICATED_PATCH | Freq: Every day | TRANSDERMAL | Status: DC
  Administered 2024-11-10 – 2024-11-28 (×19): 14 mg via TRANSDERMAL
  Filled 2024-11-09 (×18): qty 1

## 2024-11-09 MED ORDER — CLONAZEPAM 0.5 MG PO TABS
0.5000 mg | ORAL_TABLET | Freq: Every day | ORAL | Status: DC | PRN
  Administered 2024-11-13 – 2024-11-15 (×3): 0.5 mg via ORAL
  Filled 2024-11-09 (×3): qty 1

## 2024-11-09 MED ORDER — CLONAZEPAM 0.5 MG PO TABS
0.5000 mg | ORAL_TABLET | Freq: Every day | ORAL | Status: DC
  Administered 2024-11-09 – 2024-11-14 (×6): 0.5 mg via ORAL
  Filled 2024-11-09 (×6): qty 1

## 2024-11-09 NOTE — Progress Notes (Signed)
 SI/HI/AVH: denies all  Behavior/Mood:  cooperative/depressed  Interaction/Group attendance: assertive/ 2 of 2 groups   Medication/PRNs: compliant/Maalox and Tylenol    Pain: 3/10 rib cage  Other: Patient had xray of right rib cage for complaints of pain from a fall sustained prior to admission.

## 2024-11-09 NOTE — Group Note (Signed)
 Date:  11/09/2024 Time:  10:26 AM  Group Topic/Focus:  Morning Stretch with Norleen, Movement therapy    Participation Level:  Active  Participation Quality:  Appropriate  Affect:  Appropriate  Cognitive:  Appropriate  Insight: Appropriate  Engagement in Group:  Engaged  Modes of Intervention:  Activity  Additional Comments:  none  Norleen SHAUNNA Bias 11/09/2024, 10:26 AM

## 2024-11-09 NOTE — Progress Notes (Addendum)
 Estimated Sleeping Duration (Last 24 Hours): 9.00-10.25 hours     11/08/24 2244  Psych Admission Type (Psych Patients Only)  Admission Status Voluntary  Psychosocial Assessment  Patient Complaints Depression  Eye Contact Fair  Facial Expression Sad  Affect Sad;Depressed  Speech Logical/coherent  Interaction Minimal  Motor Activity Slow  Appearance/Hygiene In scrubs  Behavior Characteristics Cooperative;Appropriate to situation  Mood Depressed  Thought Process  Coherency WDL  Content WDL  Delusions None reported or observed  Perception WDL  Hallucination None reported or observed  Judgment WDL  Confusion None  Danger to Self  Current suicidal ideation? Denies  Description of Suicide Plan denies  Self-Injurious Behavior No self-injurious ideation or behavior indicators observed or expressed   Agreement Not to Harm Self Yes  Description of Agreement verbal  Danger to Others  Danger to Others None reported or observed

## 2024-11-09 NOTE — Plan of Care (Signed)
°  Problem: Coping: Goal: Ability to identify and develop effective coping behavior will improve Outcome: Progressing   Problem: Self-Concept: Goal: Level of anxiety will decrease Outcome: Progressing   Problem: Education: Goal: Mental status will improve Outcome: Progressing   Problem: Coping: Goal: Ability to verbalize frustrations and anger appropriately will improve Outcome: Progressing

## 2024-11-09 NOTE — Group Note (Signed)
 Date:  11/09/2024 Time:  9:23 PM  Group Topic/Focus:  Self Care:   The focus of this group is to help patients understand the importance of self-care in order to improve or restore emotional, physical, spiritual, interpersonal, and financial health.    Participation Level:  Active  Participation Quality:  Appropriate  Affect:  Appropriate  Cognitive:  Appropriate  Insight: Appropriate  Engagement in Group:  Engaged  Modes of Intervention:  Education  Additional Comments:    [name] Hinton 11/09/2024, 9:23 PM

## 2024-11-09 NOTE — Progress Notes (Signed)
°   11/09/24 2200  Psych Admission Type (Psych Patients Only)  Admission Status Voluntary  Psychosocial Assessment  Patient Complaints Depression  Eye Contact Fair  Facial Expression Flat  Affect Sad  Speech Logical/coherent  Interaction Assertive  Motor Activity Slow  Appearance/Hygiene In scrubs  Behavior Characteristics Cooperative;Appropriate to situation  Mood Depressed  Thought Process  Coherency WDL  Content WDL  Delusions None reported or observed  Perception WDL  Hallucination None reported or observed  Judgment WDL  Confusion None  Danger to Self  Current suicidal ideation? Denies  Self-Injurious Behavior No self-injurious ideation or behavior indicators observed or expressed   Agreement Not to Harm Self Yes  Description of Agreement verbal  Danger to Others  Danger to Others None reported or observed   Mood/Behavior:  Pleasant and cooperative.    Psych assessment: Denies SI/HI and AVH.    Interaction / Group attendance:  Present in the milieu. Minimal interaction with peers and staff.  Attended group.   Medication/ PRNs: Compliant with scheduled medications. Required PRNs Trazodone  for sleep and noted effective. Required prn Tylenol  for pain.    Pain: Rib cage  15 min checks in place for safety.

## 2024-11-09 NOTE — Plan of Care (Signed)
  Problem: Self-Concept: Goal: Ability to identify factors that promote anxiety will improve Outcome: Progressing Goal: Level of anxiety will decrease Outcome: Progressing

## 2024-11-09 NOTE — Plan of Care (Signed)
  Problem: Education: Goal: Ability to state activities that reduce stress will improve Outcome: Progressing   Problem: Coping: Goal: Ability to identify and develop effective coping behavior will improve Outcome: Progressing   Problem: Self-Concept: Goal: Ability to identify factors that promote anxiety will improve Outcome: Progressing Goal: Level of anxiety will decrease Outcome: Progressing Goal: Ability to modify response to factors that promote anxiety will improve Outcome: Progressing   Problem: Education: Goal: Knowledge of [facility] Education information/materials will improve Outcome: Progressing Goal: Emotional status will improve Outcome: Progressing Goal: Mental status will improve Outcome: Progressing Goal: Verbalization of understanding the information provided will improve Outcome: Progressing   Problem: Activity: Goal: Interest or engagement in activities will improve Outcome: Progressing Goal: Sleeping patterns will improve Outcome: Progressing   Problem: Coping: Goal: Ability to verbalize frustrations and anger appropriately will improve Outcome: Progressing Goal: Ability to demonstrate self-control will improve Outcome: Progressing   Problem: Health Behavior/Discharge Planning: Goal: Identification of resources available to assist in meeting health care needs will improve Outcome: Progressing Goal: Compliance with treatment plan for underlying cause of condition will improve Outcome: Progressing   Problem: Physical Regulation: Goal: Ability to maintain clinical measurements within normal limits will improve Outcome: Progressing   Problem: Safety: Goal: Periods of time without injury will increase Outcome: Progressing   Problem: Education: Goal: Utilization of techniques to improve thought processes will improve Outcome: Progressing Goal: Knowledge of the prescribed therapeutic regimen will improve Outcome: Progressing   Problem:  Activity: Goal: Interest or engagement in leisure activities will improve Outcome: Progressing Goal: Imbalance in normal sleep/wake cycle will improve Outcome: Progressing   Problem: Coping: Goal: Coping ability will improve Outcome: Progressing Goal: Will verbalize feelings Outcome: Progressing   Problem: Health Behavior/Discharge Planning: Goal: Ability to make decisions will improve Outcome: Progressing Goal: Compliance with therapeutic regimen will improve Outcome: Progressing   Problem: Role Relationship: Goal: Will demonstrate positive changes in social behaviors and relationships Outcome: Progressing   Problem: Safety: Goal: Ability to disclose and discuss suicidal ideas will improve Outcome: Progressing Goal: Ability to identify and utilize support systems that promote safety will improve Outcome: Progressing   Problem: Self-Concept: Goal: Will verbalize positive feelings about self Outcome: Progressing Goal: Level of anxiety will decrease Outcome: Progressing   

## 2024-11-09 NOTE — BH IP Treatment Plan (Signed)
 Interdisciplinary Treatment and Diagnostic Plan Update  11/09/2024 Time of Session: 10:25 AM  [name] Hinton MRN: [id number]  Principal Diagnosis: Bipolar 1 disorder (HCC)  Secondary Diagnoses: Principal Problem:   Bipolar 1 disorder (HCC)   Current Medications:  Current Facility-Administered Medications  Medication Dose Route Frequency Provider Last Rate Last Admin   acetaminophen  (TYLENOL ) tablet 650 mg  650 mg Oral Q6H PRN McLauchlin, Jon, NP   650 mg at 11/09/24 0813   alum & mag hydroxide-simeth (MAALOX/MYLANTA) 200-200-20 MG/5ML suspension 30 mL  30 mL Oral Q4H PRN McLauchlin, Angela, NP       artificial tears ophthalmic solution 1 drop  1 drop Both Eyes TID PRN Jadapalle, Sree, MD       aspirin  EC tablet 81 mg  81 mg Oral QHS Jadapalle, Sree, MD   81 mg at 11/08/24 2122   cariprazine  (VRAYLAR ) capsule 1.5 mg  1.5 mg Oral Daily Jadapalle, Sree, MD   1.5 mg at 11/09/24 9051   haloperidol  (HALDOL ) tablet 5 mg  5 mg Oral TID PRN McLauchlin, Angela, NP       And   diphenhydrAMINE  (BENADRYL ) capsule 50 mg  50 mg Oral TID PRN McLauchlin, Jon, NP       haloperidol  lactate (HALDOL ) injection 5 mg  5 mg Intramuscular TID PRN McLauchlin, Jon, NP       And   diphenhydrAMINE  (BENADRYL ) injection 50 mg  50 mg Intramuscular TID PRN McLauchlin, Jon, NP       And   LORazepam  (ATIVAN ) injection 2 mg  2 mg Intramuscular TID PRN McLauchlin, Jon, NP       haloperidol  lactate (HALDOL ) injection 10 mg  10 mg Intramuscular TID PRN McLauchlin, Jon, NP       And   diphenhydrAMINE  (BENADRYL ) injection 50 mg  50 mg Intramuscular TID PRN McLauchlin, Angela, NP       And   LORazepam  (ATIVAN ) injection 2 mg  2 mg Intramuscular TID PRN McLauchlin, Angela, NP       gabapentin  (NEURONTIN ) capsule 300 mg  300 mg Oral TID Jadapalle, Sree, MD   300 mg at 11/09/24 0948   spironolactone  (ALDACTONE ) tablet 25 mg  25 mg Oral Daily Greenwood, Howard F, RPH   25 mg at 11/09/24 9051   And    hydrochlorothiazide  (HYDRODIURIL ) tablet 25 mg  25 mg Oral Daily Greenwood, Howard F, RPH   25 mg at 11/09/24 9051   hydrOXYzine  (ATARAX ) tablet 25 mg  25 mg Oral TID PRN McLauchlin, Angela, NP       irbesartan  (AVAPRO ) tablet 37.5 mg  37.5 mg Oral Daily Jadapalle, Sree, MD   37.5 mg at 11/09/24 9051   magnesium  hydroxide (MILK OF MAGNESIA) suspension 30 mL  30 mL Oral Daily PRN McLauchlin, Angela, NP       metoprolol  tartrate (LOPRESSOR ) tablet 25 mg  25 mg Oral BID PRN Jadapalle, Sree, MD       multivitamin with minerals tablet 1 tablet  1 tablet Oral q morning Jadapalle, Sree, MD   1 tablet at 11/09/24 9051   naproxen  (NAPROSYN ) tablet 250 mg  250 mg Oral BID PRN Jadapalle, Sree, MD       pantoprazole  (PROTONIX ) EC tablet 40 mg  40 mg Oral Daily PRN Jadapalle, Sree, MD       rOPINIRole  (REQUIP ) tablet 1 mg  1 mg Oral QHS Jadapalle, Sree, MD   1 mg at 11/08/24 2123   simvastatin  (ZOCOR ) tablet 20 mg  20  mg Oral QHS Jadapalle, Sree, MD   20 mg at 11/08/24 2122   traZODone  (DESYREL ) tablet 50 mg  50 mg Oral QHS PRN McLauchlin, Angela, NP   50 mg at 11/08/24 2122   PTA Medications: Medications Prior to Admission  Medication Sig Dispense Refill Last Dose/Taking   ARTIFICIAL TEARS PF 0.1-0.3 % SOLN Place 1 drop into both eyes 3 (three) times daily as needed (for dryness).      aspirin  EC 81 MG tablet Take 81 mg by mouth at bedtime. Swallow whole.      Aspirin -Acetaminophen -Caffeine (GOODY HEADACHE PO) Take 1 packet by mouth as needed (for headaches).      buPROPion  (WELLBUTRIN  XL) 150 MG 24 hr tablet Take 150 mg by mouth every morning.      Calcium  Carb-Cholecalciferol 600-5 MG-MCG TABS Take 1 tablet by mouth daily.      clonazePAM  (KLONOPIN ) 0.5 MG tablet Take 0.5 mg by mouth 2 (two) times daily as needed for anxiety.      gabapentin  (NEURONTIN ) 300 MG capsule TAKE 1 CAPSULE BY MOUTH THREE TIMES A DAY 270 capsule 2    ibuprofen (ADVIL) 200 MG tablet Take 800 mg by mouth every 6 (six) hours as  needed for headache or mild pain.      metoprolol  tartrate (LOPRESSOR ) 50 MG tablet TAKE 1 TABLET BY MOUTH 2 TIMES DAILY AS NEEDED (MAY TAKE 50MG  1-2 TIMES DAILY FOR IRREGULAR HEART BEAT). 180 tablet 1    Multiple Vitamin (MULTIVITAMIN WITH MINERALS) TABS Take 1 tablet by mouth every morning.      naproxen  sodium (ALEVE ) 220 MG tablet Take 220-440 mg by mouth 2 (two) times daily as needed (for mild pain or headaches).      omeprazole (PRILOSEC OTC) 20 MG tablet Take 20 mg by mouth daily as needed (for reflux).      rOPINIRole  (REQUIP ) 1 MG tablet Take 1 mg by mouth at bedtime.      simvastatin  (ZOCOR ) 20 MG tablet Take 20 mg by mouth at bedtime.      spironolactone -hydrochlorothiazide  (ALDACTAZIDE) 25-25 MG tablet Take 1 tablet by mouth daily. 30 tablet 11    TYLENOL  8 HOUR 650 MG CR tablet Take 650-1,300 mg by mouth every 8 (eight) hours as needed for pain (or headaches).      valsartan (DIOVAN) 320 MG tablet Take 320 mg by mouth daily.      VRAYLAR  1.5 MG capsule Take 1.5 mg by mouth daily.       Patient Stressors:    Patient Strengths:    Treatment Modalities: Medication Management, Group therapy, Case management,  1 to 1 session with clinician, Psychoeducation, Recreational therapy.   Physician Treatment Plan for Primary Diagnosis: Bipolar 1 disorder (HCC) Long Term Goal(s): Improvement in symptoms so as ready for discharge   Short Term Goals: Ability to identify changes in lifestyle to reduce recurrence of condition will improve Ability to verbalize feelings will improve Ability to disclose and discuss suicidal ideas Ability to demonstrate self-control will improve Ability to identify and develop effective coping behaviors will improve  Medication Management: Evaluate patient's response, side effects, and tolerance of medication regimen.  Therapeutic Interventions: 1 to 1 sessions, Unit Group sessions and Medication administration.  Evaluation of Outcomes: Not  Progressing  Physician Treatment Plan for Secondary Diagnosis: Principal Problem:   Bipolar 1 disorder (HCC)  Long Term Goal(s): Improvement in symptoms so as ready for discharge   Short Term Goals: Ability to identify changes in lifestyle to reduce  recurrence of condition will improve Ability to verbalize feelings will improve Ability to disclose and discuss suicidal ideas Ability to demonstrate self-control will improve Ability to identify and develop effective coping behaviors will improve     Medication Management: Evaluate patient's response, side effects, and tolerance of medication regimen.  Therapeutic Interventions: 1 to 1 sessions, Unit Group sessions and Medication administration.  Evaluation of Outcomes: Not Progressing   RN Treatment Plan for Primary Diagnosis: Bipolar 1 disorder (HCC) Long Term Goal(s): Knowledge of disease and therapeutic regimen to maintain health will improve  Short Term Goals: Ability to remain free from injury will improve, Ability to verbalize frustration and anger appropriately will improve, Ability to demonstrate self-control, Ability to participate in decision making will improve, Ability to verbalize feelings will improve, Ability to disclose and discuss suicidal ideas, Ability to identify and develop effective coping behaviors will improve, and Compliance with prescribed medications will improve  Medication Management: RN will administer medications as ordered by provider, will assess and evaluate patient's response and provide education to patient for prescribed medication. RN will report any adverse and/or side effects to prescribing provider.  Therapeutic Interventions: 1 on 1 counseling sessions, Psychoeducation, Medication administration, Evaluate responses to treatment, Monitor vital signs and CBGs as ordered, Perform/monitor CIWA, COWS, AIMS and Fall Risk screenings as ordered, Perform wound care treatments as ordered.  Evaluation of  Outcomes: Not Progressing   LCSW Treatment Plan for Primary Diagnosis: Bipolar 1 disorder (HCC) Long Term Goal(s): Safe transition to appropriate next level of care at discharge, Engage patient in therapeutic group addressing interpersonal concerns.  Short Term Goals: Engage patient in aftercare planning with referrals and resources, Increase social support, Increase ability to appropriately verbalize feelings, Increase emotional regulation, Facilitate acceptance of mental health diagnosis and concerns, Facilitate patient progression through stages of change regarding substance use diagnoses and concerns, Identify triggers associated with mental health/substance abuse issues, and Increase skills for wellness and recovery  Therapeutic Interventions: Assess for all discharge needs, 1 to 1 time with Social worker, Explore available resources and support systems, Assess for adequacy in community support network, Educate family and significant other(s) on suicide prevention, Complete Psychosocial Assessment, Interpersonal group therapy.  Evaluation of Outcomes: Not Progressing   Progress in Treatment: Attending groups: No. Participating in groups: No. Taking medication as prescribed: Yes. Toleration medication: Yes. Family/Significant other contact made: No, will contact:  CSW will contact if given permission  Patient understands diagnosis: Yes. Discussing patient identified problems/goals with staff: Yes. Medical problems stabilized or resolved: Yes. Denies suicidal/homicidal ideation: No. Issues/concerns per patient self-inventory: No. Other: None   New problem(s) identified: No, Describe:  None identified   New Short Term/Long Term Goal(s):  elimination of symptoms of psychosis, medication management for mood stabilization; elimination of SI thoughts; development of comprehensive mental wellness plan.   Patient Goals:  Get back on my medications and clear my head of the thoughts of not  wanting to live  Discharge Plan or Barriers: CSW will assist with appropriate discharge planning   Reason for Continuation of Hospitalization: Depression Medication stabilization Suicidal ideation  Estimated Length of Stay: 1 to 7 days  Last 3 [place] Suicide Severity Risk Score: Flowsheet Row Admission (Current) from [facility]08/2024 [facility] from 11[facility]07/2024 in [facility]  C-SSRS RISK CATEGORY High Risk High Risk Low Risk    Last [facility] 2/9 Scores:    10/02/2021  3:54 PM  Depression screen PHQ 2/9  Decreased Interest 3  Down, Depressed, Hopeless 3  PHQ - 2 Score 6  Altered sleeping 3  Tired, decreased energy 3  Change in appetite 0  Feeling bad or failure about yourself  3  Trouble concentrating 3  Moving slowly or fidgety/restless 0  Suicidal thoughts 3  PHQ-9 Score 21   Difficult doing work/chores Somewhat difficult     Data saved with a previous flowsheet row definition    Scribe for Treatment Team: Lum JONETTA Croft, [place] 11/09/2024 10:49 AM

## 2024-11-09 NOTE — Progress Notes (Signed)
 [facility] MD Progress Note  11/09/2024 3:25 PM [name] Hinton  MRN:  [id number]  Patient is a 63 year old female with a history of Bipolar Disorder who presents voluntarily to [facility] for assessment. Patient presents via [facility] via [employer] with chief complaint of suicide attempt. [employer] was contacted by patient's mother after patient place a note on the bathroom door stating: Mom do not open this door!  Just call 911, and let them come in here.  You do not need to see me.  I am sorry!  This is what you need to tell the 911 operator: Tell them I told you not to come in.  I have slit my wrist and bled out.  I am dead (I hope!).    Patient endorses having suicidal thoughts for the past week and she confirms that today she locked herself in the bathroom with a knife and started to cut her wrist. Client also reported overdosing on 1400mg  of her heart medications a week ago.Patient is admitted to [facility] unit with Q15 min safety monitoring. Multidisciplinary team approach is offered. Medication management; group/milieu therapy is offered.  Subjective:  Chart reviewed, case discussed in multidisciplinary meeting, patient seen during rounds.    Patient is noted to be resting in her room.  Patient is aware that she will be getting a chest x-ray to evaluate her right side chest.  Pain.  She denies SI/HI/plan she rates her depression as 6 out of 10, 10 being the worst and anxiety as 5 out of 10.  She denies any panic attacks today.  She denies auditory/visual hallucinations. Past Psychiatric History: see h&P Family History:  Family History  Problem Relation Age of Onset   Breast cancer Neg Hx    Social History:  Social History   Substance and Sexual Activity  Alcohol  Use No     Social History   Substance and Sexual Activity  Drug Use No    Social History   Socioeconomic History   Marital status: Divorced    Spouse name: Not on file   Number of children: Not on file   Years of  education: Not on file   Highest education level: Not on file  Occupational History   Not on file  Tobacco Use   Smoking status: Some Days    Types: Cigarettes   Smokeless tobacco: Never   Tobacco comments:    03/07/2024 patient smokes about 4-5 cigarettes some days    02/09/2023 A pack last patient about a week  Substance and Sexual Activity   Alcohol  use: No   Drug use: No   Sexual activity: Yes    Birth control/protection: Surgical  Other Topics Concern   Not on file  Social History Narrative   Not on file   Social Drivers of Health   Financial Resource Strain: Not on File (09/25/2022)   Received from [employer]    Financial Resource Strain: 0  Food Insecurity: No Food Insecurity (11/08/2024)   Hunger Vital Sign    Worried About Running Out of Food in the Last Year: Never true    Ran Out of Food in the Last Year: Never true  Transportation Needs: No Transportation Needs (11/08/2024)   PRAPARE - Administrator, Civil Service (Medical): No    Lack of Transportation (Non-Medical): No  Physical Activity: Not on File (03/20/2022)   Received from [facility]   Physical Activity    Physical Activity:  0  Stress: Not on File (03/20/2022)   Received from [facility]   Stress    Stress: 0  Social Connections: Not on File (08/10/2023)   Received from [employer]   Social Connections    Connectedness: 0   Past Medical History:  Past Medical History:  Diagnosis Date   Anxiety    Bipolar affective (HCC)    Chronic headaches    Depression    Hypercholesteremia    Hypertension    Restless leg syndrome     Past Surgical History:  Procedure Laterality Date   ABDOMINAL HYSTERECTOMY     OOPHORECTOMY     TONSILLECTOMY      Current Medications: Current Facility-Administered Medications  Medication Dose Route Frequency Provider Last Rate Last Admin   acetaminophen  (TYLENOL ) tablet 650 mg  650 mg Oral Q6H PRN McLauchlin, Angela, NP   650 mg at 11/09/24 0813    alum & mag hydroxide-simeth (MAALOX/MYLANTA) 200-200-20 MG/5ML suspension 30 mL  30 mL Oral Q4H PRN McLauchlin, Angela, NP   30 mL at 11/09/24 1122   artificial tears ophthalmic solution 1 drop  1 drop Both Eyes TID PRN Donnelly Mellow, MD       aspirin  EC tablet 81 mg  81 mg Oral QHS Champagne Paletta, MD   81 mg at 11/08/24 2122   cariprazine  (VRAYLAR ) capsule 1.5 mg  1.5 mg Oral Daily Mykenzie Ebanks, MD   1.5 mg at 11/09/24 9051   clonazePAM  (KLONOPIN ) tablet 0.5 mg  0.5 mg Oral QHS Kenedi Cilia, MD       clonazePAM  (KLONOPIN ) tablet 0.5 mg  0.5 mg Oral Daily PRN Erikson Danzy, MD       haloperidol  (HALDOL ) tablet 5 mg  5 mg Oral TID PRN McLauchlin, Angela, NP       And   diphenhydrAMINE  (BENADRYL ) capsule 50 mg  50 mg Oral TID PRN McLauchlin, Jon, NP       haloperidol  lactate (HALDOL ) injection 5 mg  5 mg Intramuscular TID PRN McLauchlin, Angela, NP       And   diphenhydrAMINE  (BENADRYL ) injection 50 mg  50 mg Intramuscular TID PRN McLauchlin, Jon, NP       And   LORazepam  (ATIVAN ) injection 2 mg  2 mg Intramuscular TID PRN McLauchlin, Jon, NP       haloperidol  lactate (HALDOL ) injection 10 mg  10 mg Intramuscular TID PRN McLauchlin, Angela, NP       And   diphenhydrAMINE  (BENADRYL ) injection 50 mg  50 mg Intramuscular TID PRN McLauchlin, Angela, NP       And   LORazepam  (ATIVAN ) injection 2 mg  2 mg Intramuscular TID PRN McLauchlin, Angela, NP       gabapentin  (NEURONTIN ) capsule 300 mg  300 mg Oral TID Keyle Doby, MD   300 mg at 11/09/24 0948   spironolactone  (ALDACTONE ) tablet 25 mg  25 mg Oral Daily Greenwood, Howard F, RPH   25 mg at 11/09/24 9051   And   hydrochlorothiazide  (HYDRODIURIL ) tablet 25 mg  25 mg Oral Daily Greenwood, Howard F, RPH   25 mg at 11/09/24 9051   hydrOXYzine  (ATARAX ) tablet 25 mg  25 mg Oral TID PRN McLauchlin, Angela, NP       irbesartan  (AVAPRO ) tablet 37.5 mg  37.5 mg Oral Daily Kendall Arnell, MD   37.5 mg at 11/09/24 9051   magnesium   hydroxide (MILK OF MAGNESIA) suspension 30 mL  30 mL Oral Daily PRN McLauchlin, Angela, NP  metoprolol  tartrate (LOPRESSOR ) tablet 25 mg  25 mg Oral BID PRN Delonte Musich, MD       multivitamin with minerals tablet 1 tablet  1 tablet Oral q morning Jayant Kriz, MD   1 tablet at 11/09/24 9051   naproxen  (NAPROSYN ) tablet 250 mg  250 mg Oral BID PRN Zasha Belleau, MD       pantoprazole  (PROTONIX ) EC tablet 40 mg  40 mg Oral Daily PRN Yazir Koerber, MD       rOPINIRole  (REQUIP ) tablet 1 mg  1 mg Oral QHS Donnelly Mellow, MD   1 mg at 11/08/24 2123   simvastatin  (ZOCOR ) tablet 20 mg  20 mg Oral QHS Maxwell Martorano, MD   20 mg at 11/08/24 2122   traZODone  (DESYREL ) tablet 50 mg  50 mg Oral QHS PRN McLauchlin, Angela, NP   50 mg at 11/08/24 2122   [START ON 11/10/2024] venlafaxine  XR (EFFEXOR -XR) 24 hr capsule 37.5 mg  37.5 mg Oral Q breakfast Donnelly Mellow, MD        Lab Results:  Results for orders placed or performed during the hospital encounter of 11/07/24 (from the past 48 hours)  CBC with Differential/Platelet     Status: None   Collection Time: 11/07/24  6:20 PM  Result Value Ref Range   WBC 9.4 4.0 - 10.5 K/uL   RBC 4.21 3.87 - 5.11 MIL/uL   Hemoglobin 12.9 12.0 - 15.0 g/dL   HCT 61.4 63.9 - 53.9 %   MCV 91.4 80.0 - 100.0 fL   MCH 30.6 26.0 - 34.0 pg   MCHC 33.5 30.0 - 36.0 g/dL   RDW 87.3 88.4 - 84.4 %   Platelets 384 150 - 400 K/uL   nRBC 0.0 0.0 - 0.2 %   Neutrophils Relative % 71 %   Neutro Abs 6.9 1.7 - 7.7 K/uL   Lymphocytes Relative 15 %   Lymphs Abs 1.4 0.7 - 4.0 K/uL   Monocytes Relative 8 %   Monocytes Absolute 0.8 0.1 - 1.0 K/uL   Eosinophils Relative 4 %   Eosinophils Absolute 0.3 0.0 - 0.5 K/uL   Basophils Relative 1 %   Basophils Absolute 0.1 0.0 - 0.1 K/uL   Immature Granulocytes 1 %   Abs Immature Granulocytes 0.05 0.00 - 0.07 K/uL    Comment: Performed at [facility], [address] [place], [place] [address]  Comprehensive metabolic  panel     Status: Abnormal   Collection Time: 11/07/24  6:20 PM  Result Value Ref Range   Sodium 143 135 - 145 mmol/L   Potassium 3.3 (L) 3.5 - 5.1 mmol/L   Chloride 105 98 - 111 mmol/L   CO2 32 22 - 32 mmol/L   Glucose, Bld 101 (H) 70 - 99 mg/dL    Comment: Glucose reference range applies only to samples taken after fasting for at least 8 hours.   BUN 26 (H) 8 - 23 mg/dL   Creatinine, Ser 9.04 0.44 - 1.00 mg/dL   Calcium  9.4 8.9 - 10.3 mg/dL   Total Protein 6.8 6.5 - 8.1 g/dL   Albumin 3.7 3.5 - 5.0 g/dL   AST 22 15 - 41 U/L   ALT 17 0 - 44 U/L   Alkaline Phosphatase 86 38 - 126 U/L   Total Bilirubin 0.5 0.0 - 1.2 mg/dL   GFR, Estimated >39 >39 mL/min    Comment: (NOTE) Calculated using the CKD-EPI Creatinine Equation (2021)    Anion gap 6  5 - 15    Comment: Performed at [facility], [address], [place], [place] [address]  Magnesium      Status: None   Collection Time: 11/07/24  6:20 PM  Result Value Ref Range   Magnesium  1.9 1.7 - 2.4 mg/dL    Comment: Performed at [facility], [address], [place], [place] [address]  Ethanol     Status: None   Collection Time: 11/07/24  6:20 PM  Result Value Ref Range   Alcohol , Ethyl (B) <15 <15 mg/dL    Comment: (NOTE) For medical purposes only. Performed at [facility], [address], [place], [place] [address]   TSH     Status: None   Collection Time: 11/07/24  6:20 PM  Result Value Ref Range   TSH 0.976 0.350 - 4.500 uIU/mL    Comment: Performed by a 3rd Generation assay with a functional sensitivity of <=0.01 uIU/mL. Performed at [facility], [address] [place], [place] [address]   Prolactin     Status: None   Collection Time: 11/07/24  6:20 PM  Result Value Ref Range   Prolactin 8.7 3.6 - 25.2 ng/mL    Comment: (NOTE) Performed At: [facility] [address] [place], [place] [id number] Jennette Shorter MD Ey:[id number]   POCT Urine Drug Screen - (I-Screen)     Status:  Abnormal   Collection Time: 11/07/24  6:33 PM  Result Value Ref Range   POC Amphetamine UR None Detected NONE DETECTED (Cut Off Level 1000 ng/mL)   POC Secobarbital (BAR) None Detected NONE DETECTED (Cut Off Level 300 ng/mL)   POC Buprenorphine (BUP) None Detected NONE DETECTED (Cut Off Level 10 ng/mL)   POC Oxazepam (BZO) None Detected NONE DETECTED (Cut Off Level 300 ng/mL)   POC Cocaine UR Positive (A) NONE DETECTED (Cut Off Level 300 ng/mL)   POC Methamphetamine UR None Detected NONE DETECTED (Cut Off Level 1000 ng/mL)   POC Morphine None Detected NONE DETECTED (Cut Off Level 300 ng/mL)   POC Methadone UR None Detected NONE DETECTED (Cut Off Level 300 ng/mL)   POC Oxycodone  UR None Detected NONE DETECTED (Cut Off Level 100 ng/mL)   POC Marijuana UR None Detected NONE DETECTED (Cut Off Level 50 ng/mL)  POC SARS Coronavirus 2 Ag     Status: None   Collection Time: 11/07/24  7:02 PM  Result Value Ref Range   SARSCOV2ONAVIRUS 2 AG NEGATIVE NEGATIVE    Comment: (NOTE) SARS-CoV-2 antigen NOT DETECTED.   Negative results are presumptive.  Negative results do not preclude SARS-CoV-2 infection and should not be used as the sole basis for treatment or other patient management decisions, including infection  control decisions, particularly in the presence of clinical signs and  symptoms consistent with COVID-19, or in those who have been in contact with the virus.  Negative results must be combined with clinical observations, patient history, and epidemiological information. The expected result is Negative.  Fact Sheet for Patients: https://www.jennings-kim.com/  Fact Sheet for Healthcare Providers: https://alexander-rogers.biz/  This test is not yet approved or cleared by the [place]  FDA and  has been authorized for detection and/or diagnosis of SARS-CoV-2 by FDA under an Emergency Use Authorization (EUA).  This EUA will remain in effect (meaning this test  can be used) for the duration of  the COV ID-19 declaration under Section 564(b)(1) of the Act, 21 U.S.C. section 360bbb-3(b)(1), unless the authorization is terminated or revoked sooner.  SARS Coronavirus 2 by RT PCR (hospital order, performed in [facility] hospital lab) *cepheid single result test* Anterior Nasal Swab     Status: None   Collection Time: 11/07/24 10:08 PM   Specimen: Anterior Nasal Swab  Result Value Ref Range   SARS Coronavirus 2 by RT PCR NEGATIVE NEGATIVE    Comment: Performed at [facility], [address] [place], [place] [address]    Blood Alcohol  level:  Lab Results  Component Value Date   [facility] <15 11/07/2024    Metabolic Disorder Labs: No results found for: HGBA1C, MPG Lab Results  Component Value Date   PROLACTIN 8.7 11/07/2024   Lab Results  Component Value Date   CHOL 174 03/07/2024   TRIG 62 03/07/2024   HDL 62 03/07/2024   CHOLHDL 2.8 03/07/2024   VLDL 28 11/18/2007   LDLCALC 100 (H) 03/07/2024   LDLCALC (H) 11/18/2007    183        Total Cholesterol/HDL:CHD Risk Coronary Heart Disease Risk Table                     Men   Women  1/2 Average Risk   3.4   3.3    Physical Findings: AIMS:  , ,  ,  ,    CIWA:    COWS:      Psychiatric Specialty Exam:  Presentation  General Appearance:  Appropriate for Environment; Casual  Eye Contact: Minimal  Speech: Normal Rate  Speech Volume: Decreased    Mood and Affect  Mood: Anxious; Depressed  Affect: Depressed; Flat   Thought Process  Thought Processes: Coherent  Orientation:Full (Time, Place and Person)  Thought Content:Logical  Hallucinations:Hallucinations: None  Ideas of Reference:None  Suicidal Thoughts:Suicidal Thoughts: Yes, Active SI Active Intent and/or Plan: With Intent  Homicidal Thoughts:Homicidal Thoughts: No   Sensorium  Memory: Immediate Fair; Remote Fair  Judgment: Impaired  Insight: Shallow   Executive Functions   Concentration: Fair  Attention Span: Fair  Recall: Fair  Fund of Knowledge: Fair  Language: Fair   Psychomotor Activity  Psychomotor Activity: Psychomotor Activity: Normal  Musculoskeletal: Strength & Muscle Tone: within normal limits Gait & Station: normal Assets  Assets: Manufacturing Systems Engineer; Desire for Improvement    Physical Exam: Physical Exam ROS Blood pressure (!) 127/90, pulse 98, temperature 97.6 F (36.4 C), resp. rate 15, height 5' 2 (1.575 m), weight 51.3 kg, SpO2 99%. Body mass index is 20.67 kg/m.  Diagnosis: Active Problems:   Bipolar I disorder, most recent episode depressed [facility])   Clinical Decision Making: Patient is currently admitted after a suicide attempt by cutting herself in the context of worsening depression and grieving after the loss of her father and boyfriend.  She will be monitored closely on the inpatient unit  Treatment Plan Summary:  Safety and Monitoring:             -- Voluntary admission to inpatient psychiatric unit for safety, stabilization and treatment             -- Daily contact with patient to assess and evaluate symptoms and progress in treatment             -- Patient's case to be discussed in multi-disciplinary team meeting             -- Observation Level: q15 minute checks             -- Vital signs:  q12 hours             --  Precautions: suicide, elopement, and assault   2. Psychiatric Diagnoses and Treatment:               Will restart Klonopin  0.5 mg nightly and 0.5 mg as needed daily Discussed Effexor  XR 37.5 mg and patient gave consent   -- The risks/benefits/side-effects/alternatives to this medication were discussed in detail with the patient and time was given for questions. The patient consents to medication trial.                -- Metabolic profile and EKG monitoring obtained while on an atypical antipsychotic (BMI: Lipid Panel: HbgA1c: QTc:)              -- Encouraged patient to participate in  unit milieu and in scheduled group therapies                            3. Medical Issues Being Addressed:   4. Discharge Planning:   -- Social work and case management to assist with discharge planning and identification of hospital follow-up needs prior to discharge  -- Estimated LOS: 3-4 days  Allyn Foil, MD 11/09/2024, 3:25 PM

## 2024-11-10 DIAGNOSIS — F319 Bipolar disorder, unspecified: Secondary | ICD-10-CM | POA: Diagnosis not present

## 2024-11-10 MED ORDER — NAPROXEN 500 MG PO TABS
250.0000 mg | ORAL_TABLET | Freq: Two times a day (BID) | ORAL | Status: DC
  Administered 2024-11-10 – 2024-11-28 (×34): 250 mg via ORAL
  Filled 2024-11-10 (×39): qty 1

## 2024-11-10 NOTE — Group Note (Signed)
 LCSW Group Therapy Note  Group Date: 11/10/2024 Start Time: 1330 End Time: 1400   Type of Therapy and Topic:  Group Therapy - Healthy vs Unhealthy Coping Skills  Participation Level:  Active   Description of Group The focus of this group was to determine what unhealthy coping techniques typically are used by group members and what healthy coping techniques would be helpful in coping with various problems. Patients were guided in becoming aware of the differences between healthy and unhealthy coping techniques. Patients were asked to identify 2-3 healthy coping skills they would like to learn to use more effectively.  Therapeutic Goals Patients learned that coping is what human beings do all day long to deal with various situations in their lives Patients defined and discussed healthy vs unhealthy coping techniques Patients identified their preferred coping techniques and identified whether these were healthy or unhealthy Patients determined 2-3 healthy coping skills they would like to become more familiar with and use more often. Patients provided support and ideas to each other   Summary of Patient Progress:  During group, Aviona expressed feelings about the usefulness of outpatient therapy. Patient proved open to input from peers and feedback from CSW. Patient demonstrated good insight into the subject matter, was respectful of peers, and participated throughout the entire session.   Therapeutic Modalities Cognitive Behavioral Therapy Motivational Interviewing  Lum JONETTA Croft, LCSWA 11/10/2024  2:11 PM

## 2024-11-10 NOTE — Group Note (Signed)
 Date:  11/10/2024 Time:  10:56 AM  Group Topic/Focus:  Self Care:   The focus of this group is to help patients understand the importance of self-care in order to improve or restore emotional, physical, spiritual, interpersonal, and financial health. We did chair yoga to stretch our body's and get moving.    Participation Level:  Active  Participation Quality:  Appropriate  Affect:  Appropriate  Cognitive:  Alert  Insight: Appropriate  Engagement in Group:  Engaged  Modes of Intervention:  Activity  Additional Comments:    Leigh VEAR Pais 11/10/2024, 10:56 AM
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
## 2024-11-10 NOTE — Plan of Care (Signed)
  Problem: Self-Concept: Goal: Level of anxiety will decrease Outcome: Progressing Goal: Ability to modify response to factors that promote anxiety will improve Outcome: Progressing   

## 2024-11-10 NOTE — Progress Notes (Signed)
°   11/09/24 1355  Spiritual Encounters  Type of Visit Initial  Care provided to: Patient  Referral source Chaplain assessment  Reason for visit Routine spiritual support  OnCall Visit No  Interventions  Spiritual Care Interventions Made Established relationship of care and support   I sought to establish a relationship of care and invite Kimaria Marengo to group. After review of notes, wanted to make spiritual care support available. Deniah welcomed my visit but wanted to rest, stated she was having pain around her ribs. I let her know that I would follow up later and wished her relief with the discomfort.  Note: attempted follow up later 12/10 but Shakeya went out for xray. Will stop by on 12/11. Please page chaplain on-call if support desired before that time.  Nelle Sayed L. Delores HERO.Div

## 2024-11-10 NOTE — Progress Notes (Signed)
 [facility] MD Progress Note  11/10/2024 10:41 PM [name] Hinton  MRN:  [id number]  Patient is a 63 year old Hinton with a history of Bipolar Disorder who presents voluntarily to [facility] for assessment. Patient presents via [facility] via [employer] with chief complaint of suicide attempt. [employer] was contacted by patient's mother after patient place a note on [name] bathroom door stating: Mom do not open this door!  Just call 911, and let them come in here.  You do not need to see me.  I am sorry!  This is what you need to tell [name] 911 operator: Tell them I told you not to come in.  I have slit my wrist and bled out.  I am dead (I hope!).    Patient endorses having suicidal thoughts for [name] past week and she confirms that today she locked herself in [name] bathroom with a knife and started to cut her wrist. Client also reported overdosing on 1400mg  of her heart medications a week ago.Patient is admitted to [facility] unit with Q15 min safety monitoring. Multidisciplinary team approach is offered. Medication management; group/milieu therapy is offered.  Subjective:  Chart reviewed, case discussed in multidisciplinary meeting, patient seen during rounds.   Patient is noted to be sitting in [name] dayroom area.  She came to her room to talk with [name] provider.  Patient wanted to know [name] results of her x-ray.  Patient reports previous history of broken ribs.  Patient rates her depression as 4 out of 10, 10 being [name] worst and anxiety is 4 out of 10.  She denies any panic attacks in [name] last 24 hours.  She denies having any suicidal ideation in [name] last 24 hours and denies homicidal ideation.  She did want [name] provider to talk to her mom. Past Psychiatric History: see h&P Family History:  Family History  Problem Relation Age of Onset   Breast cancer Neg Hx    Social History:  Social History   Substance and Sexual Activity  Alcohol  Use No     Social History   Substance and Sexual Activity  Drug Use No     Social History   Socioeconomic History   Marital status: Divorced    Spouse name: Not on file   Number of children: Not on file   Years of education: Not on file   Highest education level: Not on file  Occupational History   Not on file  Tobacco Use   Smoking status: Some Days    Types: Cigarettes   Smokeless tobacco: Never   Tobacco comments:    03/07/2024 patient smokes about 4-5 cigarettes some days    02/09/2023 A pack last patient about a week  Substance and Sexual Activity   Alcohol  use: No   Drug use: No   Sexual activity: Yes    Birth control/protection: Surgical  Other Topics Concern   Not on file  Social History Narrative   Not on file   Social Drivers of Health   Tobacco Use: High Risk (11/08/2024)   Patient History    Smoking Tobacco Use: Some Days    Smokeless Tobacco Use: Never    Passive Exposure: Not on file  Financial Resource Strain: Not on File (09/25/2022)   Received from [facility]    Financial Resource Strain: 0  Food Insecurity: No Food Insecurity (11/08/2024)   Epic    Worried About Radiation Protection Practitioner of Food in [name] Last Year: Never  true    Ran Out of Food in [name] Last Year: Never true  Transportation Needs: No Transportation Needs (11/08/2024)   Epic    Lack of Transportation (Medical): No    Lack of Transportation (Non-Medical): No  Physical Activity: Not on File (03/20/2022)   Received from [facility]   Physical Activity    Physical Activity: 0  Stress: Not on File (03/20/2022)   Received from [facility]   Stress    Stress: 0  Social Connections: Not on File (08/10/2023)   [facility]   Social Connections    Connectedness: 0  Depression (PHQ2-9): Not on file  Alcohol  Screen: Low Risk (11/07/2024)   Alcohol  Screen    Last Alcohol  Screening Score (AUDIT): 1  Housing: Low Risk (11/08/2024)   Epic    Unable to Pay for Housing in [name] Last Year: No    Number of Times Moved in [name] Last Year: 0    Homeless in [name] Last Year: No   Utilities: Not At Risk (11/08/2024)   Epic    Threatened with loss of utilities: No  Health Literacy: Not on file   Past Medical History:  Past Medical History:  Diagnosis Date   Anxiety    Bipolar affective (HCC)    Chronic headaches    Depression    Hypercholesteremia    Hypertension    Restless leg syndrome     Past Surgical History:  Procedure Laterality Date   ABDOMINAL HYSTERECTOMY     OOPHORECTOMY     TONSILLECTOMY      Current Medications: Current Facility-Administered Medications  Medication Dose Route Frequency Provider Last Rate Last Admin   acetaminophen  (TYLENOL ) tablet 650 mg  650 mg Oral Q6H PRN McLauchlin, Angela, NP   650 mg at 11/10/24 1556   alum & mag hydroxide-simeth (MAALOX/MYLANTA) 200-200-20 MG/5ML suspension 30 mL  30 mL Oral Q4H PRN McLauchlin, Angela, NP   30 mL at 11/10/24 1628   artificial tears ophthalmic solution 1 drop  1 drop Both Eyes TID PRN Donnelly Mellow, MD       aspirin  EC tablet 81 mg  81 mg Oral QHS Jnyah Brazee, MD   81 mg at 11/10/24 2145   clonazePAM  (KLONOPIN ) tablet 0.5 mg  0.5 mg Oral QHS Bobak Oguinn, MD   0.5 mg at 11/10/24 2145   clonazePAM  (KLONOPIN ) tablet 0.5 mg  0.5 mg Oral Daily PRN Daymeon Fischman, MD       haloperidol  (HALDOL ) tablet 5 mg  5 mg Oral TID PRN McLauchlin, Angela, NP       And   diphenhydrAMINE  (BENADRYL ) capsule 50 mg  50 mg Oral TID PRN McLauchlin, Jon, NP       haloperidol  lactate (HALDOL ) injection 5 mg  5 mg Intramuscular TID PRN McLauchlin, Angela, NP       And   diphenhydrAMINE  (BENADRYL ) injection 50 mg  50 mg Intramuscular TID PRN McLauchlin, Angela, NP       And   LORazepam  (ATIVAN ) injection 2 mg  2 mg Intramuscular TID PRN McLauchlin, Angela, NP       haloperidol  lactate (HALDOL ) injection 10 mg  10 mg Intramuscular TID PRN McLauchlin, Angela, NP       And   diphenhydrAMINE  (BENADRYL ) injection 50 mg  50 mg Intramuscular TID PRN McLauchlin, Angela, NP       And   LORazepam  (ATIVAN )  injection 2 mg  2 mg Intramuscular TID PRN McLauchlin, Angela, NP  gabapentin  (NEURONTIN ) capsule 300 mg  300 mg Oral TID Triton Heidrich, MD   300 mg at 11/10/24 2145   spironolactone  (ALDACTONE ) tablet 25 mg  25 mg Oral Daily Greenwood, Howard F, RPH   25 mg at 11/10/24 9051   And   hydrochlorothiazide  (HYDRODIURIL ) tablet 25 mg  25 mg Oral Daily Greenwood, Howard F, RPH   25 mg at 11/10/24 9052   hydrOXYzine  (ATARAX ) tablet 25 mg  25 mg Oral TID PRN McLauchlin, Angela, NP       irbesartan  (AVAPRO ) tablet 37.5 mg  37.5 mg Oral Daily Anjela Cassara, MD   37.5 mg at 11/10/24 9052   magnesium  hydroxide (MILK OF MAGNESIA) suspension 30 mL  30 mL Oral Daily PRN McLauchlin, Angela, NP       metoprolol  tartrate (LOPRESSOR ) tablet 25 mg  25 mg Oral BID PRN Jamon Hayhurst, MD   25 mg at 11/10/24 0753   multivitamin with minerals tablet 1 tablet  1 tablet Oral q morning Josehua Hammar, MD   1 tablet at 11/10/24 9052   naproxen  (NAPROSYN ) tablet 250 mg  250 mg Oral BID WC Janicia Monterrosa, MD   250 mg at 11/10/24 1728   nicotine  (NICODERM CQ  - dosed in mg/24 hours) patch 14 mg  14 mg Transdermal Daily Canton Yearby, MD   14 mg at 11/10/24 0946   pantoprazole  (PROTONIX ) EC tablet 40 mg  40 mg Oral Daily PRN Alyne Martinson, MD       rOPINIRole  (REQUIP ) tablet 1 mg  1 mg Oral QHS Keta Vanvalkenburgh, MD   1 mg at 11/10/24 2145   simvastatin  (ZOCOR ) tablet 20 mg  20 mg Oral QHS Marquite Attwood, MD   20 mg at 11/10/24 2145   traZODone  (DESYREL ) tablet 50 mg  50 mg Oral QHS PRN McLauchlin, Angela, NP   50 mg at 11/10/24 2145   venlafaxine  XR (EFFEXOR -XR) 24 hr capsule 37.5 mg  37.5 mg Oral Q breakfast Nikea Settle, MD   37.5 mg at 11/10/24 9250    Lab Results:  No results found for this or any previous visit (from [name] past 48 hours).   Blood Alcohol  level:  Lab Results  Component Value Date   [facility] <15 11/07/2024    Metabolic Disorder Labs: No results found for: HGBA1C, MPG Lab Results   Component Value Date   PROLACTIN 8.7 11/07/2024   Lab Results  Component Value Date   CHOL 174 03/07/2024   TRIG 62 03/07/2024   HDL 62 03/07/2024   CHOLHDL 2.8 03/07/2024   VLDL 28 11/18/2007   LDLCALC 100 (H) 03/07/2024   LDLCALC (H) 11/18/2007    183        Total Cholesterol/HDL:CHD Risk Coronary Heart Disease Risk Table                     Men   Women  1/2 Average Risk   3.4   3.3    Physical Findings: AIMS:  , ,  ,  ,    CIWA:    COWS:      Psychiatric Specialty Exam:  Presentation  General Appearance:  Appropriate for Environment; Casual  Eye Contact: Minimal  Speech: Normal Rate  Speech Volume: Decreased    Mood and Affect  Mood: Anxious; Depressed  Affect: Depressed; Flat   Thought Process  Thought Processes: Coherent  Orientation:Full (Time, Place and Person)  Thought Content:Logical  Hallucinations: Denies  Ideas of Reference:None  Suicidal Thoughts: Denies active SI  Homicidal Thoughts: Denies   Sensorium  Memory: Immediate Fair; Remote Fair  Judgment: Impaired  Insight: Shallow   Executive Functions  Concentration: Fair  Attention Span: Fair  Recall: [employer] of Knowledge: Fair  Language: Fair   Psychomotor Activity  Psychomotor Activity: No data recorded  Musculoskeletal: Strength & Muscle Tone: within normal limits Gait & Station: normal Assets  Assets: Manufacturing Systems Engineer; Desire for Improvement    Physical Exam: Physical Exam ROS Blood pressure 107/65, pulse 84, temperature 97.9 F (36.6 C), resp. rate 18, height 5' 2 (1.575 m), weight 51.3 kg, SpO2 98%. Body mass index is 20.67 kg/m.  Diagnosis: Active Problems:   Bipolar I disorder, most recent episode depressed [facility]   Clinical Decision Making: Patient is currently admitted after a suicide attempt by cutting herself in [name] context of worsening depression and grieving after [name] loss of her father and boyfriend.  She will be  monitored closely on [name] inpatient unit  Treatment Plan Summary:  Safety and Monitoring:             -- Voluntary admission to inpatient psychiatric unit for safety, stabilization and treatment             -- Daily contact with patient to assess and evaluate symptoms and progress in treatment             -- Patient's case to be discussed in multi-disciplinary team meeting             -- Observation Level: q15 minute checks             -- Vital signs:  q12 hours             -- Precautions: suicide, elopement, and assault   2. Psychiatric Diagnoses and Treatment:                Klonopin  0.5 mg nightly and 0.5 mg as needed daily  Effexor  XR 37.5 mg    -- [name] risks/benefits/side-effects/alternatives to this medication were discussed in detail with [name] patient and time was given for questions. [name] patient consents to medication trial.                -- Metabolic profile and EKG monitoring obtained while on an atypical antipsychotic (BMI: Lipid Panel: HbgA1c: QTc:)              -- Encouraged patient to participate in unit milieu and in scheduled group therapies                            3. Medical Issues Being Addressed:   4. Discharge Planning:   -- Social work and case management to assist with discharge planning and identification of hospital follow-up needs prior to discharge  -- Estimated LOS: 3-4 days  Allyn Foil, MD 11/10/2024, 10:41 PM

## 2024-11-10 NOTE — Group Note (Signed)
 Date:  11/10/2024 Time:  8:51 PM  Group Topic/Focus:  Healthy Communication:   The focus of this group is to discuss communication, barriers to communication, as well as healthy ways to communicate with others.    Participation Level:  Active  Participation Quality:  Appropriate  Affect:  Appropriate  Cognitive:  Appropriate  Insight: Good  Engagement in Group:  Engaged  Modes of Intervention:  Discussion  Additional Comments:    [name] Hinton 11/10/2024, 8:51 PM

## 2024-11-10 NOTE — Group Note (Signed)
 Date:  11/10/2024 Time:  3:57 PM  Group Topic/Focus:     Music for gero psych (geriatric psychology/dementia care) focuses on familiar, personalized music from a person's memory bump to reduce agitation, improve mood, spark memories, and enhance communication, with genres like soft classical, jazz, hymns, and old favorites (e.g., Singing in the [place], Early, big band) being effective, alongside tailored music therapy techniques that match tempo and use simple melodies for calming effects.   Participation Level:  Active  Participation Quality:  Appropriate  Affect:  Appropriate  Cognitive:  Appropriate  Insight: Appropriate  Engagement in Group:  Engaged  Modes of Intervention:  Socialization  Additional Comments:  N/A  Harlene LITTIE Gavel 11/10/2024, 3:57 PM

## 2024-11-10 NOTE — Group Note (Signed)
 Recreation Therapy Group Note   Group Topic:Leisure Education  Group Date: 11/10/2024 Start Time: 1400 End Time: 1440 Facilitators: Celestia Jeoffrey BRAVO, LRT, CTRS Location: Courtyard  Group Description: Music. Patients are encouraged to name their favorite song(s) for LRT to play song through speaker for group to hear, while in the courtyard getting fresh air and sunlight. Patients educated on the definition of leisure and the importance of having different leisure interests outside of the hospital. Group discussed how leisure activities can often be used as pharmacologist and that listening to music and being outside are examples.    Goal Area(s) Addressed:  Patient will identify a current leisure interest.  Patient will practice making a positive decision. Patient will have the opportunity to try a new leisure activity.   Affect/Mood: Appropriate   Participation Level: Minimal    Clinical Observations/Individualized Feedback: [name] Hinton was originally present in group, however was pulled by LCSW.  Plan: Continue to engage patient in RT group sessions 2-3x/week.   Jeoffrey BRAVO Celestia, LRT, CTRS 11/10/2024 4:33 PM

## 2024-11-11 DIAGNOSIS — F319 Bipolar disorder, unspecified: Secondary | ICD-10-CM | POA: Diagnosis not present

## 2024-11-11 MED ORDER — VENLAFAXINE HCL ER 75 MG PO CP24
75.0000 mg | ORAL_CAPSULE | Freq: Every day | ORAL | Status: DC
  Administered 2024-11-12 – 2024-11-15 (×4): 75 mg via ORAL
  Filled 2024-11-11 (×4): qty 1

## 2024-11-11 NOTE — Group Note (Signed)
 Recreation Therapy Group Note   Group Topic:Leisure Education  Group Date: 11/11/2024 Start Time: 1405 End Time: 1500 Facilitators: Celestia Jeoffrey BRAVO, LRT, CTRS Location: Dayroom  Group Description: Bingo. Patients played multiple rounds of bingo. LRT and pts discussed the definition of leisure, things they do in their free time outside of the hospital, and how bingo is also a leisure activity. Pts received a coloring book, word search book, or journal as a prize.    Goal Area(s) Addressed:  Patient will identify a current leisure interest.  Patient will learn the definition of leisure. Patient will have the opportunity to try a new leisure activity. Patient will communicate with peers and LRT.   Affect/Mood: Appropriate   Participation Level: Active and Engaged   Participation Quality: Independent   Behavior: Calm and Cooperative   Speech/Thought Process: Coherent   Insight: Fair   Judgement: Fair    Modes of Intervention: Cooperative Play, Music, and Socialization   Patient Response to Interventions:  Attentive, Engaged, and Receptive   Education Outcome:  Acknowledges education   Clinical Observations/Individualized Feedback: Timothea was active in their participation of session activities and group discussion. Pt won a facilities manager and chose a journal as a scientist, research (physical sciences).    Plan: Continue to engage patient in RT group sessions 2-3x/week.   Jeoffrey BRAVO Celestia, LRT, CTRS 11/11/2024 4:50 PM

## 2024-11-11 NOTE — Progress Notes (Addendum)
 Patient reported being lightheaded and dizzy at approx 1300. BP 93/60 and P 105. Gatorade offered and accepted. Counseled patient to ask for assistance when needing to ambulate. Verbalized understanding. Upon follow up, BP increased to 120/81 and P 101 however patient reports irregular pulse. Metoprolol  25 mg adm PRN as per MD order.

## 2024-11-11 NOTE — Group Note (Signed)
 Date:  11/11/2024 Time:  3:29 PM  Group Topic/Focus:  House rules and expectations for the unit    Participation Level:  Active  Participation Quality:  Appropriate  Affect:  Appropriate  Cognitive:  Appropriate  Insight: Appropriate  Engagement in Group:  Engaged  Modes of Intervention:  Discussion and Education  Additional Comments:  none  Norleen SHAUNNA Bias 11/11/2024, 3:29 PM

## 2024-11-11 NOTE — Progress Notes (Signed)
°   11/11/24 1500  Psych Admission Type (Psych Patients Only)  Admission Status Voluntary  Psychosocial Assessment  Patient Complaints Depression  Eye Contact Fair  Facial Expression Other (Comment) (appropriate)  Affect Sad  Speech Logical/coherent  Interaction Assertive  Motor Activity Slow  Appearance/Hygiene In scrubs  Behavior Characteristics Cooperative  Mood Depressed  Thought Process  Coherency WDL  Content WDL  Delusions None reported or observed  Perception WDL  Hallucination None reported or observed  Judgment WDL  Confusion None  Danger to Self  Current suicidal ideation? Denies  Danger to Others  Danger to Others None reported or observed

## 2024-11-11 NOTE — Progress Notes (Signed)
°   11/10/24 2000  Psych Admission Type (Psych Patients Only)  Admission Status Voluntary  Psychosocial Assessment  Patient Complaints Depression  Eye Contact Fair  Facial Expression Flat  Affect Appropriate to circumstance  Speech Logical/coherent  Interaction Assertive  Motor Activity Slow  Appearance/Hygiene In scrubs  Behavior Characteristics Cooperative  Mood Depressed  Thought Process  Coherency WDL  Content WDL  Delusions None reported or observed  Perception WDL  Hallucination None reported or observed  Judgment WDL  Confusion None  Danger to Self  Current suicidal ideation? Denies  Self-Injurious Behavior No self-injurious ideation or behavior indicators observed or expressed   Agreement Not to Harm Self Yes  Description of Agreement verbal  Danger to Others  Danger to Others None reported or observed   Mood/Behavior:  Pleasant and cooperative.    Psych assessment: Denies SI/HI and AVH.  Denies anxiety.   Interaction / Group attendance: Pt isolative to self in room sleeping. Did not attend group.    Medication/ PRNs: Compliant with scheduled medications. Required PRNs Trazodone  for sleep and noted effective.   Pain: Denies   15 min checks in place for safety.

## 2024-11-11 NOTE — Plan of Care (Signed)
   Problem: Health Behavior/Discharge Planning: Goal: Identification of resources available to assist in meeting health care needs will improve Outcome: Progressing Goal: Compliance with treatment plan for underlying cause of condition will improve Outcome: Progressing

## 2024-11-11 NOTE — Group Note (Signed)
 Date:  11/11/2024 Time:  12:32 PM  Group Topic/Focus:  Identifying Needs:   The focus of this group is to help patients identify their personal needs that have been historically problematic and identify healthy behaviors to address their needs.    Participation Level:  Did Not Attend   [name] Hinton 11/11/2024, 12:32 PM

## 2024-11-11 NOTE — Group Note (Signed)
 Physical/Occupational Therapy Group Note  Group Topic: UE Therex   Group Date: 11/11/2024 Start Time: 1300 End Time: 1345 Facilitators: Clive Warren CROME, OT   Group Description: Group instructed in series of upper extremities exercises, aimed to promote strength, flexibility, range of motion and functional endurance.  Patients provided cuing for proper mechanics and proper pace of exercise; exercises adjusted as necessary for individualized patient needs.  Patient also engaged in cognitive components throughout session, working to integrate attention to task, command following, turn-taking and appropriate social interaction throughout session.  Allowed to ask questions as appropriate, and encouraged to identify specific exercises that they could complete independently outside of group sessions.  Therapeutic Goal(s):  Demonstrate appropriate performance of upper extremity exercises to promote strength, flexibility, range of motion and functional endurance Identify 2-3 specific upper extremity exercises to complete as home exercise program outside of group session  Individual Participation: Pt engaged throughout, requiring multimodal cues for <25% of exercises performed. Engaged in group discussion with and without prompting. Became intermittently distracted by other participants but responded well with gently redirection.   Participation Level: Active and Engaged   Participation Quality: Minimal Cues   Behavior: Alert, Appropriate, Attentive , Distracted, and Disruptive   Speech/Thought Process: Relevant   Affect/Mood: Appropriate   Insight: Fair   Judgement: Fair   Modes of Intervention: Activity, Clarification, Discussion, Education, Exploration, Problem-solving, Rapport Building, Socialization, and Support  Patient Response to Interventions:  Attentive, Engaged, and Interested    Plan: Continue to engage patient in PT/OT groups 1 - 2x/week.  Yazlynn Birkeland R., MPH, MS, OTR/L ascom  [phone] 11/11/2024, 4:23 PM

## 2024-11-11 NOTE — Progress Notes (Signed)
 [facility] MD Progress Note  11/11/2024 10:59 PM [name] Hinton  MRN:  [id number]  Patient is a 63 year old female with a history of Bipolar Disorder who presents voluntarily to [facility] for assessment. Patient presents via [facility] via [employer] with chief complaint of suicide attempt. [employer] was contacted by patient's mother after patient place a note on the bathroom door stating: Mom do not open this door!  Just call 911, and let them come in here.  You do not need to see me.  I am sorry!  This is what you need to tell the 911 operator: Tell them I told you not to come in.  I have slit my wrist and bled out.  I am dead (I hope!).    Patient endorses having suicidal thoughts for the past week and she confirms that today she locked herself in the bathroom with a knife and started to cut her wrist. Client also reported overdosing on 1400mg  of her heart medications a week ago.Patient is admitted to [facility] unit with Q15 min safety monitoring. Multidisciplinary team approach is offered. Medication management; group/milieu therapy is offered.  Subjective:  Chart reviewed, case discussed in multidisciplinary meeting, patient seen during rounds.   Patient is noted to be resting in her room.  She offers no complaints.  Patient and provider discussed at length the phone conversation with patient's mom.  Patient's mom has informed the team that patient is not allowed to come back to her house stating patient's chronic suicidal behavior and depression has taken a toll on her and she is age 39 and is not able to care for the patient.  Patient reports not having any other friends or family members that she can support her and has no place to go.  Patient continues to state feeling hopeless.  She denies active SI/HI but does report being homeless and having no support as for life.  Patient was encouraged to work with the social work team in finding options for placement.  Patient did acknowledge using cocaine  before the admission and reports she used cocaine 8-10 times during this year.  Patient was agreeable for inpatient rehab programs. Past Psychiatric History: see h&P Family History:  Family History  Problem Relation Age of Onset   Breast cancer Neg Hx    Social History:  Social History   Substance and Sexual Activity  Alcohol  Use No     Social History   Substance and Sexual Activity  Drug Use No    Social History   Socioeconomic History   Marital status: Divorced    Spouse name: Not on file   Number of children: Not on file   Years of education: Not on file   Highest education level: Not on file  Occupational History   Not on file  Tobacco Use   Smoking status: Some Days    Types: Cigarettes   Smokeless tobacco: Never   Tobacco comments:    03/07/2024 patient smokes about 4-5 cigarettes some days    02/09/2023 A pack last patient about a week  Substance and Sexual Activity   Alcohol  use: No   Drug use: No   Sexual activity: Yes    Birth control/protection: Surgical  Other Topics Concern   Not on file  Social History Narrative   Not on file   Social Drivers of Health   Tobacco Use: High Risk (11/08/2024)   Patient History    Smoking Tobacco Use: Some Days  Smokeless Tobacco Use: Never    Passive Exposure: Not on file  Financial Resource Strain: Not on File (09/25/2022)   Received from [employer]    Financial Resource Strain: 0  Food Insecurity: No Food Insecurity (11/08/2024)   Epic    Worried About Programme Researcher, Broadcasting/film/video in the Last Year: Never true    Ran Out of Food in the Last Year: Never true  Transportation Needs: No Transportation Needs (11/08/2024)   Epic    Lack of Transportation (Medical): No    Lack of Transportation (Non-Medical): No  Physical Activity: Not on File (03/20/2022)   Received from [facility]   Physical Activity    Physical Activity: 0  Stress: Not on File (03/20/2022)   Received from [facility]   Stress    Stress: 0   Social Connections: Not on File (08/10/2023)   Received from [facility]   Social Connections    Connectedness: 0  Depression (PHQ2-9): Not on file  Alcohol  Screen: Low Risk (11/07/2024)   Alcohol  Screen    Last Alcohol  Screening Score (AUDIT): 1  Housing: Low Risk (11/08/2024)   Epic    Unable to Pay for Housing in the Last Year: No    Number of Times Moved in the Last Year: 0    Homeless in the Last Year: No  Utilities: Not At Risk (11/08/2024)   Epic    Threatened with loss of utilities: No  Health Literacy: Not on file   Past Medical History:  Past Medical History:  Diagnosis Date   Anxiety    Bipolar affective (HCC)    Chronic headaches    Depression    Hypercholesteremia    Hypertension    Restless leg syndrome     Past Surgical History:  Procedure Laterality Date   ABDOMINAL HYSTERECTOMY     OOPHORECTOMY     TONSILLECTOMY      Current Medications: Current Facility-Administered Medications  Medication Dose Route Frequency Provider Last Rate Last Admin   acetaminophen  (TYLENOL ) tablet 650 mg  650 mg Oral Q6H PRN McLauchlin, Angela, NP   650 mg at 11/10/24 1556   alum & mag hydroxide-simeth (MAALOX/MYLANTA) 200-200-20 MG/5ML suspension 30 mL  30 mL Oral Q4H PRN McLauchlin, Angela, NP   30 mL at 11/10/24 1628   artificial tears ophthalmic solution 1 drop  1 drop Both Eyes TID PRN Yudith Norlander, MD       aspirin  EC tablet 81 mg  81 mg Oral QHS Breely Panik, MD   81 mg at 11/11/24 2111   clonazePAM  (KLONOPIN ) tablet 0.5 mg  0.5 mg Oral QHS Ram Haugan, MD   0.5 mg at 11/11/24 2112   clonazePAM  (KLONOPIN ) tablet 0.5 mg  0.5 mg Oral Daily PRN Donnelly Mellow, MD       haloperidol  (HALDOL ) tablet 5 mg  5 mg Oral TID PRN McLauchlin, Angela, NP       And   diphenhydrAMINE  (BENADRYL ) capsule 50 mg  50 mg Oral TID PRN McLauchlin, Angela, NP       haloperidol  lactate (HALDOL ) injection 5 mg  5 mg Intramuscular TID PRN McLauchlin, Angela, NP       And   diphenhydrAMINE   (BENADRYL ) injection 50 mg  50 mg Intramuscular TID PRN McLauchlin, Angela, NP       And   LORazepam  (ATIVAN ) injection 2 mg  2 mg Intramuscular TID PRN McLauchlin, Angela, NP       haloperidol  lactate (HALDOL ) injection 10 mg  10 mg Intramuscular TID PRN McLauchlin, Angela, NP       And   diphenhydrAMINE  (BENADRYL ) injection 50 mg  50 mg Intramuscular TID PRN McLauchlin, Angela, NP       And   LORazepam  (ATIVAN ) injection 2 mg  2 mg Intramuscular TID PRN McLauchlin, Angela, NP       gabapentin  (NEURONTIN ) capsule 300 mg  300 mg Oral TID Duante Arocho, MD   300 mg at 11/11/24 2111   spironolactone  (ALDACTONE ) tablet 25 mg  25 mg Oral Daily Greenwood, Howard F, RPH   25 mg at 11/11/24 9068   And   hydrochlorothiazide  (HYDRODIURIL ) tablet 25 mg  25 mg Oral Daily Greenwood, Howard F, RPH   25 mg at 11/11/24 0931   hydrOXYzine  (ATARAX ) tablet 25 mg  25 mg Oral TID PRN McLauchlin, Angela, NP   25 mg at 11/11/24 2111   irbesartan  (AVAPRO ) tablet 37.5 mg  37.5 mg Oral Daily Desarea Ohagan, MD   37.5 mg at 11/11/24 1013   magnesium  hydroxide (MILK OF MAGNESIA) suspension 30 mL  30 mL Oral Daily PRN McLauchlin, Angela, NP       metoprolol  tartrate (LOPRESSOR ) tablet 25 mg  25 mg Oral BID PRN Lorelle Macaluso, MD   25 mg at 11/11/24 1502   multivitamin with minerals tablet 1 tablet  1 tablet Oral q morning Lloyd Cullinan, MD   1 tablet at 11/11/24 9068   naproxen  (NAPROSYN ) tablet 250 mg  250 mg Oral BID WC Leshaun Biebel, MD   250 mg at 11/11/24 1657   nicotine  (NICODERM CQ  - dosed in mg/24 hours) patch 14 mg  14 mg Transdermal Daily Azzure Garabedian, MD   14 mg at 11/11/24 0933   pantoprazole  (PROTONIX ) EC tablet 40 mg  40 mg Oral Daily PRN Eliz Nigg, MD       rOPINIRole  (REQUIP ) tablet 1 mg  1 mg Oral QHS Jenalyn Girdner, MD   1 mg at 11/11/24 2111   simvastatin  (ZOCOR ) tablet 20 mg  20 mg Oral QHS Harrison Paulson, MD   20 mg at 11/11/24 2111   traZODone  (DESYREL ) tablet 50 mg  50 mg Oral QHS  PRN McLauchlin, Angela, NP   50 mg at 11/11/24 2111   venlafaxine  XR (EFFEXOR -XR) 24 hr capsule 37.5 mg  37.5 mg Oral Q breakfast Maloree Uplinger, MD   37.5 mg at 11/11/24 9197    Lab Results:  No results found for this or any previous visit (from the past 48 hours).   Blood Alcohol  level:  Lab Results  Component Value Date   [facility] <15 11/07/2024    Metabolic Disorder Labs: No results found for: HGBA1C, MPG Lab Results  Component Value Date   PROLACTIN 8.7 11/07/2024   Lab Results  Component Value Date   CHOL 174 03/07/2024   TRIG 62 03/07/2024   HDL 62 03/07/2024   CHOLHDL 2.8 03/07/2024   VLDL 28 11/18/2007   LDLCALC 100 (H) 03/07/2024   LDLCALC (H) 11/18/2007    183        Total Cholesterol/HDL:CHD Risk Coronary Heart Disease Risk Table                     Men   Women  1/2 Average Risk   3.4   3.3    Physical Findings: AIMS:  , ,  ,  ,    CIWA:    COWS:      Psychiatric Specialty Exam:  Presentation  General Appearance:  Appropriate for Environment; Casual  Eye Contact: Minimal  Speech: Normal Rate  Speech Volume: Decreased    Mood and Affect  Mood: Anxious; Depressed  Affect: Depressed; Flat   Thought Process  Thought Processes: Coherent  Orientation:Full (Time, Place and Person)  Thought Content:Logical  Hallucinations: Denies  Ideas of Reference:None  Suicidal Thoughts: Denies active SI  Homicidal Thoughts: Denies   Sensorium  Memory: Immediate Fair; Remote Fair  Judgment: Impaired  Insight: Shallow   Executive Functions  Concentration: Fair  Attention Span: Fair  Recall: [employer] of Knowledge: Fair  Language: Fair   Psychomotor Activity  Psychomotor Activity: No data recorded  Musculoskeletal: Strength & Muscle Tone: within normal limits Gait & Station: normal Assets  Assets: Manufacturing Systems Engineer; Desire for Improvement    Physical Exam: Physical Exam ROS Blood pressure 101/74,  pulse 82, temperature 99.1 F (37.3 C), temperature source Oral, resp. rate 20, height 5' 2 (1.575 m), weight 51.3 kg, SpO2 99%. Body mass index is 20.67 kg/m.  Diagnosis: Active Problems:   Bipolar I disorder, most recent episode depressed [facility])   Clinical Decision Making: Patient is currently admitted after a suicide attempt by cutting herself in the context of worsening depression and grieving after the loss of her father and boyfriend.  She will be monitored closely on the inpatient unit  Treatment Plan Summary:  Safety and Monitoring:             -- Voluntary admission to inpatient psychiatric unit for safety, stabilization and treatment             -- Daily contact with patient to assess and evaluate symptoms and progress in treatment             -- Patient's case to be discussed in multi-disciplinary team meeting             -- Observation Level: q15 minute checks             -- Vital signs:  q12 hours             -- Precautions: suicide, elopement, and assault   2. Psychiatric Diagnoses and Treatment:                Klonopin  0.5 mg nightly and 0.5 mg as needed daily  Effexor  XR 37.5 mg-increased to 75 mg   -- The risks/benefits/side-effects/alternatives to this medication were discussed in detail with the patient and time was given for questions. The patient consents to medication trial.                -- Metabolic profile and EKG monitoring obtained while on an atypical antipsychotic (BMI: Lipid Panel: HbgA1c: QTc:)              -- Encouraged patient to participate in unit milieu and in scheduled group therapies                            3. Medical Issues Being Addressed:   4. Discharge Planning:   -- Social work and case management to assist with discharge planning and identification of hospital follow-up needs prior to discharge  -- Estimated LOS: 3-4 days  Allyn Foil, MD 11/11/2024, 10:59 PM

## 2024-11-11 NOTE — [facility] Suicide Risk Assessment (Signed)
 [facility] INPATIENT:  Family/Significant Other Suicide Prevention Education  Suicide Prevention Education:  Family/Significant Other Refusal to Support Patient after Discharge:  Suicide Prevention Education Not Provided:  Patient has identified home of family/significant other as the place the patient will be residing after discharge.  With written consent of the patient, an attempt was made to provide Suicide Prevention Education to [name] Hinton, mother, [phone], (name of family member/significant other).  This person indicates he/she will not be responsible for the patient after discharge.  Pt's mother reports pt claimed she was going to kill herself and went into the bathroom and held a knife to herself.   Pt's mom reports that she has told pt she has to get out and find a place to live on her own and that's when pt said she was suicidal.  Pt's mother reports she has been putting up with it for years. Pt's mom reports in the past pt has threatened to take a hand full of pills.   Pt mom reports pt CANNOT come back to her home at discharge.   [name] Hinton 11/11/2024,3:52 PM

## 2024-11-11 NOTE — Group Note (Signed)
 Date:  11/11/2024 Time:  8:43 PM  Group Topic/Focus:  Healthy Communication:   The focus of this group is to discuss communication, barriers to communication, as well as healthy ways to communicate with others.    Participation Level:  Active  Participation Quality:  Appropriate  Affect:  Appropriate  Cognitive:  Appropriate  Insight: Good  Engagement in Group:    Modes of Intervention:  Discussion  Additional Comments:    [name] Hinton 11/11/2024, 8:43 PM

## 2024-11-11 NOTE — Plan of Care (Signed)
  Problem: Education: Goal: Ability to state activities that reduce stress will improve Outcome: Progressing   Problem: Coping: Goal: Ability to identify and develop effective coping behavior will improve Outcome: Progressing   Problem: Self-Concept: Goal: Ability to identify factors that promote anxiety will improve Outcome: Progressing Goal: Level of anxiety will decrease Outcome: Progressing Goal: Ability to modify response to factors that promote anxiety will improve Outcome: Progressing   Problem: Education: Goal: Knowledge of [facility] Education information/materials will improve Outcome: Progressing Goal: Emotional status will improve Outcome: Progressing Goal: Mental status will improve Outcome: Progressing Goal: Verbalization of understanding the information provided will improve Outcome: Progressing   Problem: Activity: Goal: Interest or engagement in activities will improve Outcome: Progressing Goal: Sleeping patterns will improve Outcome: Progressing   Problem: Coping: Goal: Ability to verbalize frustrations and anger appropriately will improve Outcome: Progressing Goal: Ability to demonstrate self-control will improve Outcome: Progressing   Problem: Health Behavior/Discharge Planning: Goal: Identification of resources available to assist in meeting health care needs will improve Outcome: Progressing Goal: Compliance with treatment plan for underlying cause of condition will improve Outcome: Progressing   Problem: Physical Regulation: Goal: Ability to maintain clinical measurements within normal limits will improve Outcome: Progressing   Problem: Safety: Goal: Periods of time without injury will increase Outcome: Progressing   Problem: Education: Goal: Utilization of techniques to improve thought processes will improve Outcome: Progressing Goal: Knowledge of the prescribed therapeutic regimen will improve Outcome: Progressing   Problem:  Activity: Goal: Interest or engagement in leisure activities will improve Outcome: Progressing Goal: Imbalance in normal sleep/wake cycle will improve Outcome: Progressing   Problem: Coping: Goal: Coping ability will improve Outcome: Progressing Goal: Will verbalize feelings Outcome: Progressing   Problem: Health Behavior/Discharge Planning: Goal: Ability to make decisions will improve Outcome: Progressing Goal: Compliance with therapeutic regimen will improve Outcome: Progressing   Problem: Role Relationship: Goal: Will demonstrate positive changes in social behaviors and relationships Outcome: Progressing   Problem: Safety: Goal: Ability to disclose and discuss suicidal ideas will improve Outcome: Progressing Goal: Ability to identify and utilize support systems that promote safety will improve Outcome: Progressing   Problem: Self-Concept: Goal: Will verbalize positive feelings about self Outcome: Progressing Goal: Level of anxiety will decrease Outcome: Progressing   

## 2024-11-12 DIAGNOSIS — F319 Bipolar disorder, unspecified: Secondary | ICD-10-CM | POA: Diagnosis not present

## 2024-11-12 NOTE — Group Note (Signed)
 Date:  11/12/2024 Time:  4:13 PM  Group Topic/Focus:  Healthy Communication:   The focus of this group is to discuss communication, barriers to communication, as well as healthy ways to communicate with others.    Participation Level:  Active  Participation Quality:  Appropriate  Affect:  Appropriate  Cognitive:  Appropriate  Insight: Appropriate  Engagement in Group:  Engaged  Modes of Intervention:  Socialization  Additional Comments:  N/A  Harlene LITTIE Gavel 11/12/2024, 4:13 PM

## 2024-11-12 NOTE — Group Note (Signed)
 Date:  11/12/2024 Time:  8:51 PM  Group Topic/Focus:  Wrap-Up Group:   The focus of this group is to help patients review their daily goal of treatment and discuss progress on daily workbooks.    Participation Level:  Active  Participation Quality:  Appropriate  Affect:  Appropriate  Cognitive:  Appropriate  Insight: Appropriate  Engagement in Group:  Engaged  Modes of Intervention:  Discussion  Additional Comments:    [name] Hinton 11/12/2024, 8:51 PM

## 2024-11-12 NOTE — Progress Notes (Signed)
°   11/12/24 0800  Psych Admission Type (Psych Patients Only)  Admission Status Voluntary  Psychosocial Assessment  Patient Complaints Nervousness  Eye Contact Fair  Facial Expression Other (Comment) (appropriate)  Affect Sad  Speech Logical/coherent  Interaction Assertive  Motor Activity Slow  Appearance/Hygiene In scrubs  Behavior Characteristics Cooperative  Mood Depressed;Anxious  Thought Process  Coherency WDL  Content WDL  Delusions None reported or observed  Perception WDL  Hallucination None reported or observed  Judgment WDL  Confusion None  Danger to Self  Current suicidal ideation? Denies  Danger to Others  Danger to Others None reported or observed

## 2024-11-12 NOTE — Plan of Care (Signed)
°  Problem: Education: Goal: Ability to state activities that reduce stress will improve Outcome: Progressing   Problem: Coping: Goal: Ability to identify and develop effective coping behavior will improve Outcome: Progressing   Problem: Self-Concept: Goal: Ability to identify factors that promote anxiety will improve Outcome: Progressing Goal: Level of anxiety will decrease Outcome: Progressing Goal: Ability to modify response to factors that promote anxiety will improve Outcome: Progressing   Problem: Education: Goal: Knowledge of [place] [facility] Education information/materials will improve Outcome: Progressing   Problem: Education: Goal: Emotional status will improve Outcome: Progressing   Problem: Education: Goal: Mental status will improve Outcome: Progressing   Problem: Education: Goal: Verbalization of understanding the information provided will improve Outcome: Progressing   Problem: Activity: Goal: Interest or engagement in activities will improve Outcome: Progressing Goal: Sleeping patterns will improve Outcome: Progressing   Problem: Activity: Goal: Sleeping patterns will improve Outcome: Progressing   Problem: Coping: Goal: Ability to verbalize frustrations and anger appropriately will improve Outcome: Progressing Goal: Ability to demonstrate self-control will improve Outcome: Progressing   Problem: Health Behavior/Discharge Planning: Goal: Identification of resources available to assist in meeting health care needs will improve Outcome: Progressing Goal: Compliance with treatment plan for underlying cause of condition will improve Outcome: Progressing

## 2024-11-12 NOTE — Plan of Care (Signed)
  Problem: Education: Goal: Ability to state activities that reduce stress will improve Outcome: Not Progressing   Problem: Coping: Goal: Ability to identify and develop effective coping behavior will improve Outcome: Not Progressing

## 2024-11-12 NOTE — Group Note (Signed)
 Date:  11/12/2024 Time:  10:15 AM  Group Topic/Focus:  Movement Therapy    Participation Level:  Active  Participation Quality:  Appropriate  Affect:  Appropriate  Cognitive:  Appropriate  Insight: Appropriate  Engagement in Group:  Engaged  Modes of Intervention:  Activity  Additional Comments:  none  Norleen SHAUNNA Bias 11/12/2024, 10:15 AM

## 2024-11-12 NOTE — Progress Notes (Signed)
 [facility] MD Progress Note  11/12/2024 7:23 PM QUIN MCPHERSON  MRN:  [id number]  Patient is a 63 year old female with a history of Bipolar Disorder who presents voluntarily to [facility] for assessment. Patient presents via [facility] via [employer] with chief complaint of suicide attempt. [employer] was contacted by patient's mother after patient place a note on the bathroom door stating: Mom do not open this door!  Just call 911, and let them come in here.  You do not need to see me.  I am sorry!  This is what you need to tell the 911 operator: Tell them I told you not to come in.  I have slit my wrist and bled out.  I am dead (I hope!).    Patient endorses having suicidal thoughts for the past week and she confirms that today she locked herself in the bathroom with a knife and started to cut her wrist. Client also reported overdosing on 1400mg  of her heart medications a week ago.Patient is admitted to [facility] unit with Q15 min safety monitoring. Multidisciplinary team approach is offered. Medication management; group/milieu therapy is offered.  Subjective:  Chart reviewed, case discussed in multidisciplinary meeting, patient seen during rounds.   Patient is noted to be resting in her room.  She offers no complaints.  Patient and provider discussed at length the phone conversation with patient's mom.  Patient's mom has informed the team that patient is not allowed to come back to her house stating patient's chronic suicidal behavior and depression has taken a toll on her and she is age 5 and is not able to care for the patient.  Patient reports not having any other friends or family members that she can support her and has no place to go.  Patient continues to state feeling hopeless.  She denies active SI/HI but does report being homeless and having no support as for life.  Patient was encouraged to work with the social work team in finding options for placement.  Patient did acknowledge using cocaine  before the admission and reports she used cocaine 8-10 times during this year.  Patient was agreeable for inpatient rehab programs.  11/12/2024: Patient is assessed on the inpatient unit and ambulates to her room to speak with provider without assistance. She reports that she is doing well, endorses nicotine  cravings only and has a nicotine  patch on currently. She continues to be agreeable to substance abuse treatment. She continues to be medication compliant and denies concerns regarding her regimen. Endorses improvement in depressive symptoms and denies suicidal thoughts at this time. Has been attending groups and engaging appropriately. Sleeping and eating well., Denies SI, HI, AVH.    Past Psychiatric History: see h&P Family History:  Family History  Problem Relation Age of Onset   Breast cancer Neg Hx    Social History:  Social History   Substance and Sexual Activity  Alcohol  Use No     Social History   Substance and Sexual Activity  Drug Use No    Social History   Socioeconomic History   Marital status: Divorced    Spouse name: Not on file   Number of children: Not on file   Years of education: Not on file   Highest education level: Not on file  Occupational History   Not on file  Tobacco Use   Smoking status: Some Days    Types: Cigarettes   Smokeless tobacco: Never   Tobacco comments:  03/07/2024 patient smokes about 4-5 cigarettes some days    02/09/2023 A pack last patient about a week  Substance and Sexual Activity   Alcohol  use: No   Drug use: No   Sexual activity: Yes    Birth control/protection: Surgical  Other Topics Concern   Not on file  Social History Narrative   Not on file   Social Drivers of Health   Tobacco Use: High Risk (11/08/2024)   Patient History    Smoking Tobacco Use: Some Days    Smokeless Tobacco Use: Never    Passive Exposure: Not on file  Financial Resource Strain: Not on File (09/25/2022)   Received from [employer]    Financial Resource Strain: 0  Food Insecurity: No Food Insecurity (11/08/2024)   Epic    Worried About Programme Researcher, Broadcasting/film/video in the Last Year: Never true    Ran Out of Food in the Last Year: Never true  Transportation Needs: No Transportation Needs (11/08/2024)   Epic    Lack of Transportation (Medical): No    Lack of Transportation (Non-Medical): No  Physical Activity: Not on File (03/20/2022)   Received from [facility]   Physical Activity    Physical Activity: 0  Stress: Not on File (03/20/2022)   Received from [facility]   Stress    Stress: 0  Social Connections: Not on File (08/10/2023)   Received from [facility]   Social Connections    Connectedness: 0  Depression (PHQ2-9): Not on file  Alcohol  Screen: Low Risk (11/07/2024)   Alcohol  Screen    Last Alcohol  Screening Score (AUDIT): 1  Housing: Low Risk (11/08/2024)   Epic    Unable to Pay for Housing in the Last Year: No    Number of Times Moved in the Last Year: 0    Homeless in the Last Year: No  Utilities: Not At Risk (11/08/2024)   Epic    Threatened with loss of utilities: No  Health Literacy: Not on file   Past Medical History:  Past Medical History:  Diagnosis Date   Anxiety    Bipolar affective (HCC)    Chronic headaches    Depression    Hypercholesteremia    Hypertension    Restless leg syndrome     Past Surgical History:  Procedure Laterality Date   ABDOMINAL HYSTERECTOMY     OOPHORECTOMY     TONSILLECTOMY      Current Medications: Current Facility-Administered Medications  Medication Dose Route Frequency Provider Last Rate Last Admin   acetaminophen  (TYLENOL ) tablet 650 mg  650 mg Oral Q6H PRN McLauchlin, Angela, NP   650 mg at 11/10/24 1556   alum & mag hydroxide-simeth (MAALOX/MYLANTA) 200-200-20 MG/5ML suspension 30 mL  30 mL Oral Q4H PRN McLauchlin, Angela, NP   30 mL at 11/10/24 1628   artificial tears ophthalmic solution 1 drop  1 drop Both Eyes TID PRN Jadapalle, Sree, MD       aspirin  EC  tablet 81 mg  81 mg Oral QHS Jadapalle, Sree, MD   81 mg at 11/11/24 2111   clonazePAM  (KLONOPIN ) tablet 0.5 mg  0.5 mg Oral QHS Jadapalle, Sree, MD   0.5 mg at 11/11/24 2112   clonazePAM  (KLONOPIN ) tablet 0.5 mg  0.5 mg Oral Daily PRN Donnelly Mellow, MD       haloperidol  (HALDOL ) tablet 5 mg  5 mg Oral TID PRN McLauchlin, Jon, NP       And   diphenhydrAMINE  (BENADRYL )  capsule 50 mg  50 mg Oral TID PRN McLauchlin, Jon, NP       haloperidol  lactate (HALDOL ) injection 5 mg  5 mg Intramuscular TID PRN McLauchlin, Angela, NP       And   diphenhydrAMINE  (BENADRYL ) injection 50 mg  50 mg Intramuscular TID PRN McLauchlin, Jon, NP       And   LORazepam  (ATIVAN ) injection 2 mg  2 mg Intramuscular TID PRN McLauchlin, Jon, NP       haloperidol  lactate (HALDOL ) injection 10 mg  10 mg Intramuscular TID PRN McLauchlin, Jon, NP       And   diphenhydrAMINE  (BENADRYL ) injection 50 mg  50 mg Intramuscular TID PRN McLauchlin, Jon, NP       And   LORazepam  (ATIVAN ) injection 2 mg  2 mg Intramuscular TID PRN McLauchlin, Angela, NP       gabapentin  (NEURONTIN ) capsule 300 mg  300 mg Oral TID Jadapalle, Sree, MD   300 mg at 11/12/24 1655   spironolactone  (ALDACTONE ) tablet 25 mg  25 mg Oral Daily Greenwood, Howard F, RPH   25 mg at 11/12/24 9051   And   hydrochlorothiazide  (HYDRODIURIL ) tablet 25 mg  25 mg Oral Daily Greenwood, Howard F, RPH   25 mg at 11/12/24 9051   hydrOXYzine  (ATARAX ) tablet 25 mg  25 mg Oral TID PRN McLauchlin, Angela, NP   25 mg at 11/11/24 2111   irbesartan  (AVAPRO ) tablet 37.5 mg  37.5 mg Oral Daily Jadapalle, Sree, MD   37.5 mg at 11/12/24 9052   magnesium  hydroxide (MILK OF MAGNESIA) suspension 30 mL  30 mL Oral Daily PRN McLauchlin, Angela, NP       metoprolol  tartrate (LOPRESSOR ) tablet 25 mg  25 mg Oral BID PRN Jadapalle, Sree, MD   25 mg at 11/11/24 1502   multivitamin with minerals tablet 1 tablet  1 tablet Oral q morning Jadapalle, Sree, MD   1 tablet at 11/12/24  9051   naproxen  (NAPROSYN ) tablet 250 mg  250 mg Oral BID WC Jadapalle, Sree, MD   250 mg at 11/12/24 1655   nicotine  (NICODERM CQ  - dosed in mg/24 hours) patch 14 mg  14 mg Transdermal Daily Jadapalle, Sree, MD   14 mg at 11/12/24 9051   pantoprazole  (PROTONIX ) EC tablet 40 mg  40 mg Oral Daily PRN Jadapalle, Sree, MD       rOPINIRole  (REQUIP ) tablet 1 mg  1 mg Oral QHS Jadapalle, Sree, MD   1 mg at 11/11/24 2111   simvastatin  (ZOCOR ) tablet 20 mg  20 mg Oral QHS Jadapalle, Sree, MD   20 mg at 11/11/24 2111   traZODone  (DESYREL ) tablet 50 mg  50 mg Oral QHS PRN McLauchlin, Angela, NP   50 mg at 11/11/24 2111   venlafaxine  XR (EFFEXOR -XR) 24 hr capsule 75 mg  75 mg Oral Q breakfast Jadapalle, Sree, MD   75 mg at 11/12/24 9193    Lab Results:  No results found for this or any previous visit (from the past 48 hours).   Blood Alcohol  level:  Lab Results  Component Value Date   [facility] <15 11/07/2024    Metabolic Disorder Labs: No results found for: HGBA1C, MPG Lab Results  Component Value Date   PROLACTIN 8.7 11/07/2024   Lab Results  Component Value Date   CHOL 174 03/07/2024   TRIG 62 03/07/2024   HDL 62 03/07/2024   CHOLHDL 2.8 03/07/2024   VLDL 28 11/18/2007   LDLCALC 100 (  H) 03/07/2024   LDLCALC (H) 11/18/2007    183        Total Cholesterol/HDL:CHD Risk Coronary Heart Disease Risk Table                     Men   Women  1/2 Average Risk   3.4   3.3    Physical Findings: AIMS:  , ,  ,  ,    CIWA:    COWS:      Psychiatric Specialty Exam:  Presentation  General Appearance:  Appropriate for Environment; Casual  Eye Contact: Minimal  Speech: Normal Rate  Speech Volume: Decreased    Mood and Affect  Mood: Anxious; Depressed  Affect: Depressed; Flat   Thought Process  Thought Processes: Coherent  Orientation:Full (Time, Place and Person)  Thought Content:Logical  Hallucinations: Denies  Ideas of Reference:None  Suicidal Thoughts: Denies  active SI  Homicidal Thoughts: Denies   Sensorium  Memory: Immediate Fair; Remote Fair  Judgment: Impaired  Insight: Shallow   Executive Functions  Concentration: Fair  Attention Span: Fair  Recall: [employer] of Knowledge: Fair  Language: Fair   Psychomotor Activity  Psychomotor Activity: No data recorded  Musculoskeletal: Strength & Muscle Tone: within normal limits Gait & Station: normal Assets  Assets: Manufacturing Systems Engineer; Desire for Improvement    Physical Exam: Physical Exam Review of Systems  Constitutional: Negative.   HENT: Negative.    Eyes: Negative.   Respiratory: Negative.    Cardiovascular: Negative.   Gastrointestinal: Negative.   Genitourinary: Negative.   Musculoskeletal: Negative.   Skin: Negative.   Neurological: Negative.   Endo/Heme/Allergies: Negative.   Psychiatric/Behavioral:  Positive for depression.    Blood pressure (!) 152/85, pulse (!) 104, temperature 98.3 F (36.8 C), resp. rate 15, height 5' 2 (1.575 m), weight 51.3 kg, SpO2 99%. Body mass index is 20.67 kg/m.  Diagnosis: Active Problems:   Bipolar I disorder, most recent episode depressed [facility])   Clinical Decision Making: Patient is currently admitted after a suicide attempt by cutting herself in the context of worsening depression and grieving after the loss of her father and boyfriend.  She will be monitored closely on the inpatient unit  Treatment Plan Summary:  Safety and Monitoring:             -- Voluntary admission to inpatient psychiatric unit for safety, stabilization and treatment             -- Daily contact with patient to assess and evaluate symptoms and progress in treatment             -- Patient's case to be discussed in multi-disciplinary team meeting             -- Observation Level: q15 minute checks             -- Vital signs:  q12 hours             -- Precautions: suicide, elopement, and assault   2. Psychiatric Diagnoses and  Treatment:                Klonopin  0.5 mg nightly and 0.5 mg as needed daily  Effexor  XR 37.5 mg-increased to 75 mg   -- The risks/benefits/side-effects/alternatives to this medication were discussed in detail with the patient and time was given for questions. The patient consents to medication trial.                -- Metabolic profile  and EKG monitoring obtained while on an atypical antipsychotic (BMI: Lipid Panel: HbgA1c: QTc:)              -- Encouraged patient to participate in unit milieu and in scheduled group therapies                            3. Medical Issues Being Addressed:   4. Discharge Planning:   -- Social work and case management to assist with discharge planning and identification of hospital follow-up needs prior to discharge  -- Estimated LOS: 3-4 days  Daine KATHEE Ober, NP 11/12/2024, 7:23 PM

## 2024-11-12 NOTE — Group Note (Signed)
 [facility] LCSW Group Therapy Note   Group Date: 11/12/2024 Start Time: 1601 End Time: 1631   Type of Therapy and Topic: Group Therapy: Avoiding Self-Sabotaging and Enabling Behaviors  Participation Level: Active  Mood: Calm  Description of Group:  In this group, patients will learn how to identify obstacles, self-sabotaging and enabling behaviors, as well as: what are they, why do we do them and what needs these behaviors meet. Discuss unhealthy relationships and how to have positive healthy boundaries with those that sabotage and enable. Explore aspects of self-sabotage and enabling in yourself and how to limit these self-destructive behaviors in everyday life.   Therapeutic Goals: 1. Patient will identify one obstacle that relates to self-sabotage and enabling behaviors 2. Patient will identify one personal self-sabotaging or enabling behavior they did prior to admission 3. Patient will state a plan to change the above identified behavior 4. Patient will demonstrate ability to communicate their needs through discussion and/or role play.    Summary of Patient Progress:  The facilitator and patient discussed self-sabotaging behaviors and their impact on daily life.  Group members chose and shared  strategies for managing it.  The facilitator and patient examined how different experiences can influence their behavior.  The patient reflected on how their behaviors shape their current thoughts and feelings about themselves.  The patient was receptive to feedback from both peers and the facilitator and contributed to creating a supportive environment, encouraging others to open up and shar    Therapeutic Modalities:  Cognitive Behavioral Therapy Person-Centered Therapy Motivational Interviewing    Rexene LELON Mae, LCSWA

## 2024-11-13 DIAGNOSIS — F319 Bipolar disorder, unspecified: Secondary | ICD-10-CM | POA: Diagnosis not present

## 2024-11-13 MED ORDER — GABAPENTIN 400 MG PO CAPS
400.0000 mg | ORAL_CAPSULE | Freq: Three times a day (TID) | ORAL | Status: DC
  Administered 2024-11-13 – 2024-11-16 (×9): 400 mg via ORAL
  Filled 2024-11-13 (×9): qty 1

## 2024-11-13 MED ORDER — INFLUENZA VIRUS VACC SPLIT PF (FLUZONE) 0.5 ML IM SUSY
0.5000 mL | PREFILLED_SYRINGE | INTRAMUSCULAR | Status: AC
  Administered 2024-11-14: 11:00:00 0.5 mL via INTRAMUSCULAR
  Filled 2024-11-13: qty 0.5

## 2024-11-13 NOTE — Plan of Care (Signed)
  Problem: Coping: Goal: Ability to identify and develop effective coping behavior will improve Outcome: Progressing   Problem: Self-Concept: Goal: Ability to identify factors that promote anxiety will improve Outcome: Progressing   

## 2024-11-13 NOTE — [facility] Group Notes (Signed)
 [facility] Group Notes:  (Nursing/MHT/Case Management/Adjunct)  Date:  11/13/2024  Time:  3:57 PM  Type of Therapy:  Nurse Education  Participation Level:  Did Not Attend    Modes of Intervention:  Clarification and Support provided.   Summary of Progress/Problems: Patient encouraged to attend group.  Zona  Lyall Faciane 11/13/2024, 3:57 PM

## 2024-11-13 NOTE — Progress Notes (Signed)
 D- Patient alert and oriented. Pt presents with a pleasant mood and affect. Pt  denies SI, HI, AVH, but endorses headache pain 4/10 and anxiety 8/10. Pt states anxiety is better when speaking with others in the dayroom.   A- Scheduled/ PRN medications administered to patient, per MD orders. Support and encouragement provided.  Routine safety checks conducted every 15 minutes.  Patient informed to notify staff with problems or concerns.  R- No adverse drug reactions noted. Patient contracts for safety at this time. Patient compliant with medications and treatment plan. Patient receptive, calm, and cooperative. Patient interacts well with others on the unit.  Patient remains safe at this time.    Carsyn Taubman S.,RN

## 2024-11-13 NOTE — Plan of Care (Signed)
   Problem: Education: Goal: Ability to state activities that reduce stress will improve Outcome: Progressing   Problem: Coping: Goal: Ability to identify and develop effective coping behavior will improve Outcome: Progressing

## 2024-11-13 NOTE — Group Note (Signed)
 Date:  11/13/2024 Time:  10:21 AM  Group Topic/Focus:  Movement Therapy, Morning Stretch with Peggyann Zwiefelhofer.    Participation Level:  Active  Participation Quality:  Appropriate  Affect:  Appropriate  Cognitive:  Appropriate  Insight: Appropriate  Engagement in Group:  Engaged  Modes of Intervention:  Activity  Additional Comments:  none  Norleen SHAUNNA Bias 11/13/2024, 10:21 AM

## 2024-11-13 NOTE — Group Note (Signed)
 LCSW Group Therapy Note  Group Date: 11/13/2024 Start Time: 1300 End Time: 1345   Type of Therapy and Topic:  Group Therapy - Healthy vs Unhealthy Coping Skills  Participation Level:  Did Not Attend   Description of Group The focus of this group was to determine what unhealthy coping techniques typically are used by group members and what healthy coping techniques would be helpful in coping with various problems. Patients were guided in becoming aware of the differences between healthy and unhealthy coping techniques. Patients were asked to identify 2-3 healthy coping skills they would like to learn to use more effectively.  Therapeutic Goals Patients learned that coping is what human beings do all day long to deal with various situations in their lives Patients defined and discussed healthy vs unhealthy coping techniques Patients identified their preferred coping techniques and identified whether these were healthy or unhealthy Patients determined 2-3 healthy coping skills they would like to become more familiar with and use more often. Patients provided support and ideas to each other   Summary of Patient Progress:  Did not attend group   Therapeutic Modalities Cognitive Behavioral Therapy Motivational Interviewing  Aldo CHRISTELLA Niece, [place] 11/13/2024  3:47 PM

## 2024-11-13 NOTE — Plan of Care (Signed)
°  Problem: Coping: Goal: Ability to identify and develop effective coping behavior will improve Outcome: Progressing   Problem: Self-Concept: Goal: Ability to modify response to factors that promote anxiety will improve Outcome: Progressing   Problem: Education: Goal: Verbalization of understanding the information provided will improve Outcome: Progressing

## 2024-11-13 NOTE — Progress Notes (Signed)
°   11/13/24 0800  Psych Admission Type (Psych Patients Only)  Admission Status Voluntary  Psychosocial Assessment  Patient Complaints Nervousness  Eye Contact Fair  Facial Expression Other (Comment) (appropriate)  Affect Sad  Speech Logical/coherent  Interaction Assertive  Motor Activity Slow  Appearance/Hygiene In scrubs  Behavior Characteristics Cooperative  Mood Depressed;Anxious  Thought Process  Coherency WDL  Content WDL  Delusions None reported or observed  Perception WDL  Hallucination None reported or observed  Judgment WDL  Confusion None  Danger to Self  Current suicidal ideation? Denies  Danger to Others  Danger to Others None reported or observed

## 2024-11-13 NOTE — Progress Notes (Signed)
 [facility] MD Progress Note  2024/11/24 12:06 PM [name] Hinton  MRN:  [id number]  Patient is a 63 year old female with a history of Bipolar Disorder who presents voluntarily to [facility] for assessment. Patient presents via [facility] via [facility] with chief complaint of suicide attempt. [employer] was contacted by patient's mother after patient place a note on the bathroom door stating: Mom do not open this door!  Just call 911, and let them come in here.  You do not need to see me.  I am sorry!  This is what you need to tell the 911 operator: Tell them I told you not to come in.  I have slit my wrist and bled out.  I am dead (I hope!).     Nov 24, 2024: Reports some anxiety issues but overall has been engaging well patient is assessed on the inpatient unit and ambulates to her room to speak with provider without assistance. She reports that she is doing well, endorses nicotine  cravings only and has a nicotine  patch on currently. She continues to be agreeable to substance abuse treatment. She continues to be medication compliant and denies concerns regarding her regimen. Endorses improvement in depressive symptoms and denies suicidal thoughts at this time. Has been attending groups and engaging appropriately. Sleeping and eating well., Denies SI, HI, AVH.    Past Psychiatric History: see h&P Family History:  Family History  Problem Relation Age of Onset   Breast cancer Neg Hx    Social History:  Social History   Substance and Sexual Activity  Alcohol  Use No     Social History   Substance and Sexual Activity  Drug Use No    Social History   Socioeconomic History   Marital status: Divorced    Spouse name: Not on file   Number of children: Not on file   Years of education: Not on file   Highest education level: Not on file  Occupational History   Not on file  Tobacco Use   Smoking status: Some Days    Types: Cigarettes   Smokeless tobacco: Never   Tobacco comments:    03/07/2024  patient smokes about 4-5 cigarettes some days    02/09/2023 A pack last patient about a week  Substance and Sexual Activity   Alcohol  use: No   Drug use: No   Sexual activity: Yes    Birth control/protection: Surgical  Other Topics Concern   Not on file  Social History Narrative   Not on file   Social Drivers of Health   Tobacco Use: High Risk (11/08/2024)   Patient History    Smoking Tobacco Use: Some Days    Smokeless Tobacco Use: Never    Passive Exposure: Not on file  Financial Resource Strain: Not on File (09/25/2022)   Received from [employer]    Financial Resource Strain: 0  Food Insecurity: No Food Insecurity (11/08/2024)   Epic    Worried About Programme Researcher, Broadcasting/film/video in the Last Year: Never true    Ran Out of Food in the Last Year: Never true  Transportation Needs: No Transportation Needs (11/08/2024)   Epic    Lack of Transportation (Medical): No    Lack of Transportation (Non-Medical): No  Physical Activity: Not on File (03/20/2022)   Received from [facility]   Physical Activity    Physical Activity: 0  Stress: Not on File (03/20/2022)   Received from [facility]   Stress  Stress: 0  Social Connections: Not on File (08/10/2023)   Received from [facility]   Social Connections    Connectedness: 0  Depression (PHQ2-9): Not on file  Alcohol  Screen: Low Risk (11/07/2024)   Alcohol  Screen    Last Alcohol  Screening Score (AUDIT): 1  Housing: Low Risk (11/08/2024)   Epic    Unable to Pay for Housing in the Last Year: No    Number of Times Moved in the Last Year: 0    Homeless in the Last Year: No  Utilities: Not At Risk (11/08/2024)   Epic    Threatened with loss of utilities: No  Health Literacy: Not on file   Past Medical History:  Past Medical History:  Diagnosis Date   Anxiety    Bipolar affective (HCC)    Chronic headaches    Depression    Hypercholesteremia    Hypertension    Restless leg syndrome     Past Surgical History:  Procedure  Laterality Date   ABDOMINAL HYSTERECTOMY     OOPHORECTOMY     TONSILLECTOMY      Current Medications: Current Facility-Administered Medications  Medication Dose Route Frequency Provider Last Rate Last Admin   acetaminophen  (TYLENOL ) tablet 650 mg  650 mg Oral Q6H PRN McLauchlin, Angela, NP   650 mg at 11/12/24 1949   alum & mag hydroxide-simeth (MAALOX/MYLANTA) 200-200-20 MG/5ML suspension 30 mL  30 mL Oral Q4H PRN McLauchlin, Angela, NP   30 mL at 11/12/24 1949   artificial tears ophthalmic solution 1 drop  1 drop Both Eyes TID PRN Donnelly Mellow, MD       aspirin  EC tablet 81 mg  81 mg Oral QHS Jadapalle, Sree, MD   81 mg at 11/12/24 2132   clonazePAM  (KLONOPIN ) tablet 0.5 mg  0.5 mg Oral QHS Jadapalle, Sree, MD   0.5 mg at 11/12/24 2131   clonazePAM  (KLONOPIN ) tablet 0.5 mg  0.5 mg Oral Daily PRN Jadapalle, Sree, MD       haloperidol  (HALDOL ) tablet 5 mg  5 mg Oral TID PRN McLauchlin, Angela, NP       And   diphenhydrAMINE  (BENADRYL ) capsule 50 mg  50 mg Oral TID PRN McLauchlin, Angela, NP       haloperidol  lactate (HALDOL ) injection 5 mg  5 mg Intramuscular TID PRN McLauchlin, Jon, NP       And   diphenhydrAMINE  (BENADRYL ) injection 50 mg  50 mg Intramuscular TID PRN McLauchlin, Angela, NP       And   LORazepam  (ATIVAN ) injection 2 mg  2 mg Intramuscular TID PRN McLauchlin, Jon, NP       haloperidol  lactate (HALDOL ) injection 10 mg  10 mg Intramuscular TID PRN McLauchlin, Angela, NP       And   diphenhydrAMINE  (BENADRYL ) injection 50 mg  50 mg Intramuscular TID PRN McLauchlin, Angela, NP       And   LORazepam  (ATIVAN ) injection 2 mg  2 mg Intramuscular TID PRN McLauchlin, Angela, NP       gabapentin  (NEURONTIN ) capsule 300 mg  300 mg Oral TID Jadapalle, Sree, MD   300 mg at 11/13/24 0948   spironolactone  (ALDACTONE ) tablet 25 mg  25 mg Oral Daily Greenwood, Howard F, RPH   25 mg at 11/13/24 9050   And   hydrochlorothiazide  (HYDRODIURIL ) tablet 25 mg  25 mg Oral Daily  Greenwood, Howard F, RPH   25 mg at 11/13/24 9051   hydrOXYzine  (ATARAX ) tablet 25 mg  25 mg  Oral TID PRN McLauchlin, Angela, NP   25 mg at 11/13/24 9043   irbesartan  (AVAPRO ) tablet 37.5 mg  37.5 mg Oral Daily Jadapalle, Sree, MD   37.5 mg at 11/12/24 0947   magnesium  hydroxide (MILK OF MAGNESIA) suspension 30 mL  30 mL Oral Daily PRN McLauchlin, Angela, NP       metoprolol  tartrate (LOPRESSOR ) tablet 25 mg  25 mg Oral BID PRN Jadapalle, Sree, MD   25 mg at 11/12/24 1949   multivitamin with minerals tablet 1 tablet  1 tablet Oral q morning Jadapalle, Sree, MD   1 tablet at 11/13/24 9050   naproxen  (NAPROSYN ) tablet 250 mg  250 mg Oral BID WC Jadapalle, Sree, MD   250 mg at 11/13/24 0802   nicotine  (NICODERM CQ  - dosed in mg/24 hours) patch 14 mg  14 mg Transdermal Daily Jadapalle, Sree, MD   14 mg at 11/13/24 9050   pantoprazole  (PROTONIX ) EC tablet 40 mg  40 mg Oral Daily PRN Jadapalle, Sree, MD       rOPINIRole  (REQUIP ) tablet 1 mg  1 mg Oral QHS Jadapalle, Sree, MD   1 mg at 11/12/24 2132   simvastatin  (ZOCOR ) tablet 20 mg  20 mg Oral QHS Jadapalle, Sree, MD   20 mg at 11/12/24 2132   traZODone  (DESYREL ) tablet 50 mg  50 mg Oral QHS PRN McLauchlin, Angela, NP   50 mg at 11/12/24 2131   venlafaxine  XR (EFFEXOR -XR) 24 hr capsule 75 mg  75 mg Oral Q breakfast Jadapalle, Sree, MD   75 mg at 11/13/24 9197    Lab Results:  No results found for this or any previous visit (from the past 48 hours).   Blood Alcohol  level:  Lab Results  Component Value Date   [facility] <15 11/07/2024    Metabolic Disorder Labs: No results found for: HGBA1C, MPG Lab Results  Component Value Date   PROLACTIN 8.7 11/07/2024   Lab Results  Component Value Date   CHOL 174 03/07/2024   TRIG 62 03/07/2024   HDL 62 03/07/2024   CHOLHDL 2.8 03/07/2024   VLDL 28 11/18/2007   LDLCALC 100 (H) 03/07/2024   LDLCALC (H) 11/18/2007    183        Total Cholesterol/HDL:CHD Risk Coronary Heart Disease Risk Table                      Men   Women  1/2 Average Risk   3.4   3.3    Physical Findings: AIMS:  , ,  ,  ,    CIWA:    COWS:      Psychiatric Specialty Exam:  Presentation  General Appearance:  Appropriate for Environment; Casual  Eye Contact: Minimal  Speech: Normal Rate  Speech Volume: Decreased    Mood and Affect  Mood: Anxious; Depressed  Affect: Depressed; Flat   Thought Process  Thought Processes: Coherent  Orientation:Full (Time, Place and Person)  Thought Content:Logical  Hallucinations: Denies  Ideas of Reference:None  Suicidal Thoughts: Denies active SI  Homicidal Thoughts: Denies   Sensorium  Memory: Immediate Fair; Remote Fair  Judgment: Impaired  Insight: Shallow   Executive Functions  Concentration: Fair  Attention Span: Fair  Recall: [employer] of Knowledge: Fair  Language: Fair   Psychomotor Activity  Psychomotor Activity: No data recorded  Musculoskeletal: Strength & Muscle Tone: within normal limits Gait & Station: normal Assets  Assets: Manufacturing Systems Engineer; Desire for Improvement    Physical  Exam: Physical Exam Review of Systems  Constitutional: Negative.   HENT: Negative.    Eyes: Negative.   Respiratory: Negative.    Cardiovascular: Negative.   Gastrointestinal: Negative.   Genitourinary: Negative.   Musculoskeletal: Negative.   Skin: Negative.   Neurological: Negative.   Endo/Heme/Allergies: Negative.   Psychiatric/Behavioral:  Positive for depression.    Blood pressure 99/68, pulse 82, temperature 97.6 F (36.4 C), resp. rate 15, height 5' 2 (1.575 m), weight 51.3 kg, SpO2 100%. Body mass index is 20.67 kg/m.  Diagnosis: Active Problems:   Bipolar I disorder, most recent episode depressed [facility])   Clinical Decision Making: Patient is currently admitted after a suicide attempt by cutting herself in the context of worsening depression and grieving after the loss of her father and boyfriend.   She will be monitored closely on the inpatient unit  Treatment Plan Summary:  Safety and Monitoring:             -- Voluntary admission to inpatient psychiatric unit for safety, stabilization and treatment             -- Daily contact with patient to assess and evaluate symptoms and progress in treatment             -- Patient's case to be discussed in multi-disciplinary team meeting             -- Observation Level: q15 minute checks             -- Vital signs:  q12 hours             -- Precautions: suicide, elopement, and assault   2. Psychiatric Diagnoses and Treatment:                Klonopin  0.5 mg nightly and 0.5 mg as needed daily  Effexor  XR 37.5 mg-increased to 75 mg   -- The risks/benefits/side-effects/alternatives to this medication were discussed in detail with the patient and time was given for questions. The patient consents to medication trial.                -- Metabolic profile and EKG monitoring obtained while on an atypical antipsychotic (BMI: Lipid Panel: HbgA1c: QTc:)              -- Encouraged patient to participate in unit milieu and in scheduled group therapies                            3. Medical Issues Being Addressed:   4. Discharge Planning:   -- Social work and case management to assist with discharge planning and identification of hospital follow-up needs prior to discharge  -- Estimated LOS: 3-4 days  Millie JONELLE Manners, MD 11/13/2024, 12:06 PM

## 2024-11-13 NOTE — Progress Notes (Signed)
°   11/12/24 2241  Psych Admission Type (Psych Patients Only)  Admission Status Voluntary  Psychosocial Assessment  Patient Complaints Nervousness  Eye Contact Fair  Facial Expression Other (Comment)  Affect Anxious  Speech Logical/coherent  Interaction Assertive;Intrusive  Motor Activity Slow  Appearance/Hygiene In scrubs  Behavior Characteristics Cooperative;Intrusive  Mood Depressed;Anxious  Thought Process  Coherency WDL  Content WDL  Delusions None reported or observed  Perception WDL  Hallucination None reported or observed  Judgment Impaired  Confusion None  Danger to Self  Current suicidal ideation? Denies  Agreement Not to Harm Self Yes  Description of Agreement verbal  Danger to Others  Danger to Others None reported or observed    Estimated Sleeping Duration (Last 24 Hours): 6.50-7.75 hours

## 2024-11-13 NOTE — Group Note (Signed)
 Date:  11/13/2024 Time:  9:57 PM  Group Topic/Focus:  Wrap-Up Group:   The focus of this group is to help patients review their daily goal of treatment and discuss progress on daily workbooks.    Participation Level:  Active  Participation Quality:  Appropriate  Affect:  Appropriate  Cognitive:  Alert  Insight: Appropriate  Engagement in Group:  Engaged  Modes of Intervention:  Discussion  Additional Comments:    [name] Hinton CHRISTELLA Bunker 11/13/2024, 9:57 PM

## 2024-11-14 NOTE — Group Note (Signed)
 Date:  11/14/2024 Time:  9:49 PM  Group Topic/Focus:  Wrap-Up Group:   The focus of this group is to help patients review their daily goal of treatment and discuss progress on daily workbooks.    Participation Level:  Active  Participation Quality:  Appropriate  Affect:  Appropriate  Cognitive:  Alert  Insight: Appropriate  Engagement in Group:  Engaged  Modes of Intervention:  Discussion  Additional Comments:    Tommas CHRISTELLA Bunker 11/14/2024, 9:49 PM

## 2024-11-14 NOTE — Plan of Care (Signed)
  Problem: Coping: Goal: Ability to identify and develop effective coping behavior will improve Outcome: Not Progressing   Problem: Self-Concept: Goal: Level of anxiety will decrease Outcome: Not Progressing Goal: Ability to modify response to factors that promote anxiety will improve Outcome: Not Progressing   

## 2024-11-14 NOTE — BH IP Treatment Plan (Signed)
 Interdisciplinary Treatment and Diagnostic Plan Update  11/14/2024 Time of Session: 9:00 AM [name] Hinton MRN: [id number]  Principal Diagnosis: Bipolar 1 disorder (HCC)  Secondary Diagnoses: Active Problems:   Bipolar I disorder, most recent episode depressed (HCC)   Current Medications:  Current Facility-Administered Medications  Medication Dose Route Frequency Provider Last Rate Last Admin   acetaminophen  (TYLENOL ) tablet 650 mg  650 mg Oral Q6H PRN McLauchlin, Angela, NP   650 mg at 11/13/24 1457   alum & mag hydroxide-simeth (MAALOX/MYLANTA) 200-200-20 MG/5ML suspension 30 mL  30 mL Oral Q4H PRN McLauchlin, Angela, NP   30 mL at 11/13/24 1457   artificial tears ophthalmic solution 1 drop  1 drop Both Eyes TID PRN Jadapalle, Sree, MD       aspirin  EC tablet 81 mg  81 mg Oral QHS Jadapalle, Sree, MD   81 mg at 11/13/24 2120   clonazePAM  (KLONOPIN ) tablet 0.5 mg  0.5 mg Oral QHS Jadapalle, Sree, MD   0.5 mg at 11/13/24 2120   clonazePAM  (KLONOPIN ) tablet 0.5 mg  0.5 mg Oral Daily PRN Jadapalle, Sree, MD   0.5 mg at 11/13/24 1457   haloperidol  (HALDOL ) tablet 5 mg  5 mg Oral TID PRN McLauchlin, Angela, NP       And   diphenhydrAMINE  (BENADRYL ) capsule 50 mg  50 mg Oral TID PRN McLauchlin, Angela, NP       haloperidol  lactate (HALDOL ) injection 5 mg  5 mg Intramuscular TID PRN McLauchlin, Jon, NP       And   diphenhydrAMINE  (BENADRYL ) injection 50 mg  50 mg Intramuscular TID PRN McLauchlin, Jon, NP       And   LORazepam  (ATIVAN ) injection 2 mg  2 mg Intramuscular TID PRN McLauchlin, Jon, NP       haloperidol  lactate (HALDOL ) injection 10 mg  10 mg Intramuscular TID PRN McLauchlin, Jon, NP       And   diphenhydrAMINE  (BENADRYL ) injection 50 mg  50 mg Intramuscular TID PRN McLauchlin, Angela, NP       And   LORazepam  (ATIVAN ) injection 2 mg  2 mg Intramuscular TID PRN McLauchlin, Angela, NP       gabapentin  (NEURONTIN ) capsule 400 mg  400 mg Oral TID Madaram, Kondal R, MD    400 mg at 11/14/24 9057   spironolactone  (ALDACTONE ) tablet 25 mg  25 mg Oral Daily Niels Kayla FALCON, RPH   25 mg at 11/14/24 9057   And   hydrochlorothiazide  (HYDRODIURIL ) tablet 25 mg  25 mg Oral Daily Greenwood, Howard F, RPH   25 mg at 11/14/24 9051   hydrOXYzine  (ATARAX ) tablet 25 mg  25 mg Oral TID PRN McLauchlin, Angela, NP   25 mg at 11/14/24 9271   irbesartan  (AVAPRO ) tablet 37.5 mg  37.5 mg Oral Daily Jadapalle, Sree, MD   37.5 mg at 11/14/24 9057   magnesium  hydroxide (MILK OF MAGNESIA) suspension 30 mL  30 mL Oral Daily PRN McLauchlin, Angela, NP       metoprolol  tartrate (LOPRESSOR ) tablet 25 mg  25 mg Oral BID PRN Jadapalle, Sree, MD   25 mg at 11/12/24 1949   multivitamin with minerals tablet 1 tablet  1 tablet Oral q morning Jadapalle, Sree, MD   1 tablet at 11/14/24 9057   naproxen  (NAPROSYN ) tablet 250 mg  250 mg Oral BID WC Jadapalle, Sree, MD   250 mg at 11/14/24 9271   nicotine  (NICODERM CQ  - dosed in mg/24 hours) patch 14 mg  14 mg Transdermal Daily Jadapalle, Sree, MD   14 mg at 11/14/24 0946   pantoprazole  (PROTONIX ) EC tablet 40 mg  40 mg Oral Daily PRN Jadapalle, Sree, MD       rOPINIRole  (REQUIP ) tablet 1 mg  1 mg Oral QHS Jadapalle, Sree, MD   1 mg at 11/13/24 2120   simvastatin  (ZOCOR ) tablet 20 mg  20 mg Oral QHS Jadapalle, Sree, MD   20 mg at 11/13/24 2120   traZODone  (DESYREL ) tablet 50 mg  50 mg Oral QHS PRN McLauchlin, Angela, NP   50 mg at 11/13/24 2120   venlafaxine  XR (EFFEXOR -XR) 24 hr capsule 75 mg  75 mg Oral Q breakfast Jadapalle, Sree, MD   75 mg at 11/14/24 9271   PTA Medications: Medications Prior to Admission  Medication Sig Dispense Refill Last Dose/Taking   ARTIFICIAL TEARS PF 0.1-0.3 % SOLN Place 1 drop into both eyes 3 (three) times daily as needed (for dryness).      aspirin  EC 81 MG tablet Take 81 mg by mouth at bedtime. Swallow whole.      Aspirin -Acetaminophen -Caffeine (GOODY HEADACHE PO) Take 1 packet by mouth as needed (for headaches).       buPROPion  (WELLBUTRIN  XL) 150 MG 24 hr tablet Take 150 mg by mouth every morning.      Calcium  Carb-Cholecalciferol 600-5 MG-MCG TABS Take 1 tablet by mouth daily.      clonazePAM  (KLONOPIN ) 0.5 MG tablet Take 0.5 mg by mouth 2 (two) times daily as needed for anxiety.      gabapentin  (NEURONTIN ) 300 MG capsule TAKE 1 CAPSULE BY MOUTH THREE TIMES A DAY 270 capsule 2    ibuprofen (ADVIL) 200 MG tablet Take 800 mg by mouth every 6 (six) hours as needed for headache or mild pain.      metoprolol  tartrate (LOPRESSOR ) 50 MG tablet TAKE 1 TABLET BY MOUTH 2 TIMES DAILY AS NEEDED (MAY TAKE 50MG  1-2 TIMES DAILY FOR IRREGULAR HEART BEAT). 180 tablet 1    Multiple Vitamin (MULTIVITAMIN WITH MINERALS) TABS Take 1 tablet by mouth every morning.      naproxen  sodium (ALEVE ) 220 MG tablet Take 220-440 mg by mouth 2 (two) times daily as needed (for mild pain or headaches).      omeprazole (PRILOSEC OTC) 20 MG tablet Take 20 mg by mouth daily as needed (for reflux).      rOPINIRole  (REQUIP ) 1 MG tablet Take 1 mg by mouth at bedtime.      simvastatin  (ZOCOR ) 20 MG tablet Take 20 mg by mouth at bedtime.      spironolactone -hydrochlorothiazide  (ALDACTAZIDE) 25-25 MG tablet Take 1 tablet by mouth daily. 30 tablet 11    TYLENOL  8 HOUR 650 MG CR tablet Take 650-1,300 mg by mouth every 8 (eight) hours as needed for pain (or headaches).      valsartan (DIOVAN) 320 MG tablet Take 320 mg by mouth daily.      VRAYLAR  1.5 MG capsule Take 1.5 mg by mouth daily.       Patient Stressors:    Patient Strengths:    Treatment Modalities: Medication Management, Group therapy, Case management,  1 to 1 session with clinician, Psychoeducation, Recreational therapy.   Physician Treatment Plan for Primary Diagnosis: Bipolar 1 disorder (HCC) Long Term Goal(s): Improvement in symptoms so as ready for discharge   Short Term Goals: Ability to identify changes in lifestyle to reduce recurrence of condition will improve Ability to  verbalize feelings will improve Ability to disclose  and discuss suicidal ideas Ability to demonstrate self-control will improve Ability to identify and develop effective coping behaviors will improve  Medication Management: Evaluate patient's response, side effects, and tolerance of medication regimen.  Therapeutic Interventions: 1 to 1 sessions, Unit Group sessions and Medication administration.  Evaluation of Outcomes: Progressing  Physician Treatment Plan for Secondary Diagnosis: Active Problems:   Bipolar I disorder, most recent episode depressed (HCC)  Long Term Goal(s): Improvement in symptoms so as ready for discharge   Short Term Goals: Ability to identify changes in lifestyle to reduce recurrence of condition will improve Ability to verbalize feelings will improve Ability to disclose and discuss suicidal ideas Ability to demonstrate self-control will improve Ability to identify and develop effective coping behaviors will improve     Medication Management: Evaluate patient's response, side effects, and tolerance of medication regimen.  Therapeutic Interventions: 1 to 1 sessions, Unit Group sessions and Medication administration.  Evaluation of Outcomes: Progressing   RN Treatment Plan for Primary Diagnosis: Bipolar 1 disorder (HCC) Long Term Goal(s): Knowledge of disease and therapeutic regimen to maintain health will improve  Short Term Goals:  Ability to remain free from injury will improve, Ability to verbalize frustration and anger appropriately will improve, Ability to demonstrate self-control, Ability to participate in decision making will improve, Ability to verbalize feelings will improve, Ability to disclose and discuss suicidal ideas, Ability to identify and develop effective coping behaviors will improve, and Compliance with prescribed medications will improve   Medication Management: RN will administer medications as ordered by provider, will assess and evaluate  patient's response and provide education to patient for prescribed medication. RN will report any adverse and/or side effects to prescribing provider.  Therapeutic Interventions: 1 on 1 counseling sessions, Psychoeducation, Medication administration, Evaluate responses to treatment, Monitor vital signs and CBGs as ordered, Perform/monitor CIWA, COWS, AIMS and Fall Risk screenings as ordered, Perform wound care treatments as ordered.  Evaluation of Outcomes: Progressing   LCSW Treatment Plan for Primary Diagnosis: Bipolar 1 disorder (HCC) Long Term Goal(s): Safe transition to appropriate next level of care at discharge, Engage patient in therapeutic group addressing interpersonal concerns.  Short Term Goals: Engage patient in aftercare planning with referrals and resources, Increase social support, Increase ability to appropriately verbalize feelings, Increase emotional regulation, Facilitate acceptance of mental health diagnosis and concerns, Facilitate patient progression through stages of change regarding substance use diagnoses and concerns, Identify triggers associated with mental health/substance abuse issues, and Increase skills for wellness and recovery   Therapeutic Interventions: Assess for all discharge needs, 1 to 1 time with Social worker, Explore available resources and support systems, Assess for adequacy in community support network, Educate family and significant other(s) on suicide prevention, Complete Psychosocial Assessment, Interpersonal group therapy.  Evaluation of Outcomes: Progressing   Progress in Treatment: Attending groups: No. 11/14/24 Update: Yes. And No.  Participating in groups: No. 11/14/24 Update: Yes. And No.  Taking medication as prescribed: Yes. 11/14/24 Update: Yes. Toleration medication: Yes. 11/14/24 Update: Yes. Family/Significant other contact made: No, will contact:  CSW will contact if given permission 11/14/24 Update: Yes. Dickey Ramus, mother,  [phone]. Mother declined SPE.  Patient understands diagnosis: Yes. 11/14/24 Update: Yes. Discussing patient identified problems/goals with staff: Yes. 11/14/24 Update: Yes. Medical problems stabilized or resolved: Yes. 11/14/24 Update: Yes. Denies suicidal/homicidal ideation: No. 11/14/24 Update: No.  Issues/concerns per patient self-inventory: No. 11/14/24 Update: No. Other: None 11/14/24 Update: None.   New problem(s) identified: No, Describe:  None identified 11/14/24 Update: None.  New Short Term/Long Term Goal(s):  elimination of symptoms of psychosis, medication management for mood stabilization; elimination of SI thoughts; development of comprehensive mental wellness plan. 11/14/24 Update: No changes at this time.    Patient Goals:  Get back on my medications and clear my head of the thoughts of not wanting to live 11/14/24 Update: No changes at this time.    Discharge Plan or Barriers: CSW will assist with appropriate discharge planning 11/14/24 Update: No changes at this time. CSW to continue to engage with team and patient for safe discharge planning.    Reason for Continuation of Hospitalization: Depression Medication stabilization Suicidal ideation 11/14/24 Update: No changes at this time.    Estimated Length of Stay: 1 to 7 days 11/14/24 Update: TBD.  Last 3 [place] Suicide Severity Risk Score: Flowsheet Row Admission (Current) from 11/08/2024 in [facility] from [facility]/07/2024 in [facility]  C-SSRS RISK CATEGORY High Risk High Risk Low Risk    Last PHQ 2/9 Scores:    10/02/2021    3:54 PM  Depression screen PHQ 2/9  Decreased Interest 3  Down, Depressed, Hopeless 3  PHQ - 2 Score 6  Altered sleeping 3  Tired, decreased energy 3  Change in appetite 0  Feeling bad or failure about yourself  3  Trouble concentrating 3  Moving slowly or  fidgety/restless 0  Suicidal thoughts 3  PHQ-9 Score 21   Difficult doing work/chores Somewhat difficult     Data saved with a previous flowsheet row definition    Scribe for Treatment Team: Ahni Bradwell M Yuki Purves, LCSW 11/14/2024 3:30 PM

## 2024-11-14 NOTE — Progress Notes (Signed)
 D- Patient alert and oriented x 98. Pt denies SI, HI, AVH, and pain. Pt endorsing 4/10. Pt states she did not sleep well but normally does. Provider made aware. Pt states she would like to be discharged. Possible discharge ETA pending awaiting [facility] confirmation.   A- Scheduled/PRN medications administered to patient, per MD orders. Support and encouragement provided.  Routine safety checks conducted every 15 minutes.  Patient informed to notify staff with problems or concerns.  R- No adverse drug reactions noted. Patient contracts for safety at this time. Patient compliant with medications and treatment plan. Patient receptive, calm, and cooperative. Patient interacts well with others on the unit.  Patient remains safe at this time.    Adian Jablonowski S.,RN

## 2024-11-14 NOTE — Progress Notes (Signed)
°   11/13/24 2329  Psych Admission Type (Psych Patients Only)  Admission Status Voluntary  Psychosocial Assessment  Patient Complaints Nervousness  Eye Contact Fair  Facial Expression Other (Comment)  Affect Sad  Speech Logical/coherent  Interaction Assertive  Motor Activity Slow  Appearance/Hygiene In scrubs  Behavior Characteristics Cooperative  Mood Anxious;Depressed  Thought Process  Coherency WDL  Content WDL  Delusions None reported or observed  Perception WDL  Hallucination None reported or observed  Judgment WDL  Confusion None  Danger to Self  Current suicidal ideation? Denies  Agreement Not to Harm Self Yes  Description of Agreement verbal  Danger to Others  Danger to Others None reported or observed   Estimated Sleeping Duration (Last 24 Hours): 6.75-9.25 hours

## 2024-11-14 NOTE — Group Note (Signed)
 Date:  11/14/2024 Time:  12:09 PM  Group Topic/Focus:   Coping With Mental Health Crisis:   The purpose of this group is to help patients identify strategies for coping with mental health crisis.  Group discusses possible causes of crisis and ways to manage them effectively. Personal Choices and Values:   The focus of this group is to help patients assess and explore the importance of values in their lives, how their values affect their decisions, how they express their values and what opposes their expression.  The patients had the opportunity to create vision boards and set goals for the upcoming new year. We discussed new goals and dreams, along with realistic time frames for accomplishing them. This activity encouraged reflection, motivation, and hope. It also served as a print production planner experience and promoted positivity and a more optimistic outlook on life.    Participation Level:  Active  Participation Quality:  Appropriate  Affect:  Appropriate  Cognitive:  Appropriate  Insight: Appropriate  Engagement in Group:  Engaged  Modes of Intervention:  Activity and Discussion  Additional Comments:    Ommie Degeorge L Jaselynn Tamas 11/14/2024, 12:09 PM

## 2024-11-14 NOTE — Group Note (Signed)
 Physical/Occupational Therapy Group Note  Group Topic: Yoga  Group Date: 11/14/2024 Start Time: 1500 End Time: 1530 Facilitators: Tashai Catino, Alm Hamilton, PT   Group Description: Group participated with series of yoga poses, designed to emphasize functional sitting balance, core stability, generalized flexibility and overall posture.  Incorporated deep breathing techniques with poses, working to promote relaxation, mindfulness and focus with targeted activities.  Discussed benefits of yoga in improving mood and self-esteem, reducing stress and anxiety, and promoting functional strength, balance and core stability for each participant.  Discussed ways to integrate into each participants daily routine.  Provided handout with written and pictorial descriptions of included yoga movements to be utilized as appropriate outside of group time.  Therapeutic Goal(s):  Demonstrate safe ability to participate with yoga poses during group activity. Identify one benefit of participation with yoga poses as part of each participants exercise/movement routine. Identify 1-2 individual poses that participant feels most beneficial to his/her needs and that he/she can easily replicate outside of group.  Individual Participation:  Pt actively participated with the discussion and physical activity components of the session and was able with min cuing to modify poses as needed for patient comfort.  Participation Level: Active and Engaged   Participation Quality: Minimal Cues   Behavior: Appropriate   Speech/Thought Process: Coherent and Focused   Affect/Mood: Appropriate   Insight: Moderate   Judgement: Moderate   Modes of Intervention: Activity and Discussion  Patient Response to Interventions:  Attentive, Engaged, and Interested    Plan: Continue to engage patient in PT/OT groups 1 - 2x/week.  CHARM Hamilton Bertin PT, DPT 11/14/2024, 5:09 PM

## 2024-11-14 NOTE — Group Note (Signed)
 Recreation Therapy Group Note   Group Topic:Emotion Expression  Group Date: 11/14/2024 Start Time: 1410 End Time: 1500 Facilitators: Celestia Jeoffrey BRAVO, LRT, CTRS Location: Craft Room  Group Description: Positivity Collage. LRT and patients discussed the importance of having a positive mindset and being happy. Patients received magazines, safety scissors, a glue stick and a piece of paper. Pts were encouraged to find images or words in the magazines that showed happiness or positivity to them. Pt shared their collage with the group once they were finished. LRT and pts discussed how it can be difficult to always have a positive mindset, especially when they have mental health challenges.   Goal Area(s) Addressed:  Pt will identify things associate with positivity. Pt will reduce negative thinking. Pt will identify a new coping skill of thinking positive thoughts.    Affect/Mood: Appropriate   Participation Level: Active and Engaged   Participation Quality: Independent   Behavior: Calm and Cooperative   Speech/Thought Process: Coherent   Insight: Good   Judgement: Good   Modes of Intervention: Art, Education, and Exploration   Patient Response to Interventions:  Attentive, Engaged, Interested , and Receptive   Education Outcome:  Acknowledges education   Clinical Observations/Individualized Feedback: [name] Hinton was active in their participation of session activities and group discussion. Pt identified all animals and plants as positivity and things that make her happy.   Plan: Continue to engage patient in RT group sessions 2-3x/week.   Jeoffrey BRAVO Celestia, LRT, CTRS 11/14/2024 4:33 PM

## 2024-11-14 NOTE — Plan of Care (Signed)
  Problem: Coping: Goal: Ability to identify and develop effective coping behavior will improve Outcome: Progressing   

## 2024-11-15 ENCOUNTER — Inpatient Hospital Stay

## 2024-11-15 DIAGNOSIS — F319 Bipolar disorder, unspecified: Secondary | ICD-10-CM | POA: Diagnosis not present

## 2024-11-15 MED ORDER — VENLAFAXINE HCL ER 75 MG PO CP24
150.0000 mg | ORAL_CAPSULE | Freq: Every day | ORAL | Status: DC
  Administered 2024-11-16 – 2024-11-28 (×13): 150 mg via ORAL
  Filled 2024-11-15 (×12): qty 2

## 2024-11-15 NOTE — Plan of Care (Signed)
   Problem: Activity: Goal: Interest or engagement in activities will improve Outcome: Progressing   Problem: Safety: Goal: Periods of time without injury will increase Outcome: Progressing   Problem: Activity: Goal: Interest or engagement in leisure activities will improve Outcome: Progressing

## 2024-11-15 NOTE — Group Note (Signed)
 Date:  11/15/2024 Time:  4:00 PM  Group Topic/Focus:  Healthy Communication:   The focus of this group is to discuss communication, barriers to communication, as well as healthy ways to communicate with others. Making Healthy Choices:   The focus of this group is to help patients identify negative/unhealthy choices they were using prior to admission and identify positive/healthier coping strategies to replace them upon discharge.  I provided structured worksheets designed to promote cognitive engagement, social interaction, and conversation. Worksheets included activities that encouraged critical thinking and verbal expression, such as This or That (Would You Rather) questions and prompts focused on favorite memories. The group reviewed the worksheet together, with questions read aloud to support comprehension and inclusion.  In addition to the worksheet activities, the group participated in Guess That Song, which encouraged memory recall, attention, and peer interaction. An acts of kindness discussion was also facilitated, allowing participants to reflect on positive behaviors and share personal experiences.  Participants demonstrated engagement through verbal responses, shared memories, and interaction with peers. The group setting promoted socialization, reminiscence, and positive affect. Modifications were provided as needed, including verbal prompting and allowing responses to be given verbally rather than written.  The session supported cognitive stimulation, emotional expression, and group cohesion in a supportive environment.    Participation Level:  Active  Participation Quality:  Appropriate  Affect:  Appropriate  Cognitive:  Appropriate  Insight: Appropriate  Engagement in Group:  Engaged  Modes of Intervention:  Activity and Discussion  Additional Comments:    [name] Hinton L [name] Hinton 11/15/2024, 4:00 PM

## 2024-11-15 NOTE — Progress Notes (Signed)
 [facility] MD Progress Note  11/15/2024 12:15 PM [name] Hinton  MRN:  [id number]  Patient is a 63 year old female with a history of Bipolar Disorder who presents voluntarily to [facility] for assessment. Patient presents via [facility] via [employer] with chief complaint of suicide attempt. [employer] was contacted by patient's mother after patient place a note on the bathroom door stating: Mom do not open this door!  Just call 911, and let them come in here.  You do not need to see me.  I am sorry!  This is what you need to tell the 911 operator: Tell them I told you not to come in.  I have slit my wrist and bled out.  I am dead (I hope!).    Patient endorses having suicidal thoughts for the past week and she confirms that today she locked herself in the bathroom with a knife and started to cut her wrist. Client also reported overdosing on 1400mg  of her heart medications a week ago.Patient is admitted to [facility] unit with Q15 min safety monitoring. Multidisciplinary team approach is offered. Medication management; group/milieu therapy is offered.  Subjective:  Chart reviewed, case discussed in multidisciplinary meeting, patient seen during rounds.   The morning patient had a fall as she reports getting out of the bed, feeling dizzy and blacking out.  She hit her head and CT scan head was conducted.  Patient was awake, alert oriented x 4, no obvious swelling or hematoma on the head.  Patient vitals were stable throughout the day.  Patient reports her depression as 5 out of 10, 10 being the worst and anxiety is improving.  Patient denies active SI/HI/plan and denies hallucinations.  She remains very anxious and frustrated about her homeless situation.  She wants to work with the child psychotherapist to look into sober living opportunities as she reports safety concerns being on the streets by herself.  She denies having any other legal next of kin or family or friends to help her out with her living situation. Past  Psychiatric History: see h&P Family History:  Family History  Problem Relation Age of Onset   Breast cancer Neg Hx    Social History:  Social History   Substance and Sexual Activity  Alcohol  Use No     Social History   Substance and Sexual Activity  Drug Use No    Social History   Socioeconomic History   Marital status: Divorced    Spouse name: Not on file   Number of children: Not on file   Years of education: Not on file   Highest education level: Not on file  Occupational History   Not on file  Tobacco Use   Smoking status: Some Days    Types: Cigarettes   Smokeless tobacco: Never   Tobacco comments:    03/07/2024 patient smokes about 4-5 cigarettes some days    02/09/2023 A pack last patient about a week  Substance and Sexual Activity   Alcohol  use: No   Drug use: No   Sexual activity: Yes    Birth control/protection: Surgical  Other Topics Concern   Not on file  Social History Narrative   Not on file   Social Drivers of Health   Tobacco Use: High Risk (11/08/2024)   Patient History    Smoking Tobacco Use: Some Days    Smokeless Tobacco Use: Never    Passive Exposure: Not on file  Financial Resource Strain: Not on File (09/25/2022)  Received from [employer] Resource Strain: 0  Food Insecurity: No Food Insecurity (11/08/2024)   Epic    Worried About Programme Researcher, Broadcasting/film/video in the Last Year: Never true    Ran Out of Food in the Last Year: Never true  Transportation Needs: No Transportation Needs (11/08/2024)   Epic    Lack of Transportation (Medical): No    Lack of Transportation (Non-Medical): No  Physical Activity: Not on File (03/20/2022)   Received from [facility]   Physical Activity    Physical Activity: 0  Stress: Not on File (03/20/2022)   Received from [facility]   Stress    Stress: 0  Social Connections: Not on File (08/10/2023)   Received from [facility]   Social Connections    Connectedness: 0  Depression (PHQ2-9): Not  on file  Alcohol  Screen: Low Risk (11/07/2024)   Alcohol  Screen    Last Alcohol  Screening Score (AUDIT): 1  Housing: Low Risk (11/08/2024)   Epic    Unable to Pay for Housing in the Last Year: No    Number of Times Moved in the Last Year: 0    Homeless in the Last Year: No  Utilities: Not At Risk (11/08/2024)   Epic    Threatened with loss of utilities: No  Health Literacy: Not on file   Past Medical History:  Past Medical History:  Diagnosis Date   Anxiety    Bipolar affective (HCC)    Chronic headaches    Depression    Hypercholesteremia    Hypertension    Restless leg syndrome     Past Surgical History:  Procedure Laterality Date   ABDOMINAL HYSTERECTOMY     OOPHORECTOMY     TONSILLECTOMY      Current Medications: Current Facility-Administered Medications  Medication Dose Route Frequency Provider Last Rate Last Admin   acetaminophen  (TYLENOL ) tablet 650 mg  650 mg Oral Q6H PRN McLauchlin, Angela, NP   650 mg at 11/14/24 1715   alum & mag hydroxide-simeth (MAALOX/MYLANTA) 200-200-20 MG/5ML suspension 30 mL  30 mL Oral Q4H PRN McLauchlin, Angela, NP   30 mL at 11/13/24 1457   artificial tears ophthalmic solution 1 drop  1 drop Both Eyes TID PRN Colleen Kotlarz, MD       aspirin  EC tablet 81 mg  81 mg Oral QHS Lucia Harm, MD   81 mg at 11/14/24 2134   clonazePAM  (KLONOPIN ) tablet 0.5 mg  0.5 mg Oral QHS Shawndra Clute, MD   0.5 mg at 11/14/24 2135   clonazePAM  (KLONOPIN ) tablet 0.5 mg  0.5 mg Oral Daily PRN Rasheen Schewe, MD   0.5 mg at 11/14/24 1646   haloperidol  (HALDOL ) tablet 5 mg  5 mg Oral TID PRN McLauchlin, Angela, NP       And   diphenhydrAMINE  (BENADRYL ) capsule 50 mg  50 mg Oral TID PRN McLauchlin, Jon, NP       haloperidol  lactate (HALDOL ) injection 5 mg  5 mg Intramuscular TID PRN McLauchlin, Angela, NP       And   diphenhydrAMINE  (BENADRYL ) injection 50 mg  50 mg Intramuscular TID PRN McLauchlin, Angela, NP       And   LORazepam  (ATIVAN ) injection  2 mg  2 mg Intramuscular TID PRN McLauchlin, Angela, NP       haloperidol  lactate (HALDOL ) injection 10 mg  10 mg Intramuscular TID PRN McLauchlin, Jon, NP       And   diphenhydrAMINE  (  BENADRYL ) injection 50 mg  50 mg Intramuscular TID PRN McLauchlin, Angela, NP       And   LORazepam  (ATIVAN ) injection 2 mg  2 mg Intramuscular TID PRN McLauchlin, Angela, NP       gabapentin  (NEURONTIN ) capsule 400 mg  400 mg Oral TID Madaram, Kondal R, MD   400 mg at 11/15/24 0935   spironolactone  (ALDACTONE ) tablet 25 mg  25 mg Oral Daily Niels Kayla FALCON, RPH   25 mg at 11/15/24 9064   And   hydrochlorothiazide  (HYDRODIURIL ) tablet 25 mg  25 mg Oral Daily Greenwood, Howard F, RPH   25 mg at 11/15/24 0935   hydrOXYzine  (ATARAX ) tablet 25 mg  25 mg Oral TID PRN McLauchlin, Angela, NP   25 mg at 11/14/24 9271   irbesartan  (AVAPRO ) tablet 37.5 mg  37.5 mg Oral Daily Zamari Bonsall, MD   37.5 mg at 11/15/24 9065   magnesium  hydroxide (MILK OF MAGNESIA) suspension 30 mL  30 mL Oral Daily PRN McLauchlin, Angela, NP       metoprolol  tartrate (LOPRESSOR ) tablet 25 mg  25 mg Oral BID PRN Robertlee Rogacki, MD   25 mg at 11/12/24 1949   multivitamin with minerals tablet 1 tablet  1 tablet Oral q morning Tashiana Lamarca, MD   1 tablet at 11/15/24 9065   naproxen  (NAPROSYN ) tablet 250 mg  250 mg Oral BID WC Aking Klabunde, MD   250 mg at 11/15/24 0805   nicotine  (NICODERM CQ  - dosed in mg/24 hours) patch 14 mg  14 mg Transdermal Daily Erie Radu, MD   14 mg at 11/15/24 0935   pantoprazole  (PROTONIX ) EC tablet 40 mg  40 mg Oral Daily PRN Lanis Storlie, MD       rOPINIRole  (REQUIP ) tablet 1 mg  1 mg Oral QHS Aparna Vanderweele, MD   1 mg at 11/14/24 2135   simvastatin  (ZOCOR ) tablet 20 mg  20 mg Oral QHS Kalie Cabral, MD   20 mg at 11/14/24 2135   traZODone  (DESYREL ) tablet 50 mg  50 mg Oral QHS PRN McLauchlin, Angela, NP   50 mg at 11/14/24 2139   [START ON 11/16/2024] venlafaxine  XR (EFFEXOR -XR) 24 hr capsule  150 mg  150 mg Oral Q breakfast Elzy Tomasello, MD        Lab Results:  No results found for this or any previous visit (from the past 48 hours).   Blood Alcohol  level:  Lab Results  Component Value Date   [facility] <15 11/07/2024    Metabolic Disorder Labs: No results found for: HGBA1C, MPG Lab Results  Component Value Date   PROLACTIN 8.7 11/07/2024   Lab Results  Component Value Date   CHOL 174 03/07/2024   TRIG 62 03/07/2024   HDL 62 03/07/2024   CHOLHDL 2.8 03/07/2024   VLDL 28 11/18/2007   LDLCALC 100 (H) 03/07/2024   LDLCALC (H) 11/18/2007    183        Total Cholesterol/HDL:CHD Risk Coronary Heart Disease Risk Table                     Men   Women  1/2 Average Risk   3.4   3.3    Physical Findings: AIMS:  , ,  ,  ,    CIWA:    COWS:      Psychiatric Specialty Exam:  Presentation  General Appearance:  Appropriate for Environment; Casual  Eye Contact: Minimal  Speech: Normal Rate  Speech  Volume: Decreased    Mood and Affect  Mood: Anxious; Depressed  Affect: Depressed; Flat   Thought Process  Thought Processes: Coherent  Orientation:Full (Time, Place and Person)  Thought Content:Logical  Hallucinations: Denies  Ideas of Reference:None  Suicidal Thoughts: Denies active SI  Homicidal Thoughts: Denies   Sensorium  Memory: Immediate Fair; Remote Fair  Judgment: Impaired  Insight: Shallow   Executive Functions  Concentration: Fair  Attention Span: Fair  Recall: [employer] of Knowledge: Fair  Language: Fair   Psychomotor Activity  Psychomotor Activity: No data recorded  Musculoskeletal: Strength & Muscle Tone: within normal limits Gait & Station: normal Assets  Assets: Manufacturing Systems Engineer; Desire for Improvement    Physical Exam: Physical Exam Vitals and nursing note reviewed.    ROS Blood pressure 105/71, pulse (!) 103, temperature 97.9 F (36.6 C), resp. rate 19, height 5' 2 (1.575  m), weight 51.3 kg, SpO2 95%. Body mass index is 20.67 kg/m.  Diagnosis: Active Problems:   Bipolar I disorder, most recent episode depressed [facility])   Clinical Decision Making: Patient is currently admitted after a suicide attempt by cutting herself in the context of worsening depression and grieving after the loss of her father and boyfriend.  She will be monitored closely on the inpatient unit  Treatment Plan Summary:  Safety and Monitoring:             -- Voluntary admission to inpatient psychiatric unit for safety, stabilization and treatment             -- Daily contact with patient to assess and evaluate symptoms and progress in treatment             -- Patient's case to be discussed in multi-disciplinary team meeting             -- Observation Level: q15 minute checks             -- Vital signs:  q12 hours             -- Precautions: suicide, elopement, and assault   2. Psychiatric Diagnoses and Treatment:                Klonopin  0.5 mg nightly and 0.5 mg as needed daily  Effexor  XR increased to 150 mg daily   -- The risks/benefits/side-effects/alternatives to this medication were discussed in detail with the patient and time was given for questions. The patient consents to medication trial.                -- Metabolic profile and EKG monitoring obtained while on an atypical antipsychotic (BMI: Lipid Panel: HbgA1c: QTc:)              -- Encouraged patient to participate in unit milieu and in scheduled group therapies                            3. Medical Issues Being Addressed:   4. Discharge Planning:   -- Social work and case management to assist with discharge planning and identification of hospital follow-up needs prior to discharge  -- Estimated LOS: 3-4 days  Allyn Foil, MD 11/15/2024, 12:15 PM

## 2024-11-15 NOTE — Progress Notes (Signed)
°   11/14/24 2016  Psych Admission Type (Psych Patients Only)  Admission Status Voluntary  Psychosocial Assessment  Patient Complaints Anxiety;Depression  Eye Contact Fair  Facial Expression Animated  Affect Appropriate to circumstance  Speech Logical/coherent  Interaction Assertive  Motor Activity Slow  Appearance/Hygiene Unremarkable  Behavior Characteristics Cooperative;Appropriate to situation;Calm  Mood Pleasant  Thought Process  Coherency WDL  Content WDL  Delusions None reported or observed  Perception WDL  Hallucination None reported or observed  Judgment WDL  Confusion None  Danger to Self  Current suicidal ideation? Denies  Agreement Not to Harm Self Yes  Description of Agreement Verbal  Danger to Others  Danger to Others None reported or observed

## 2024-11-15 NOTE — Progress Notes (Signed)
 Patient has returned from CT

## 2024-11-15 NOTE — Progress Notes (Signed)
 Pt tearful and visibly upset, stating she needs to leave and go home to her mother. Upon further discussion, pt reported her mother was at home with pt's sister. Support and encouragement provided. Later, heard a loud noise from pt's room. Pt found lying across the bed and banged her head against bed mattress. Pt redirected for safety. Verbalized understanding. Vistaril  25 mg po given for anxiety. No apparent injuries noted at this. No c/o pain/discomfort noted.

## 2024-11-15 NOTE — Progress Notes (Signed)
 Upon entry into patient's room at approx 1130, patient was found awake, lying on her side on the floor. Patient appears in no acute distress. When asked patient what made her fall, patient responded, I was kneeling down on my knees sorting through my papers then I went to stand to my feet and got lightheaded so I squatted and fell over. Patient states that she hit the side of her head on floor when she fell. Patient stated that something must be wrong since she is diagnosed with afib. Speech clear, skin warm/dry/intact. A+O x4. No loss of consciousness noted or reported. Pupils round and reactive to light. No signs of facial droop. Raises arms on command without drift. Moves all extremities well with strong bilat hand grips and pedal pushes. Appropriate range of motion of all extremities with 2/10 neck pain reported. No abrasions, bruising, or skin tears noted. Scalp palpation revealed no nodules or abrasions. Instructed patient to inform staff when ready to ambulate. Educated patient to alert staff if she develops a headache, nausea or vomiting. Verbalized understanding. Provider notified.

## 2024-11-15 NOTE — BHH Counselor (Signed)
 CSW provided information for [employer] ([place] [employer]) as well as [employer] and [employer] [facility]   [place], MSW, [place] 11/15/2024 3:18 PM

## 2024-11-15 NOTE — Progress Notes (Signed)
°   11/15/24 1600  Psych Admission Type (Psych Patients Only)  Admission Status Voluntary  Psychosocial Assessment  Patient Complaints Anxiety  Eye Contact Fair  Facial Expression Animated  Affect Appropriate to circumstance  Speech Logical/coherent  Interaction Assertive  Motor Activity Unsteady  Appearance/Hygiene Unremarkable  Behavior Characteristics Cooperative  Mood Pleasant  Thought Process  Coherency WDL  Content WDL  Delusions None reported or observed  Perception WDL  Hallucination None reported or observed  Judgment WDL  Confusion None  Danger to Self  Current suicidal ideation? Denies  Danger to Others  Danger to Others None reported or observed    RESULTS FROM HEAD CT W/O CONTRAST:  ORBITS: No acute abnormality.   SINUSES: No acute abnormality.   SOFT TISSUES AND SKULL: No acute soft tissue abnormality. No skull fracture.   IMPRESSION: 1. No acute intracranial abnormality. 2. Severe chronic small vessel ischemic disease.

## 2024-11-15 NOTE — Group Note (Signed)
 Recreation Therapy Group Note   Group Topic:General Recreation  Group Date: 11/15/2024 Start Time: 1405 End Time: 1450 Facilitators: Celestia Jeoffrey BRAVO, LRT, CTRS Location: Courtyard  Group Description: [employer]. LRT and patients played games of basketball, drew with chalk, and played corn hole while outside in the courtyard while getting fresh air and sunlight. Music was being played in the background. LRT and peers conversed about different games they have played before, what they do in their free time and anything else that is on their minds. LRT encouraged pts to drink water after being outside, sweating and getting their heart rate up.  Goal Area(s) Addressed: Patient will build on frustration tolerance skills. Patients will partake in a competitive play game with peers. Patients will gain knowledge of new leisure interest/hobby.    Affect/Mood: Appropriate   Participation Level: Active and Engaged   Participation Quality: Independent   Behavior: Calm and Cooperative   Speech/Thought Process: Coherent   Insight: Good   Judgement: Good   Modes of Intervention: Activity, Music, Rapport Building, and Socialization   Patient Response to Interventions:  Attentive, Engaged, Interested , and Receptive   Education Outcome:  Acknowledges education   Clinical Observations/Individualized Feedback: Kenzleigh was active in their participation of session activities and group discussion. Pt interacted well with LRT and peers duration of session.    Plan: Continue to engage patient in RT group sessions 2-3x/week.   Jeoffrey BRAVO Celestia, LRT, CTRS 11/15/2024 4:34 PM

## 2024-11-15 NOTE — Group Note (Signed)
 LCSW Group Therapy Note   Group Date: 11/15/2024 Start Time: 1315 End Time: 1330   Type of Therapy and Topic:  Group Therapy: Boundaries  Participation Level:  Active  Description of Group: This group will address the use of boundaries in their personal lives. Patients will explore why boundaries are important, the difference between healthy and unhealthy boundaries, and negative and postive outcomes of different boundaries and will look at how boundaries can be crossed.  Patients will be encouraged to identify current boundaries in their own lives and identify what kind of boundary is being set. Facilitators will guide patients in utilizing problem-solving interventions to address and correct types boundaries being used and to address when no boundary is being used. Understanding and applying boundaries will be explored and addressed for obtaining and maintaining a balanced life. Patients will be encouraged to explore ways to assertively make their boundaries and needs known to significant others in their lives, using other group members and facilitator for role play, support, and feedback.  Therapeutic Goals:  1.  Patient will identify areas in their life where setting clear boundaries could be  used to improve their life.  2.  Patient will identify signs/triggers that a boundary is not being respected. 3.  Patient will identify two ways to set boundaries in order to achieve balance in  their lives: 4.  Patient will demonstrate ability to communicate their needs and set boundaries  through discussion and/or role plays  Summary of Patient Progress:  [name] Hinton was present/active throughout the session and proved open to feedback from CSW and peers. Patient demonstrated GOOD insight into the subject matter, was respectful of peers, and was present throughout the entire session.  Therapeutic Modalities:   Cognitive Behavioral Therapy Solution-Focused Therapy  [name] Hinton,  [place] 11/15/2024  1:53 PM

## 2024-11-15 NOTE — Plan of Care (Signed)
   Problem: Health Behavior/Discharge Planning: Goal: Identification of resources available to assist in meeting health care needs will improve Outcome: Progressing Goal: Compliance with treatment plan for underlying cause of condition will improve Outcome: Progressing

## 2024-11-15 NOTE — Progress Notes (Signed)
 Patient off site to receive CT scan after falling and reporting hitting head on floor.

## 2024-11-15 NOTE — Group Note (Signed)
 Date:  11/15/2024 Time:  11:14 PM  Group Topic/Focus:  Wrap-Up Group:   The focus of this group is to help patients review their daily goal of treatment and discuss progress on daily workbooks.    Participation Level:  Active  Participation Quality:  Appropriate  Affect:  Appropriate  Cognitive:  Alert  Insight: Appropriate  Engagement in Group:  Engaged  Modes of Intervention:  Discussion  Additional Comments:    Tommas CHRISTELLA Bunker 11/15/2024, 11:14 PM

## 2024-11-15 NOTE — Group Note (Signed)
 Date:  11/15/2024 Time:  11:37 AM  Group Topic/Focus:  Goals Group:   The focus of this group is to help patients establish daily goals to achieve during treatment and discuss how the patient can incorporate goal setting into their daily lives to aide in recovery. Healthy Communication:   The focus of this group is to discuss communication, barriers to communication, as well as healthy ways to communicate with others.    Participation Level:  Active  Participation Quality:  Appropriate  Affect:  Appropriate  Cognitive:  Appropriate  Insight: Appropriate  Engagement in Group:  Engaged  Modes of Intervention:  Activity and Discussion  Additional Comments:    Sahas Sluka L Clemente Dewey 11/15/2024, 11:37 AM

## 2024-11-16 DIAGNOSIS — F319 Bipolar disorder, unspecified: Secondary | ICD-10-CM | POA: Diagnosis not present

## 2024-11-16 MED ORDER — GABAPENTIN 300 MG PO CAPS
300.0000 mg | ORAL_CAPSULE | Freq: Three times a day (TID) | ORAL | Status: DC
  Administered 2024-11-16 – 2024-11-28 (×36): 300 mg via ORAL
  Filled 2024-11-16 (×35): qty 1

## 2024-11-16 MED ORDER — CLONAZEPAM 0.25 MG PO TBDP
0.2500 mg | ORAL_TABLET | Freq: Two times a day (BID) | ORAL | Status: DC | PRN
  Administered 2024-11-17: 12:00:00 0.25 mg via ORAL
  Filled 2024-11-16: qty 1

## 2024-11-16 NOTE — Group Note (Signed)
 Date:  11/16/2024 Time:  3:49 PM  Group Topic/Focus:  Emotional Education:   The focus of this group is to discuss what feelings/emotions are, and how they are experienced.    Participation Level:  Active  Participation Quality:  Appropriate  Affect:  Appropriate  Cognitive:  Appropriate  Insight: Appropriate  Engagement in Group:  Engaged  Modes of Intervention:  Discussion   [name] 11/16/2024, 3:49 PM

## 2024-11-16 NOTE — BHH Counselor (Addendum)
 CSW touched base with [facility] on patient's behalf and spoke with admissions coordinator, Darin. Darin reported that there is a bef available at the [place], [place] location.   When CSW made patient aware of this, patient reported I can't be that far away from my mother. Patient's mother is in [place], [place].   Patient completed the phone screening just in case another bed became available in [place], [place].   Darin reported that patient reported that her identification and birth certificate are both at her mother's home and that she needs at least one of these to be able to join the program. Darin reported that if the mother is able to send either on the patient's behalf via mail than she could be considered for the program.   CSW touched base with patient's mother who reported that she does have the patient's birth certificate. Mother reported that she will work with the patient's younger sister to get the patient's birth certificate sent to the program if she is interested in going.   CSW touched base with Darin who needed to coordinate with his supervisor. Gustav reported that they are willing to offer the bed to a patient.   Patient declined offer.   CSW to continue to assess.   Jrue Jarriel, MSW, LCSWA 11/16/2024 4:13 PM

## 2024-11-16 NOTE — Progress Notes (Signed)
 Mobility Specialist Progress Note:    11/16/24 1357  Therapy Vitals  Patient Position (if appropriate) Orthostatic Vitals  Orthostatic Lying   BP- Lying 97/67  Pulse- Lying 88  Orthostatic Sitting  BP- Sitting 103/67  Pulse- Sitting 97  Orthostatic Standing at 0 minutes  BP- Standing at 0 minutes 101/68  Pulse- Standing at 0 minutes 101  Orthostatic Standing at 3 minutes  BP- Standing at 3 minutes 104/75  Pulse- Standing at 3 minutes 99  Mobility  Activity Stood at bedside  Level of Assistance Standby assist, set-up cues, supervision of patient - no hands on  Assistive Device None  Mobility visit 1 Mobility  Mobility Specialist Start Time (ACUTE ONLY) 1322  Mobility Specialist Stop Time (ACUTE ONLY) 1336  Mobility Specialist Time Calculation (min) (ACUTE ONLY) 14 min   Pt received in bed, pleasant and agreeable to orthostatic vital signs. Required SBA to stand at bedside with no AD. Tolerated well, pt denies dizziness throughout session. Returned supine, notified nursing. All needs met.  Sherrilee Ditty Mobility Specialist Please contact via Special Educational Needs Teacher or  Rehab office at [phone]

## 2024-11-16 NOTE — Progress Notes (Signed)
 [facility] MD Progress Note  11/16/2024 1:15 PM [name] Hinton  MRN:  [id number]  Patient is a 63 year old female with a history of Bipolar Disorder who presents voluntarily to [facility] for assessment. Patient presents via [facility] via [employer] with chief complaint of suicide attempt. [employer] was contacted by patient's mother after patient place a note on the bathroom door stating: Mom do not open this door!  Just call 911, and let them come in here.  You do not need to see me.  I am sorry!  This is what you need to tell the 911 operator: Tell them I told you not to come in.  I have slit my wrist and bled out.  I am dead (I hope!).    Patient endorses having suicidal thoughts for the past week and she confirms that today she locked herself in the bathroom with a knife and started to cut her wrist. Client also reported overdosing on 1400mg  of her heart medications a week ago.Patient is admitted to [facility] unit with Q15 min safety monitoring. Multidisciplinary team approach is offered. Medication management; group/milieu therapy is offered.  Subjective:  Chart reviewed, case discussed in multidisciplinary meeting, patient seen during rounds.   Today on interview patient is noted to be resting in bed.  She reports that she is feeling dizzy and woozy.  Provider discussed about reducing the dosages of some of the sedating hypnotics and medications that can cause low blood pressure.  She is on multiple medications for hypertension.  Patient denies SI/HI/plan and denies hallucinations.  She reports that she is ready to [place] [facility] [place] facilities to get into one of the facilities.  She remains frustrated and anxious about being homeless.  Will reduce Klonopin  dosage to 0.25 mg to help with reducing the drop in blood pressure.  Patient has no family support at this time and remains chronic moderate risk for self-harm if she is discharged to the streets with no resources. Past Psychiatric History:  see h&P Family History:  Family History  Problem Relation Age of Onset   Breast cancer Neg Hx    Social History:  Social History   Substance and Sexual Activity  Alcohol  Use No     Social History   Substance and Sexual Activity  Drug Use No    Social History   Socioeconomic History   Marital status: Divorced    Spouse name: Not on file   Number of children: Not on file   Years of education: Not on file   Highest education level: Not on file  Occupational History   Not on file  Tobacco Use   Smoking status: Some Days    Types: Cigarettes   Smokeless tobacco: Never   Tobacco comments:    03/07/2024 patient smokes about 4-5 cigarettes some days    02/09/2023 A pack last patient about a week  Substance and Sexual Activity   Alcohol  use: No   Drug use: No   Sexual activity: Yes    Birth control/protection: Surgical  Other Topics Concern   Not on file  Social History Narrative   Not on file   Social Drivers of Health   Tobacco Use: High Risk (11/08/2024)   Patient History    Smoking Tobacco Use: Some Days    Smokeless Tobacco Use: Never    Passive Exposure: Not on file  Financial Resource Strain: Not on File (09/25/2022)   Received from [employer]  Financial Resource Strain: 0  Food Insecurity: No Food Insecurity (11/08/2024)   Epic    Worried About Programme Researcher, Broadcasting/film/video in the Last Year: Never true    Ran Out of Food in the Last Year: Never true  Transportation Needs: No Transportation Needs (11/08/2024)   Epic    Lack of Transportation (Medical): No    Lack of Transportation (Non-Medical): No  Physical Activity: Not on File (03/20/2022)   Received from [facility]   Physical Activity    Physical Activity: 0  Stress: Not on File (03/20/2022)   Received from [facility]   Stress    Stress: 0  Social Connections: Not on File (08/10/2023)   Received from [facility]   Social Connections    Connectedness: 0  Depression (PHQ2-9): Not on file  Alcohol   Screen: Low Risk (11/07/2024)   Alcohol  Screen    Last Alcohol  Screening Score (AUDIT): 1  Housing: Low Risk (11/08/2024)   Epic    Unable to Pay for Housing in the Last Year: No    Number of Times Moved in the Last Year: 0    Homeless in the Last Year: No  Utilities: Not At Risk (11/08/2024)   Epic    Threatened with loss of utilities: No  Health Literacy: Not on file   Past Medical History:  Past Medical History:  Diagnosis Date   Anxiety    Bipolar affective (HCC)    Chronic headaches    Depression    Hypercholesteremia    Hypertension    Restless leg syndrome     Past Surgical History:  Procedure Laterality Date   ABDOMINAL HYSTERECTOMY     OOPHORECTOMY     TONSILLECTOMY      Current Medications: Current Facility-Administered Medications  Medication Dose Route Frequency Provider Last Rate Last Admin   acetaminophen  (TYLENOL ) tablet 650 mg  650 mg Oral Q6H PRN McLauchlin, Angela, NP   650 mg at 11/16/24 1039   alum & mag hydroxide-simeth (MAALOX/MYLANTA) 200-200-20 MG/5ML suspension 30 mL  30 mL Oral Q4H PRN McLauchlin, Angela, NP   30 mL at 11/13/24 1457   artificial tears ophthalmic solution 1 drop  1 drop Both Eyes TID PRN Janitza Revuelta, MD       aspirin  EC tablet 81 mg  81 mg Oral QHS Nirav Sweda, MD   81 mg at 11/15/24 2149   clonazePAM  (KLONOPIN ) disintegrating tablet 0.25 mg  0.25 mg Oral BID PRN Haylie Mccutcheon, MD       haloperidol  (HALDOL ) tablet 5 mg  5 mg Oral TID PRN McLauchlin, Angela, NP       And   diphenhydrAMINE  (BENADRYL ) capsule 50 mg  50 mg Oral TID PRN McLauchlin, Jon, NP       haloperidol  lactate (HALDOL ) injection 5 mg  5 mg Intramuscular TID PRN McLauchlin, Angela, NP       And   diphenhydrAMINE  (BENADRYL ) injection 50 mg  50 mg Intramuscular TID PRN McLauchlin, Angela, NP       And   LORazepam  (ATIVAN ) injection 2 mg  2 mg Intramuscular TID PRN McLauchlin, Angela, NP       haloperidol  lactate (HALDOL ) injection 10 mg  10 mg  Intramuscular TID PRN McLauchlin, Angela, NP       And   diphenhydrAMINE  (BENADRYL ) injection 50 mg  50 mg Intramuscular TID PRN McLauchlin, Angela, NP       And   LORazepam  (ATIVAN ) injection 2 mg  2 mg Intramuscular TID PRN McLauchlin, Jon,  NP       gabapentin  (NEURONTIN ) capsule 300 mg  300 mg Oral TID Krayton Wortley, MD       spironolactone  (ALDACTONE ) tablet 25 mg  25 mg Oral Daily Niels Kayla FALCON, RPH   25 mg at 11/16/24 0945   And   hydrochlorothiazide  (HYDRODIURIL ) tablet 25 mg  25 mg Oral Daily Niels Kayla FALCON, RPH   25 mg at 11/16/24 0945   hydrOXYzine  (ATARAX ) tablet 25 mg  25 mg Oral TID PRN McLauchlin, Angela, NP   25 mg at 11/15/24 2246   irbesartan  (AVAPRO ) tablet 37.5 mg  37.5 mg Oral Daily Benay Pomeroy, MD   37.5 mg at 11/16/24 0945   magnesium  hydroxide (MILK OF MAGNESIA) suspension 30 mL  30 mL Oral Daily PRN McLauchlin, Angela, NP       metoprolol  tartrate (LOPRESSOR ) tablet 25 mg  25 mg Oral BID PRN Cherly Erno, MD   25 mg at 11/12/24 1949   multivitamin with minerals tablet 1 tablet  1 tablet Oral q morning Chanee Henrickson, MD   1 tablet at 11/16/24 0945   naproxen  (NAPROSYN ) tablet 250 mg  250 mg Oral BID WC Jacqueline Spofford, MD   250 mg at 11/16/24 0805   nicotine  (NICODERM CQ  - dosed in mg/24 hours) patch 14 mg  14 mg Transdermal Daily Saffron Busey, MD   14 mg at 11/16/24 0944   pantoprazole  (PROTONIX ) EC tablet 40 mg  40 mg Oral Daily PRN Josep Luviano, MD       rOPINIRole  (REQUIP ) tablet 1 mg  1 mg Oral QHS Lio Wehrly, MD   1 mg at 11/15/24 2149   simvastatin  (ZOCOR ) tablet 20 mg  20 mg Oral QHS Floyde Dingley, MD   20 mg at 11/15/24 2149   traZODone  (DESYREL ) tablet 50 mg  50 mg Oral QHS PRN McLauchlin, Angela, NP   50 mg at 11/15/24 2149   venlafaxine  XR (EFFEXOR -XR) 24 hr capsule 150 mg  150 mg Oral Q breakfast Yashica Sterbenz, MD   150 mg at 11/16/24 9194    Lab Results:  No results found for this or any previous visit (from the past  48 hours).   Blood Alcohol  level:  Lab Results  Component Value Date   [facility] <15 11/07/2024    Metabolic Disorder Labs: No results found for: HGBA1C, MPG Lab Results  Component Value Date   PROLACTIN 8.7 11/07/2024   Lab Results  Component Value Date   CHOL 174 03/07/2024   TRIG 62 03/07/2024   HDL 62 03/07/2024   CHOLHDL 2.8 03/07/2024   VLDL 28 11/18/2007   LDLCALC 100 (H) 03/07/2024   LDLCALC (H) 11/18/2007    183        Total Cholesterol/HDL:CHD Risk Coronary Heart Disease Risk Table                     Men   Women  1/2 Average Risk   3.4   3.3    Physical Findings: AIMS:  , ,  ,  ,    CIWA:    COWS:      Psychiatric Specialty Exam:  Presentation  General Appearance:  Appropriate for Environment; Casual  Eye Contact: Minimal  Speech: Normal Rate  Speech Volume: Decreased    Mood and Affect  Mood: Anxious; Depressed  Affect: Depressed; Flat   Thought Process  Thought Processes: Coherent  Orientation:Full (Time, Place and Person)  Thought Content:Logical  Hallucinations: Denies  Ideas of  Reference:None  Suicidal Thoughts: Denies active SI  Homicidal Thoughts: Denies   Sensorium  Memory: Immediate Fair; Remote Fair  Judgment: Impaired  Insight: Shallow   Executive Functions  Concentration: Fair  Attention Span: Fair  Recall: [employer] of Knowledge: Fair  Language: Fair   Psychomotor Activity  Psychomotor Activity: No data recorded  Musculoskeletal: Strength & Muscle Tone: within normal limits Gait & Station: normal Assets  Assets: Manufacturing Systems Engineer; Desire for Improvement    Physical Exam: Physical Exam Vitals and nursing note reviewed.    ROS Blood pressure 106/68, pulse 99, temperature (!) 97.4 F (36.3 C), resp. rate 16, height 5' 2 (1.575 m), weight 51.3 kg, SpO2 99%. Body mass index is 20.67 kg/m.  Diagnosis: Active Problems:   Bipolar I disorder, most recent episode  depressed [facility])   Clinical Decision Making: Patient is currently admitted after a suicide attempt by cutting herself in the context of worsening depression and grieving after the loss of her father and boyfriend.  She will be monitored closely on the inpatient unit  Treatment Plan Summary:  Safety and Monitoring:             -- Voluntary admission to inpatient psychiatric unit for safety, stabilization and treatment             -- Daily contact with patient to assess and evaluate symptoms and progress in treatment             -- Patient's case to be discussed in multi-disciplinary team meeting             -- Observation Level: q15 minute checks             -- Vital signs:  q12 hours             -- Precautions: suicide, elopement, and assault   2. Psychiatric Diagnoses and Treatment:                Klonopin  0. 2 5 mg nightly and 0. 2 5 mg as needed daily  Effexor  XR increased to 150 mg daily Reduced her gabapentin  to 300 mg 3 times daily -- The risks/benefits/side-effects/alternatives to this medication were discussed in detail with the patient and time was given for questions. The patient consents to medication trial.                -- Metabolic profile and EKG monitoring obtained while on an atypical antipsychotic (BMI: Lipid Panel: HbgA1c: QTc:)              -- Encouraged patient to participate in unit milieu and in scheduled group therapies                            3. Medical Issues Being Addressed:  Dizziness-PT consult is done for further guidance 4. Discharge Planning:   -- Social work and case management to assist with discharge planning and identification of hospital follow-up needs prior to discharge  -- Estimated LOS: 3-4 days  Allyn Foil, MD 11/16/2024, 1:15 PM

## 2024-11-16 NOTE — BHH Counselor (Signed)
 CSW touched base with [facility] in [place] with listed vacancies and was able to reach [employer], [place] [employer] and spoke with art therapist, Hospital Doctor T at [phone]. Amber reported that they did have a room available. Amber set up an interview with the patient for 6:00 PM this evening 11/16/24.   Patient has been given information to conduct phone screening.   CSW to continue to assess.   Cierrah Dace, MSW, LCSWA 11/16/2024 4:18 PM

## 2024-11-16 NOTE — Progress Notes (Signed)
 SI/HI/AVH: denies all  Behavior/Mood:  cooperative/anxious and pleasant  Interaction/Group attendance: assertive/3 of 3 groups    Medication/PRNs: compliant/ tylenol  x2   Pain: 3/10 right neck pain  Other: pt had an unwitnessed fall 12.16

## 2024-11-16 NOTE — Group Note (Signed)
 Date:  11/16/2024 Time:  8:54 PM  Group Topic/Focus:  Wrap-Up Group:   The focus of this group is to help patients review their daily goal of treatment and discuss progress on daily workbooks.    Participation Level:  Active  Participation Quality:  Appropriate  Affect:  Appropriate  Cognitive:  Alert  Insight: Appropriate  Engagement in Group:  Engaged  Modes of Intervention:  Discussion  Additional Comments:    Sherrilyn JAYSON Redman 11/16/2024, 8:54 PM

## 2024-11-16 NOTE — Group Note (Signed)
 Date:  11/16/2024 Time:  10:53 AM  Group Topic/Focus:  Healthy Communication:   The focus of this group is to discuss communication, barriers to communication, as well as healthy ways to communicate with others.    Participation Level:  Active  Participation Quality:  Appropriate  Affect:  Appropriate  Cognitive:  Appropriate  Insight: Appropriate  Engagement in Group:  Engaged  Modes of Intervention:  Activity  Additional Comments:  N/A  Harlene LITTIE Gavel 11/16/2024, 10:53 AM

## 2024-11-16 NOTE — Plan of Care (Signed)
   Problem: Self-Concept: Goal: Will verbalize positive feelings about self Outcome: Progressing Goal: Level of anxiety will decrease Outcome: Progressing

## 2024-11-16 NOTE — Progress Notes (Signed)
°   11/15/24 2100  Psych Admission Type (Psych Patients Only)  Admission Status Voluntary  Psychosocial Assessment  Patient Complaints Anxiety;Agitation  Eye Contact Fair  Facial Expression Animated  Affect Anxious  Speech Logical/coherent  Interaction Assertive  Motor Activity Unsteady  Appearance/Hygiene Unremarkable  Behavior Characteristics Anxious;Agitated  Mood Anxious  Thought Process  Coherency WDL  Content WDL  Delusions None reported or observed  Perception WDL  Hallucination None reported or observed  Judgment Impaired  Confusion None  Danger to Self  Current suicidal ideation? Denies

## 2024-11-16 NOTE — Plan of Care (Signed)
   Problem: Education: Goal: Ability to state activities that reduce stress will improve Outcome: Progressing   Problem: Coping: Goal: Ability to identify and develop effective coping behavior will improve Outcome: Progressing   Problem: Self-Concept: Goal: Ability to identify factors that promote anxiety will improve Outcome: Progressing Goal: Level of anxiety will decrease Outcome: Progressing

## 2024-11-17 DIAGNOSIS — F319 Bipolar disorder, unspecified: Secondary | ICD-10-CM | POA: Diagnosis not present

## 2024-11-17 MED ORDER — MELATONIN 5 MG PO TABS
10.0000 mg | ORAL_TABLET | Freq: Once | ORAL | Status: AC
  Administered 2024-11-18: 10 mg via ORAL
  Filled 2024-11-17: qty 2

## 2024-11-17 NOTE — BHH Group Notes (Signed)
 Spirituality Group   Group Goal: Support / Education around grief and loss   Group Description: Following introductions and group rules, group members engaged in facilitated group dialog and support around topic of loss, with particular support around experiences of loss in their lives. Group members identified types of loss (relationships / self / things) as well as patterns, circumstances, and changes that precipitate loss. Reflection invited on thoughts / feelings around loss, normalized grief responses, and recognized variety in grief experience. Group noted Worden's four tasks of grief in discussion. Group drew on Adlerian / Rogerian, narrative, MI, with [place] group therapy as a primary framework.   Observations: Actively participated in group discussion; used process effectively for expressing grief around recent loss of parent; participated in mutual empathy among peers.  Bee Hammerschmidt L. Delores HERO.Div

## 2024-11-17 NOTE — Progress Notes (Signed)
 [facility] MD Progress Note  11/17/2024 12:34 PM [name] Hinton  MRN:  [id number]  Patient is a 63 year old female with a history of Bipolar Disorder who presents voluntarily to [facility] for assessment. Patient presents via [facility] via [employer] with chief complaint of suicide attempt. [employer] was contacted by patient's mother after patient place a note on the bathroom door stating: Mom do not open this door!  Just call 911, and let them come in here.  You do not need to see me.  I am sorry!  This is what you need to tell the 911 operator: Tell them I told you not to come in.  I have slit my wrist and bled out.  I am dead (I hope!).    Patient endorses having suicidal thoughts for the past week and she confirms that today she locked herself in the bathroom with a knife and started to cut her wrist. Client also reported overdosing on 1400mg  of her heart medications a week ago.Patient is admitted to [facility] unit with Q15 min safety monitoring. Multidisciplinary team approach is offered. Medication management; group/milieu therapy is offered.  Subjective:  Chart reviewed, case discussed in multidisciplinary meeting, patient seen during rounds.   Patient is noted to be resting in her bed.  She reports that she has been calling 2 different places for transition of care including [facility] [place], [facility].  She reports having another interview at 6 PM that day.  She denies SI/HI/plan and denies hallucinations.  She remains anxious about being homeless and her next apps.  She is reporting fair appetite and sleep.  She is noted to be visible on the unit, participating in groups. Past Psychiatric History: see h&P Family History:  Family History  Problem Relation Age of Onset   Breast cancer Neg Hx    Social History:  Social History   Substance and Sexual Activity  Alcohol  Use No     Social History   Substance and Sexual Activity  Drug Use No    Social History   Socioeconomic  History   Marital status: Divorced    Spouse name: Not on file   Number of children: Not on file   Years of education: Not on file   Highest education level: Not on file  Occupational History   Not on file  Tobacco Use   Smoking status: Some Days    Types: Cigarettes   Smokeless tobacco: Never   Tobacco comments:    03/07/2024 patient smokes about 4-5 cigarettes some days    02/09/2023 A pack last patient about a week  Substance and Sexual Activity   Alcohol  use: No   Drug use: No   Sexual activity: Yes    Birth control/protection: Surgical  Other Topics Concern   Not on file  Social History Narrative   Not on file   Social Drivers of Health   Tobacco Use: High Risk (11/08/2024)   Patient History    Smoking Tobacco Use: Some Days    Smokeless Tobacco Use: Never    Passive Exposure: Not on file  Financial Resource Strain: Not on File (09/25/2022)   Received from [employer]    Financial Resource Strain: 0  Food Insecurity: No Food Insecurity (11/08/2024)   Epic    Worried About Programme Researcher, Broadcasting/film/video in the Last Year: Never true    Ran Out of Food in the Last Year: Never true  Transportation Needs: No  Transportation Needs (11/08/2024)   Epic    Lack of Transportation (Medical): No    Lack of Transportation (Non-Medical): No  Physical Activity: Not on File (03/20/2022)   Received from [facility]   Physical Activity    Physical Activity: 0  Stress: Not on File (03/20/2022)   Received from [facility]   Stress    Stress: 0  Social Connections: Not on File (08/10/2023)   Received from [facility]   Social Connections    Connectedness: 0  Depression (PHQ2-9): Not on file  Alcohol  Screen: Low Risk (11/07/2024)   Alcohol  Screen    Last Alcohol  Screening Score (AUDIT): 1  Housing: Low Risk (11/08/2024)   Epic    Unable to Pay for Housing in the Last Year: No    Number of Times Moved in the Last Year: 0    Homeless in the Last Year: No  Utilities: Not At Risk  (11/08/2024)   Epic    Threatened with loss of utilities: No  Health Literacy: Not on file   Past Medical History:  Past Medical History:  Diagnosis Date   Anxiety    Bipolar affective (HCC)    Chronic headaches    Depression    Hypercholesteremia    Hypertension    Restless leg syndrome     Past Surgical History:  Procedure Laterality Date   ABDOMINAL HYSTERECTOMY     OOPHORECTOMY     TONSILLECTOMY      Current Medications: Current Facility-Administered Medications  Medication Dose Route Frequency Provider Last Rate Last Admin   acetaminophen  (TYLENOL ) tablet 650 mg  650 mg Oral Q6H PRN McLauchlin, Angela, NP   650 mg at 11/17/24 0619   alum & mag hydroxide-simeth (MAALOX/MYLANTA) 200-200-20 MG/5ML suspension 30 mL  30 mL Oral Q4H PRN McLauchlin, Jon, NP   30 mL at 11/13/24 1457   artificial tears ophthalmic solution 1 drop  1 drop Both Eyes TID PRN Marshay Slates, MD       aspirin  EC tablet 81 mg  81 mg Oral QHS Sylvania Moss, MD   81 mg at 11/16/24 2119   clonazePAM  (KLONOPIN ) disintegrating tablet 0.25 mg  0.25 mg Oral BID PRN Khayri Kargbo, MD   0.25 mg at 11/17/24 1158   haloperidol  (HALDOL ) tablet 5 mg  5 mg Oral TID PRN McLauchlin, Angela, NP       And   diphenhydrAMINE  (BENADRYL ) capsule 50 mg  50 mg Oral TID PRN McLauchlin, Angela, NP       haloperidol  lactate (HALDOL ) injection 5 mg  5 mg Intramuscular TID PRN McLauchlin, Jon, NP       And   diphenhydrAMINE  (BENADRYL ) injection 50 mg  50 mg Intramuscular TID PRN McLauchlin, Jon, NP       And   LORazepam  (ATIVAN ) injection 2 mg  2 mg Intramuscular TID PRN McLauchlin, Jon, NP       haloperidol  lactate (HALDOL ) injection 10 mg  10 mg Intramuscular TID PRN McLauchlin, Angela, NP       And   diphenhydrAMINE  (BENADRYL ) injection 50 mg  50 mg Intramuscular TID PRN McLauchlin, Angela, NP       And   LORazepam  (ATIVAN ) injection 2 mg  2 mg Intramuscular TID PRN McLauchlin, Angela, NP       gabapentin   (NEURONTIN ) capsule 300 mg  300 mg Oral TID Ernesto Zukowski, MD   300 mg at 11/17/24 1016   spironolactone  (ALDACTONE ) tablet 25 mg  25 mg Oral Daily Niels Kayla FALCON, [facility]  25 mg at 11/17/24 1016   And   hydrochlorothiazide  (HYDRODIURIL ) tablet 25 mg  25 mg Oral Daily Greenwood, Howard F, RPH   25 mg at 11/17/24 1016   hydrOXYzine  (ATARAX ) tablet 25 mg  25 mg Oral TID PRN McLauchlin, Angela, NP   25 mg at 11/16/24 2118   irbesartan  (AVAPRO ) tablet 37.5 mg  37.5 mg Oral Daily Suhaas Agena, MD   37.5 mg at 11/17/24 1016   magnesium  hydroxide (MILK OF MAGNESIA) suspension 30 mL  30 mL Oral Daily PRN McLauchlin, Jon, NP       metoprolol  tartrate (LOPRESSOR ) tablet 25 mg  25 mg Oral BID PRN Nike Southwell, MD   25 mg at 11/17/24 9380   multivitamin with minerals tablet 1 tablet  1 tablet Oral q morning Cypress Hinkson, MD   1 tablet at 11/17/24 1016   naproxen  (NAPROSYN ) tablet 250 mg  250 mg Oral BID WC Gracelyn Coventry, MD   250 mg at 11/17/24 0805   nicotine  (NICODERM CQ  - dosed in mg/24 hours) patch 14 mg  14 mg Transdermal Daily Yanixan Mellinger, MD   14 mg at 11/17/24 1017   pantoprazole  (PROTONIX ) EC tablet 40 mg  40 mg Oral Daily PRN Lorianna Spadaccini, MD       rOPINIRole  (REQUIP ) tablet 1 mg  1 mg Oral QHS Brendon Christoffel, MD   1 mg at 11/16/24 2119   simvastatin  (ZOCOR ) tablet 20 mg  20 mg Oral QHS Nadirah Socorro, MD   20 mg at 11/16/24 2119   traZODone  (DESYREL ) tablet 50 mg  50 mg Oral QHS PRN McLauchlin, Angela, NP   50 mg at 11/16/24 2118   venlafaxine  XR (EFFEXOR -XR) 24 hr capsule 150 mg  150 mg Oral Q breakfast Jadis Pitter, MD   150 mg at 11/17/24 9194    Lab Results:  No results found for this or any previous visit (from the past 48 hours).   Blood Alcohol  level:  Lab Results  Component Value Date   [facility] <15 11/07/2024    Metabolic Disorder Labs: No results found for: HGBA1C, MPG Lab Results  Component Value Date   PROLACTIN 8.7 11/07/2024   Lab Results   Component Value Date   CHOL 174 03/07/2024   TRIG 62 03/07/2024   HDL 62 03/07/2024   CHOLHDL 2.8 03/07/2024   VLDL 28 11/18/2007   LDLCALC 100 (H) 03/07/2024   LDLCALC (H) 11/18/2007    183        Total Cholesterol/HDL:CHD Risk Coronary Heart Disease Risk Table                     Men   Women  1/2 Average Risk   3.4   3.3    Physical Findings: AIMS:  , ,  ,  ,    CIWA:    COWS:      Psychiatric Specialty Exam:  Presentation  General Appearance:  Appropriate for Environment; Casual  Eye Contact: Minimal  Speech: Normal Rate  Speech Volume: Decreased    Mood and Affect  Mood: Anxious; Depressed  Affect: Depressed; Flat   Thought Process  Thought Processes: Coherent  Orientation:Full (Time, Place and Person)  Thought Content:Logical  Hallucinations: Denies  Ideas of Reference:None  Suicidal Thoughts: Denies active SI  Homicidal Thoughts: Denies   Sensorium  Memory: Immediate Fair; Remote Fair  Judgment: Impaired  Insight: Shallow   Executive Functions  Concentration: Fair  Attention Span: Fair  Recall: [employer]  of Knowledge: Fair  Language: Fair   Psychomotor Activity  Psychomotor Activity: No data recorded  Musculoskeletal: Strength & Muscle Tone: within normal limits Gait & Station: normal Assets  Assets: Manufacturing Systems Engineer; Desire for Improvement    Physical Exam: Physical Exam Vitals and nursing note reviewed.    ROS Blood pressure 104/73, pulse 72, temperature (!) 97 F (36.1 C), resp. rate 18, height 5' 2 (1.575 m), weight 51.3 kg, SpO2 99%. Body mass index is 20.67 kg/m.  Diagnosis: Active Problems:   Bipolar I disorder, most recent episode depressed [facility])   Clinical Decision Making: Patient is currently admitted after a suicide attempt by cutting herself in the context of worsening depression and grieving after the loss of her father and boyfriend.  She will be monitored closely on the  inpatient unit  Treatment Plan Summary:  Safety and Monitoring:             -- Voluntary admission to inpatient psychiatric unit for safety, stabilization and treatment             -- Daily contact with patient to assess and evaluate symptoms and progress in treatment             -- Patient's case to be discussed in multi-disciplinary team meeting             -- Observation Level: q15 minute checks             -- Vital signs:  q12 hours             -- Precautions: suicide, elopement, and assault   2. Psychiatric Diagnoses and Treatment:                Klonopin  0. 2 5 mg nightly and 0. 2 5 mg as needed daily  Effexor  XR increased to 150 mg daily Reduced her gabapentin  to 300 mg 3 times daily -- The risks/benefits/side-effects/alternatives to this medication were discussed in detail with the patient and time was given for questions. The patient consents to medication trial.                -- Metabolic profile and EKG monitoring obtained while on an atypical antipsychotic (BMI: Lipid Panel: HbgA1c: QTc:)              -- Encouraged patient to participate in unit milieu and in scheduled group therapies                            3. Medical Issues Being Addressed:  Dizziness-PT consult is done for further guidance 4. Discharge Planning:   -- Social work and case management to assist with discharge planning and identification of hospital follow-up needs prior to discharge  -- Estimated LOS: 3-4 days  Natasa Stigall, MD 11/17/2024, 12:34 PM

## 2024-11-17 NOTE — Progress Notes (Signed)
°   11/16/24 2339  Psych Admission Type (Psych Patients Only)  Admission Status Voluntary  Psychosocial Assessment  Patient Complaints Anxiety;Hyperactivity  Eye Contact Fair  Facial Expression Animated;Anxious  Affect Appropriate to circumstance  Speech Logical/coherent  Interaction Assertive;Intrusive  Motor Activity Unsteady  Appearance/Hygiene Unremarkable  Behavior Characteristics Cooperative  Mood Anxious;Pleasant  Thought Process  Coherency WDL  Content WDL  Delusions None reported or observed  Perception WDL  Hallucination None reported or observed  Judgment WDL  Confusion None  Danger to Self  Current suicidal ideation? Denies  Danger to Others  Danger to Others None reported or observed    Estimated Sleeping Duration (Last 24 Hours): 6.00-7.50 hours

## 2024-11-17 NOTE — Group Note (Signed)
 Recreation Therapy Group Note   Group Topic:Emotion Expression  Group Date: 11/17/2024 Start Time: 1400 End Time: 1435 Facilitators: Celestia Jeoffrey BRAVO, LRT, CTRS Location: Dayroom  Group Description: Expressive Higher Education Careers Adviser. Patients received a blank postcard template. LRT encouraged pt to create a postcard to themselves in their younger days with the knowledge and wisdom they have today. Pts are encouraged to share something positive or words of encouragement on their post cards using colored pencils and markers. Once finished, patients and LRT had a discussion on why they chose the words they did and what it means to them.  Goal Area(s) Addressed: Patient will increase communication skills.  Patient will reminisce a fond memory in their life.   Patient will practice healthy decision making. Patient will express their emotions in a positive way.    Affect/Mood: Appropriate   Participation Level: Active and Engaged   Participation Quality: Independent   Behavior: Calm and Cooperative   Speech/Thought Process: Coherent   Insight: Good   Judgement: Good   Modes of Intervention: Art and Music   Patient Response to Interventions:  Attentive, Engaged, Interested , and Receptive   Education Outcome:  Acknowledges education   Clinical Observations/Individualized Feedback: Shiesha was active in their participation of session activities and group discussion. Pt identified Be strong and be yourself. Be kind and respect others. Be positive! As advice she would give.    Plan: Continue to engage patient in RT group sessions 2-3x/week.   Jeoffrey BRAVO Celestia, LRT, CTRS 11/17/2024 5:05 PM

## 2024-11-17 NOTE — Evaluation (Signed)
 Physical Therapy Evaluation Patient Details Name: [name] Hinton MRN: [id number] DOB: 21-Aug-1961 Today's Date: 11/17/2024  History of Present Illness  63 year old female with a history of Bipolar Disorder who presents voluntarily to [facility] for assessment after suicide attempt.  Clinical Impression  Pt was able to move with good confidence and safety.  She had community appropriate speed and had no LOBs with multiple bouts of prolonged ambulation, she did have 2 small episodes of stagger stepping when first standing up that she self arrested and denied any lightheadedness, dizziness, or other symptoms.  She scored 52/56 on the Berg Balance Scale and overall showed good confidence with all but the highest level tasks.  Pt overall reports feeling close to her baseline, orthostatic testing yesterday showed no significant effect with positional changes, pt was negative with DiX-Hallpike BPPV testing and endorses feeling basically at her baseline and confident with mobility, gait, etc.  She did have a fall from squatting position the other day (maybe felt like I would black-out) and has some mild residual R neck musculature tightness - educated on appropriate stretches.  Pt presents as  safe to mobilize on the unit w/o AD and does not require (or desire) further PT follow up at this time.        If plan is discharge home, recommend the following:     Can travel by private vehicle        Equipment Recommendations None recommended by PT  Recommendations for Other Services       Functional Status Assessment Patient has not had a recent decline in their functional status     Precautions / Restrictions Precautions Precautions: Fall Restrictions Weight Bearing Restrictions Per Provider Order: No      Mobility  Bed Mobility Overal bed mobility: Independent                  Transfers Overall transfer level: Independent                 General  transfer comment: multiple sit to stand efforts w/o AD and no assist, she did have 2 occasions of small stagger stepping on intially standing up and trying to quickly turn but she self arrested w/o issue    Ambulation/Gait Ambulation/Gait assistance: Independent Gait Distance (Feet): 400 Feet Assistive device: None         General Gait Details: Pt easily and confidently ambulated in the hallways w/o AD, community appropriate speed and no LOBs, good confidence  Stairs            Wheelchair Mobility     Tilt Bed    Modified Rankin (Stroke Patients Only)       Balance Overall balance assessment: Modified Independent                               Standardized Balance Assessment Standardized Balance Assessment : Berg Balance Test Berg Balance Test Sit to Stand: Able to stand without using hands and stabilize independently Standing Unsupported: Able to stand safely 2 minutes Sitting with Back Unsupported but Feet Supported on Floor or Stool: Able to sit safely and securely 2 minutes Stand to Sit: Sits safely with minimal use of hands Transfers: Able to transfer safely, minor use of hands Standing Unsupported with Eyes Closed: Able to stand 10 seconds safely Standing Ubsupported with Feet Together: Able to place feet together independently and stand 1 minute safely  From Standing, Reach Forward with Outstretched Arm: Can reach forward >12 cm safely (5) From Standing Position, Pick up Object from Floor: Able to pick up shoe safely and easily From Standing Position, Turn to Look Behind Over each Shoulder: Looks behind from both sides and weight shifts well Turn 360 Degrees: Able to turn 360 degrees safely in 4 seconds or less Standing Unsupported, Alternately Place Feet on Step/Stool: Able to stand independently and safely and complete 8 steps in 20 seconds Standing Unsupported, One Foot in Front: Able to plae foot ahead of the other independently and hold 30  seconds Standing on One Leg: Able to lift leg independently and hold equal to or more than 3 seconds Total Score: 52         Pertinent Vitals/Pain Pain Assessment Pain Assessment: Faces Faces Pain Scale: Hurts little more Pain Location: R neck/trapezius    Home Living Family/patient expects to be discharged to:: Unsure                   Additional Comments: pt currently homeless, has stayed with her mother    Prior Function Prior Level of Function : Independent/Modified Independent                     Extremity/Trunk Assessment   Upper Extremity Assessment Upper Extremity Assessment: Overall WFL for tasks assessed    Lower Extremity Assessment Lower Extremity Assessment: Overall WFL for tasks assessed       Communication   Communication Communication: No apparent difficulties    Cognition Arousal: Alert Behavior During Therapy: WFL for tasks assessed/performed   PT - Cognitive impairments: No apparent impairments                         Following commands: Intact       Cueing Cueing Techniques: Verbal cues     General Comments General comments (skin integrity, edema, etc.): Pt did not report symptoms congruent with BPPV, but performed Dix-Hallpike as a rule out - negative b/l .    Exercises Other Exercises Other Exercises: educated on strategies and frequency to safely progress neck musculature stretches (mild whiplash-like symptoms after fall from squat position yesterday)   Assessment/Plan    PT Assessment Patient does not need any further PT services  PT Problem List         PT Treatment Interventions      PT Goals (Current goals can be found in the Care Plan section)  Acute Rehab PT Goals Patient Stated Goal: get neck pain down PT Goal Formulation: All assessment and education complete, DC therapy    Frequency       Co-evaluation               AM-PAC PT 6 Clicks Mobility  Outcome Measure Help needed  turning from your back to your side while in a flat bed without using bedrails?: None Help needed moving from lying on your back to sitting on the side of a flat bed without using bedrails?: None Help needed moving to and from a bed to a chair (including a wheelchair)?: None Help needed standing up from a chair using your arms (e.g., wheelchair or bedside chair)?: None Help needed to walk in hospital room?: None Help needed climbing 3-5 steps with a railing? : None 6 Click Score: 24    End of Session   Activity Tolerance: Patient tolerated treatment well Patient left: in chair (common room) Nurse  Communication: Mobility status PT Visit Diagnosis: Unsteadiness on feet (R26.81);History of falling (Z91.81);Pain Pain - Right/Left: Right Pain - part of body:  (cervicalgia)    Time: 9099-9071 PT Time Calculation (min) (ACUTE ONLY): 28 min   Charges:   PT Evaluation $PT Eval Low Complexity: 1 Low   PT General Charges $$ ACUTE PT VISIT: 1 Visit         Carmin JONELLE Deed, DPT 11/17/2024, 11:12 AM

## 2024-11-17 NOTE — Plan of Care (Signed)
  Problem: Education: Goal: Ability to state activities that reduce stress will improve Outcome: Progressing   Problem: Activity: Goal: Interest or engagement in activities will improve Outcome: Progressing

## 2024-11-17 NOTE — Plan of Care (Signed)
  Problem: Coping: Goal: Ability to identify and develop effective coping behavior will improve Outcome: Progressing   Problem: Self-Concept: Goal: Ability to identify factors that promote anxiety will improve Outcome: Progressing   

## 2024-11-17 NOTE — Progress Notes (Signed)
 SI/HI/AVH: denies all  Behavior/Mood: cooperative/pleasant but anxious    Interaction/Group attendance: assertive/ 2 of 3 groups   Medication/PRNs: compliant/ Tylenol  x 1 and klonopin  x1   Pain: 3/10 neck pain  Other: pt is currently interviewing with [facility]

## 2024-11-17 NOTE — Group Note (Signed)
 [facility] LCSW Group Therapy Note   Group Date: 11/17/2024 Start Time: 1300 End Time: 1400   Type of Therapy/Topic:  Group Therapy:  Balance in Life  Participation Level:  Active   Description of Group:    This group will address the concept of balance and how it feels and looks when one is unbalanced. Patients will be encouraged to process areas in their lives that are out of balance, and identify reasons for remaining unbalanced. Facilitators will guide patients utilizing problem- solving interventions to address and correct the stressor making their life unbalanced. Understanding and applying boundaries will be explored and addressed for obtaining  and maintaining a balanced life. Patients will be encouraged to explore ways to assertively make their unbalanced needs known to significant others in their lives, using other group members and facilitator for support and feedback.  Therapeutic Goals: Patient will identify two or more emotions or situations they have that consume much of in their lives. Patient will identify signs/triggers that life has become out of balance:  Patient will identify two ways to set boundaries in order to achieve balance in their lives:  Patient will demonstrate ability to communicate their needs through discussion and/or role plays  Summary of Patient Progress: The group participated in life review, with the facilitator guiding discussion through questions designed to encourage reflection on experiences that foster resilience. Both patients and facilitators engaged in an activity in which prompts were read aloud, and participants were invited to reflect on and share meaningful moments from specific periods in history and their own lives. The patient was willing to share, remained respectful of others, and contributed positively to the overall group dynamic.    Therapeutic Modalities:   Cognitive Behavioral Therapy Solution-Focused Therapy Assertiveness  Training   Alveta CHRISTELLA Kerns, LCSW

## 2024-11-17 NOTE — Group Note (Signed)
 Date:  11/17/2024 Time:  4:07 PM  Group Topic/Focus:  Overcoming Stress:   The focus of this group is to define stress and help patients assess their triggers.    Participation Level:  Did Not Attend  [name] Hinton 11/17/2024, 4:07 PM

## 2024-11-17 NOTE — BHH Counselor (Signed)
 CSW followed up with the [facility] in [place], [place] regarding the patients interview from the previous evening. Amber, Art Therapist, reported that the patient must complete a second interview scheduled for 11/17/24 at 6:00 PM before a final decision can be made.   CSW requested an earlier interview time; however, this request was denied.   The patients mother contacted CSW regarding the patients birth certificate/identification, and the patients sister will bring the patients birth certificate and identification to the hospital to facilitate admission into the patients chosen program.  CSW to continue to assess.   Lugenia Assefa, MSW, LCSWA 11/17/2024 11:35 AM

## 2024-11-18 DIAGNOSIS — F319 Bipolar disorder, unspecified: Secondary | ICD-10-CM | POA: Diagnosis not present

## 2024-11-18 NOTE — Group Note (Signed)
 Date:  11/18/2024 Time:  3:34 PM  Group Topic/Focus:  Movement Therapy, Getting fit with Zalma Channing.    Participation Level:  Active  Participation Quality:  Appropriate  Affect:  Appropriate  Cognitive:  Appropriate  Insight: Appropriate  Engagement in Group:  Engaged  Modes of Intervention:  Activity  Additional Comments:  none  Norleen SHAUNNA Bias 11/18/2024, 3:34 PM

## 2024-11-18 NOTE — Progress Notes (Signed)
" °   11/18/24 0900  Psych Admission Type (Psych Patients Only)  Admission Status Voluntary  Psychosocial Assessment  Patient Complaints Anxiety  Eye Contact Fair  Facial Expression Animated  Affect Appropriate to circumstance  Speech Logical/coherent  Interaction Assertive  Motor Activity Unsteady  Appearance/Hygiene Unremarkable  Behavior Characteristics Cooperative  Mood Pleasant  Thought Process  Coherency WDL  Content WDL  Delusions None reported or observed  Perception WDL  Hallucination None reported or observed  Judgment WDL  Confusion None  Danger to Self  Current suicidal ideation? Denies  Danger to Others  Danger to Others None reported or observed    "

## 2024-11-18 NOTE — Group Note (Signed)
 Physical/Occupational Therapy Group Note  Group Topic: Pain Management and Coping   Group Date: 11/18/2024 Start Time: 1300 End Time: 1400 Facilitators: Clive Warren CROME, OT   Group Description: Group discussed impact of chronic/acute pain on safety and independence with functional tasks and impact on mental health.  Identified and discussed any previously learned or implemented strategies used.  Discussed and reviewed cognitive behavioral pain coping strategies to address/improve overall management of pain. Discussed relaxation, distraction techniques, cognitive restructuring, activity pacing/energy conservation, environment/home safety modifications, and role of sleep and sleep hygiene. Allowed time for questions and further discussion.  Therapeutic Goal(s):  Identify and discuss previously utilized pain coping strategies and implications of pain on function/well-being Identify and discuss implementing new cognitive behavioral pain coping strategies into daily routines Demonstrate understanding and performance of learned cognitive behavioral pain coping strategies  Individual Participation: Pt did not attend.  Participation Level: Did not attend   Participation Quality:   Behavior:   Speech/Thought Process:   Affect/Mood:   Insight:   Judgement:   Modes of Intervention:   Patient Response to Interventions:    Plan: Continue to engage patient in PT/OT groups 1 - 2x/week.  Twala Collings R., MPH, MS, OTR/L ascom [phone] 11/18/2024, 3:16 PM

## 2024-11-18 NOTE — Group Note (Signed)
 Recreation Therapy Group Note   Group Topic:Communication  Group Date: 11/18/2024 Start Time: 1405 End Time: 1440 Facilitators: Celestia Jeoffrey BRAVO, LRT, CTRS Location: Courtyard  Group Description: Music. Patients are encouraged to name their favorite song(s) for LRT to play song through speaker for group to hear, while in the courtyard getting fresh air and sunlight. Patients educated on the definition of leisure and the importance of having different leisure interests outside of the hospital. Group discussed how leisure activities can often be used as pharmacologist and that listening to music and being outside are examples.    Goal Area(s) Addressed:  Patient will identify a current leisure interest.  Patient will practice making a positive decision. Patient will have the opportunity to try a new leisure activity.   Affect/Mood: Appropriate   Participation Level: Moderate    Clinical Observations/Individualized Feedback: Calin came late to group. Pt was present for half of group. Pt was appropriate while present.   Plan: Continue to engage patient in RT group sessions 2-3x/week.   Jeoffrey BRAVO Celestia, LRT, CTRS 11/18/2024 4:55 PM

## 2024-11-18 NOTE — Plan of Care (Signed)
   Problem: Education: Goal: Ability to state activities that reduce stress will improve Outcome: Progressing   Problem: Coping: Goal: Ability to identify and develop effective coping behavior will improve Outcome: Progressing   Problem: Self-Concept: Goal: Ability to identify factors that promote anxiety will improve Outcome: Progressing Goal: Level of anxiety will decrease Outcome: Progressing

## 2024-11-18 NOTE — Plan of Care (Signed)
 Patient alert and oriented x4. Denies SI, HI, AVH. Denies pain.  Scheduled medications administered per MAR. PRN meds administered for sleep, pt shared trazodone  did not help w/sleep the night before and rated sleep 2/10. On call provider notified for additional sleep aid. Support and encouragement provided.  Routine safety checks conducted every 15 minutes.  Patient informed to notify staff with problems or concerns. Patient verbally contracts for safety at this time. Patient interacts well with others on the unit.  Patient remains safe at this time.  Problem: Education: Goal: Ability to state activities that reduce stress will improve Outcome: Progressing   Problem: Education: Goal: Mental status will improve Outcome: Progressing   Problem: Education: Goal: Verbalization of understanding the information provided will improve Outcome: Progressing   Problem: Health Behavior/Discharge Planning: Goal: Identification of resources available to assist in meeting health care needs will improve Outcome: Progressing Goal: Compliance with treatment plan for underlying cause of condition will improve Outcome: Progressing   Problem: Safety: Goal: Periods of time without injury will increase Outcome: Progressing

## 2024-11-18 NOTE — Progress Notes (Signed)
 [facility] MD Progress Note  11/18/2024 12:27 PM [name] Hinton  MRN:  [id number]  Patient is a 63 year old female with a history of Bipolar Disorder who presents voluntarily to [facility] for assessment. Patient presents via [facility] via [employer] with chief complaint of suicide attempt. [employer] was contacted by patient's mother after patient place a note on the bathroom door stating: Mom do not open this door!  Just call 911, and let them come in here.  You do not need to see me.  I am sorry!  This is what you need to tell the 911 operator: Tell them I told you not to come in.  I have slit my wrist and bled out.  I am dead (I hope!).    Patient endorses having suicidal thoughts for the past week and she confirms that today she locked herself in the bathroom with a knife and started to cut her wrist. Client also reported overdosing on 1400mg  of her heart medications a week ago.Patient is admitted to [facility] unit with Q15 min safety monitoring. Multidisciplinary team approach is offered. Medication management; group/milieu therapy is offered.  Subjective:  Chart reviewed, case discussed in multidisciplinary meeting, patient seen during rounds.   Patient is noted to be busy on the unit making phone calls to multiple placements like sober living facilities.  She reports that so far nobody accepted her, especially after being declined by [facility] housing yesterday.  She reports that she is talking to her mom to request if she can come home until the month and and continue to look for places for herself.  Patient denies active SI/HI/plan and denies hallucinations.  She reports that she is ready for discharge after talking to her mom.  Treatment team discussed to reach out to patient's mom and confirm if patient is welcome back home.  Patient denies any side effects to medications.  She reports fair appetite and sleep  Past Psychiatric History: see h&P Family History:  Family History  Problem Relation Age  of Onset   Breast cancer Neg Hx    Social History:  Social History   Substance and Sexual Activity  Alcohol  Use No     Social History   Substance and Sexual Activity  Drug Use No    Social History   Socioeconomic History   Marital status: Divorced    Spouse name: Not on file   Number of children: Not on file   Years of education: Not on file   Highest education level: Not on file  Occupational History   Not on file  Tobacco Use   Smoking status: Some Days    Types: Cigarettes   Smokeless tobacco: Never   Tobacco comments:    03/07/2024 patient smokes about 4-5 cigarettes some days    02/09/2023 A pack last patient about a week  Substance and Sexual Activity   Alcohol  use: No   Drug use: No   Sexual activity: Yes    Birth control/protection: Surgical  Other Topics Concern   Not on file  Social History Narrative   Not on file   Social Drivers of Health   Tobacco Use: High Risk (11/08/2024)   Patient History    Smoking Tobacco Use: Some Days    Smokeless Tobacco Use: Never    Passive Exposure: Not on file  Financial Resource Strain: Not on File (09/25/2022)   Received from [employer]    Financial Resource Strain: 0  Food Insecurity: No Food Insecurity (11/08/2024)   Epic    Worried About Programme Researcher, Broadcasting/film/video in the Last Year: Never true    Ran Out of Food in the Last Year: Never true  Transportation Needs: No Transportation Needs (11/08/2024)   Epic    Lack of Transportation (Medical): No    Lack of Transportation (Non-Medical): No  Physical Activity: Not on File (03/20/2022)   Received from [facility]   Physical Activity    Physical Activity: 0  Stress: Not on File (03/20/2022)   Received [facility]   Stress    Stress: 0  Social Connections: Not on File (08/10/2023)   Received from [facility]   Social Connections    Connectedness: 0  Depression (PHQ2-9): Not on file  Alcohol  Screen: Low Risk (11/07/2024)   Alcohol  Screen    Last Alcohol   Screening Score (AUDIT): 1  Housing: Low Risk (11/08/2024)   Epic    Unable to Pay for Housing in the Last Year: No    Number of Times Moved in the Last Year: 0    Homeless in the Last Year: No  Utilities: Not At Risk (11/08/2024)   Epic    Threatened with loss of utilities: No  Health Literacy: Not on file   Past Medical History:  Past Medical History:  Diagnosis Date   Anxiety    Bipolar affective (HCC)    Chronic headaches    Depression    Hypercholesteremia    Hypertension    Restless leg syndrome     Past Surgical History:  Procedure Laterality Date   ABDOMINAL HYSTERECTOMY     OOPHORECTOMY     TONSILLECTOMY      Current Medications: Current Facility-Administered Medications  Medication Dose Route Frequency Provider Last Rate Last Admin   acetaminophen  (TYLENOL ) tablet 650 mg  650 mg Oral Q6H PRN McLauchlin, Angela, NP   650 mg at 11/17/24 1316   alum & mag hydroxide-simeth (MAALOX/MYLANTA) 200-200-20 MG/5ML suspension 30 mL  30 mL Oral Q4H PRN McLauchlin, Angela, NP   30 mL at 11/13/24 1457   artificial tears ophthalmic solution 1 drop  1 drop Both Eyes TID PRN Jawann Urbani, MD       aspirin  EC tablet 81 mg  81 mg Oral QHS Callaghan Laverdure, MD   81 mg at 11/17/24 2143   clonazePAM  (KLONOPIN ) disintegrating tablet 0.25 mg  0.25 mg Oral BID PRN Kenaz Olafson, MD   0.25 mg at 11/17/24 1158   haloperidol  (HALDOL ) tablet 5 mg  5 mg Oral TID PRN McLauchlin, Angela, NP       And   diphenhydrAMINE  (BENADRYL ) capsule 50 mg  50 mg Oral TID PRN McLauchlin, Angela, NP       haloperidol  lactate (HALDOL ) injection 5 mg  5 mg Intramuscular TID PRN McLauchlin, Jon, NP       And   diphenhydrAMINE  (BENADRYL ) injection 50 mg  50 mg Intramuscular TID PRN McLauchlin, Jon, NP       And   LORazepam  (ATIVAN ) injection 2 mg  2 mg Intramuscular TID PRN McLauchlin, Angela, NP       haloperidol  lactate (HALDOL ) injection 10 mg  10 mg Intramuscular TID PRN McLauchlin, Angela, NP        And   diphenhydrAMINE  (BENADRYL ) injection 50 mg  50 mg Intramuscular TID PRN McLauchlin, Angela, NP       And   LORazepam  (ATIVAN ) injection 2 mg  2 mg Intramuscular TID PRN McLauchlin, Angela, NP  gabapentin  (NEURONTIN ) capsule 300 mg  300 mg Oral TID Whitney Bingaman, MD   300 mg at 11/18/24 1008   spironolactone  (ALDACTONE ) tablet 25 mg  25 mg Oral Daily Niels Kayla FALCON, RPH   25 mg at 11/18/24 1008   And   hydrochlorothiazide  (HYDRODIURIL ) tablet 25 mg  25 mg Oral Daily Greenwood, Howard F, RPH   25 mg at 11/18/24 1007   hydrOXYzine  (ATARAX ) tablet 25 mg  25 mg Oral TID PRN McLauchlin, Angela, NP   25 mg at 11/18/24 1013   irbesartan  (AVAPRO ) tablet 37.5 mg  37.5 mg Oral Daily Ondrea Dow, MD   37.5 mg at 11/18/24 1008   magnesium  hydroxide (MILK OF MAGNESIA) suspension 30 mL  30 mL Oral Daily PRN McLauchlin, Jon, NP       metoprolol  tartrate (LOPRESSOR ) tablet 25 mg  25 mg Oral BID PRN Nayla Dias, MD   25 mg at 11/17/24 9380   multivitamin with minerals tablet 1 tablet  1 tablet Oral q morning Sharanda Shinault, MD   1 tablet at 11/18/24 1008   naproxen  (NAPROSYN ) tablet 250 mg  250 mg Oral BID WC Hanaa Payes, MD   250 mg at 11/18/24 0830   nicotine  (NICODERM CQ  - dosed in mg/24 hours) patch 14 mg  14 mg Transdermal Daily Stasha Naraine, MD   14 mg at 11/18/24 1010   pantoprazole  (PROTONIX ) EC tablet 40 mg  40 mg Oral Daily PRN Deegan Valentino, MD       rOPINIRole  (REQUIP ) tablet 1 mg  1 mg Oral QHS Osmani Kersten, MD   1 mg at 11/17/24 2144   simvastatin  (ZOCOR ) tablet 20 mg  20 mg Oral QHS Padraig Nhan, MD   20 mg at 11/17/24 2144   traZODone  (DESYREL ) tablet 50 mg  50 mg Oral QHS PRN McLauchlin, Angela, NP   50 mg at 11/17/24 2144   venlafaxine  XR (EFFEXOR -XR) 24 hr capsule 150 mg  150 mg Oral Q breakfast Axie Hayne, MD   150 mg at 11/18/24 0830    Lab Results:  No results found for this or any previous visit (from the past 48 hours).   Blood Alcohol   level:  Lab Results  Component Value Date   [facility] <15 11/07/2024    Metabolic Disorder Labs: No results found for: HGBA1C, MPG Lab Results  Component Value Date   PROLACTIN 8.7 11/07/2024   Lab Results  Component Value Date   CHOL 174 03/07/2024   TRIG 62 03/07/2024   HDL 62 03/07/2024   CHOLHDL 2.8 03/07/2024   VLDL 28 11/18/2007   LDLCALC 100 (H) 03/07/2024   LDLCALC (H) 11/18/2007    183        Total Cholesterol/HDL:CHD Risk Coronary Heart Disease Risk Table                     Men   Women  1/2 Average Risk   3.4   3.3    Physical Findings: AIMS:  , ,  ,  ,    CIWA:    COWS:      Psychiatric Specialty Exam:  Presentation  General Appearance:  Appropriate for Environment; Casual  Eye Contact: Minimal  Speech: Normal Rate  Speech Volume: Decreased    Mood and Affect  Mood: Anxious; Depressed  Affect: Depressed; Flat   Thought Process  Thought Processes: Coherent  Orientation:Full (Time, Place and Person)  Thought Content:Logical  Hallucinations: Denies  Ideas of Reference:None  Suicidal Thoughts:  Denies active SI  Homicidal Thoughts: Denies   Sensorium  Memory: Immediate Fair; Remote Fair  Judgment: Impaired  Insight: Shallow   Executive Functions  Concentration: Fair  Attention Span: Fair  Recall: [employer] of Knowledge: Fair  Language: Fair   Psychomotor Activity  Psychomotor Activity: No data recorded  Musculoskeletal: Strength & Muscle Tone: within normal limits Gait & Station: normal Assets  Assets: Manufacturing Systems Engineer; Desire for Improvement    Physical Exam: Physical Exam Vitals and nursing note reviewed.    ROS Blood pressure 106/64, pulse 87, temperature 98.1 F (36.7 C), resp. rate 18, height 5' 2 (1.575 m), weight 51.3 kg, SpO2 98%. Body mass index is 20.67 kg/m.  Diagnosis: Active Problems:   Bipolar I disorder, most recent episode depressed [facility])   Clinical Decision  Making: Patient is currently admitted after a suicide attempt by cutting herself in the context of worsening depression and grieving after the loss of her father and boyfriend.  She will be monitored closely on the inpatient unit  Treatment Plan Summary:  Safety and Monitoring:             -- Voluntary admission to inpatient psychiatric unit for safety, stabilization and treatment             -- Daily contact with patient to assess and evaluate symptoms and progress in treatment             -- Patient's case to be discussed in multi-disciplinary team meeting             -- Observation Level: q15 minute checks             -- Vital signs:  q12 hours             -- Precautions: suicide, elopement, and assault   2. Psychiatric Diagnoses and Treatment:                Klonopin  0. 2 5 mg nightly and 0. 2 5 mg as needed daily  Effexor  XR increased to 150 mg daily Reduced her gabapentin  to 300 mg 3 times daily -- The risks/benefits/side-effects/alternatives to this medication were discussed in detail with the patient and time was given for questions. The patient consents to medication trial.                -- Metabolic profile and EKG monitoring obtained while on an atypical antipsychotic (BMI: Lipid Panel: HbgA1c: QTc:)              -- Encouraged patient to participate in unit milieu and in scheduled group therapies                            3. Medical Issues Being Addressed:  Dizziness-PT consult is done for further guidance 4. Discharge Planning:   -- Social work and case management to assist with discharge planning and identification of hospital follow-up needs prior to discharge  -- Estimated LOS: 3-4 days  Allyn Foil, MD 11/18/2024, 12:27 PM

## 2024-11-18 NOTE — Progress Notes (Signed)
(  Sleep Hours) - 6.75 (Any PRNs that were needed, meds refused, or side effects to meds)- Trazadone partially effective (Any disturbances and when (visitation, over night)-  Melatonin one time dose administered when patient awaken d/t sleep disturbance (Concerns raised by the patient)- Provider contacted after pt request of sleep 2/10 (SI/HI/AVH)- Denies

## 2024-11-18 NOTE — BHH Counselor (Signed)
 CSW contacted pt's mother per provider's request as pt's reported she is able to stay with her mother, Dickey [phone]).   Dickey reports that pt CANNOT stay with her until the end of the month.   CSW informed provider.   Lum Croft, MSW, [place] 11/18/2024 3:24 PM

## 2024-11-18 NOTE — Group Note (Signed)
 Date:  11/18/2024 Time:  10:48 AM  Group Topic/Focus:  Stages of Change:   The focus of this group is to explain the stages of change and help patients identify changes they want to make upon discharge.    Participation Level:  Active  Participation Quality:  Appropriate  Affect:  excited   Cognitive:  Alert  Insight: Good  Engagement in Group:  Engaged  Modes of Intervention:  Activity, Discussion, and Education  Additional Comments:    Norleen SHAUNNA Bias 11/18/2024, 10:48 AM

## 2024-11-18 NOTE — Group Note (Signed)
 Date:  11/18/2024 Time:  10:27 PM  Group Topic/Focus:  Self Care:   The focus of this group is to help patients understand the importance of self-care in order to improve or restore emotional, physical, spiritual, interpersonal, and financial health.    Participation Level:  Active  Participation Quality:  Appropriate  Affect:  Appropriate  Cognitive:  Appropriate  Insight: Appropriate  Engagement in Group:  Engaged  Modes of Intervention:  Discussion  Additional Comments:    Laymon ONEIDA Finder 11/18/2024, 10:27 PM

## 2024-11-19 DIAGNOSIS — F319 Bipolar disorder, unspecified: Secondary | ICD-10-CM | POA: Diagnosis not present

## 2024-11-19 NOTE — Progress Notes (Signed)
 SI/HI: denies  Behavior/Mood: alert and oriented. Bright affect. Endorses anxiety 6/10.   Interaction/Group:visible in milieu. Interacting with peers and staff.   Medications/PRNs: po med compliant. PRNs given for anxiety, pain and insomnia.  Pain: neck 5/10  Other: slept 9.75 hours   11/18/24 2100  Psych Admission Type (Psych Patients Only)  Admission Status Voluntary  Psychosocial Assessment  Patient Complaints Anxiety  Eye Contact Fair  Facial Expression Animated  Affect Anxious  Speech Logical/coherent  Interaction Assertive  Motor Activity Slow  Appearance/Hygiene Unremarkable  Behavior Characteristics Cooperative  Mood Anxious  Thought Process  Coherency WDL  Content WDL  Delusions None reported or observed  Perception WDL  Hallucination None reported or observed  Judgment Impaired  Confusion None  Danger to Self  Current suicidal ideation? Denies

## 2024-11-19 NOTE — Group Note (Signed)
 Date:  11/19/2024 Time:  10:21 PM  Group Topic/Focus:  Identifying Needs:   The focus of this group is to help patients identify their personal needs that have been historically problematic and identify healthy behaviors to address their needs.    Participation Level:  Active  Participation Quality:  Appropriate  Affect:  Appropriate  Cognitive:  Appropriate  Insight: Appropriate  Engagement in Group:  Engaged  Modes of Intervention:  Education  Additional Comments:    Laymon ONEIDA Finder 11/19/2024, 10:21 PM

## 2024-11-19 NOTE — Group Note (Signed)
"                                                 [facility] LCSW Group Therapy Note    Group Date: 11/19/2024 Start Time: 1328 End Time: 1358  Type of Therapy and Topic:  Group Therapy:  Overcoming Obstacles  Participation Level:  BHH PARTICIPATION LEVEL: Active  Mood: pleasant, calm  Description of Group:   In this group patients will be encouraged to explore what they see as obstacles to their own wellness and recovery. They will be guided to discuss their thoughts, feelings, and behaviors related to these obstacles. The group will process together ways to cope with barriers, with attention given to specific choices patients can make. Each patient will be challenged to identify changes they are motivated to make in order to overcome their obstacles. This group will be process-oriented, with patients participating in exploration of their own experiences as well as giving and receiving support and challenge from other group members.  Therapeutic Goals: 1. Patient will identify personal and current obstacles as they relate to admission. 2. Patient will identify barriers that currently interfere with their wellness or overcoming obstacles.  3. Patient will identify feelings, thought process and behaviors related to these barriers. 4. Patient will identify two changes they are willing to make to overcome these obstacles:    Summary of Patient Progress  The facilitator and patient discussed things they could control and not control.  Group members create an individual statement on what they could not control.  The facilitator and patient examine how different experiences can influence their  mood.  The patient reflected on how their behaviors shape their current control and things that they can not control.  This distinction allows the patient to honor their limitations and channel their efforts where they will make a difference.    Therapeutic Modalities:   Cognitive Behavioral Therapy Solution  Focused Therapy Motivational Interviewing Relapse Prevention Therapy   [name] Hinton, LCSWA "

## 2024-11-19 NOTE — Plan of Care (Signed)
   Problem: Education: Goal: Ability to state activities that reduce stress will improve Outcome: Progressing   Problem: Coping: Goal: Ability to identify and develop effective coping behavior will improve Outcome: Progressing   Problem: Self-Concept: Goal: Ability to identify factors that promote anxiety will improve Outcome: Progressing Goal: Level of anxiety will decrease Outcome: Progressing

## 2024-11-19 NOTE — Plan of Care (Signed)
 ?  Problem: Coping: ?Goal: Ability to identify and develop effective coping behavior will improve ?Outcome: Progressing ?  ?Problem: Self-Concept: ?Goal: Ability to identify factors that promote anxiety will improve ?Outcome: Progressing ?Goal: Level of anxiety will decrease ?Outcome: Progressing ?Goal: Ability to modify response to factors that promote anxiety will improve ?Outcome: Progressing ?  ?

## 2024-11-19 NOTE — Group Note (Signed)
 Date:  11/19/2024 Time:  10:35 AM  Group Topic/Focus:  Movement Therapy Morning strech with Koula Venier    Participation Level:  Active  Participation Quality:  Appropriate  Affect:  Appropriate  Cognitive:  Appropriate  Insight: Appropriate  Engagement in Group:  Engaged  Modes of Intervention:  Activity  Additional Comments:  none  [name] Hinton Bias 11/19/2024, 10:35 AM

## 2024-11-19 NOTE — Group Note (Deleted)
 Date:  11/19/2024 Time:  10:31 AM  Group Topic/Focus:  Movement Therapy Morning strech with Avie Checo    Participation Level:  Did Not Attend    Norleen SHAUNNA Bias 11/19/2024, 10:31 AM

## 2024-11-19 NOTE — Group Note (Signed)
 Date:  11/19/2024 Time:  4:31 PM  Group Topic/Focus:  Managing Feelings:   The focus of this group is to identify what feelings patients have difficulty handling and develop a plan to handle them in a healthier way upon discharge.    Participation Level:  Did Not Attend   Larrie Leita BRAVO 11/19/2024, 4:31 PM

## 2024-11-19 NOTE — Progress Notes (Signed)
" °   11/19/24 1100  Psych Admission Type (Psych Patients Only)  Admission Status Voluntary  Psychosocial Assessment  Patient Complaints Anxiety  Eye Contact Fair  Facial Expression Animated  Affect Appropriate to circumstance  Speech Logical/coherent  Interaction Assertive  Motor Activity Unsteady  Appearance/Hygiene In scrubs  Behavior Characteristics Cooperative  Mood Anxious;Pleasant  Thought Process  Coherency WDL  Content WDL  Delusions None reported or observed  Perception WDL  Hallucination None reported or observed  Judgment Impaired  Confusion None  Danger to Self  Current suicidal ideation? Denies  Danger to Others  Danger to Others None reported or observed    "

## 2024-11-19 NOTE — BH IP Treatment Plan (Signed)
 Interdisciplinary Treatment and Diagnostic Plan Update  11/19/2024 Time of Session: 3:00 PM  [name] Hinton MRN: [id number]  Principal Diagnosis: Bipolar 1 disorder (HCC)  Secondary Diagnoses: Active Problems:   Bipolar I disorder, most recent episode depressed (HCC)   Current Medications:  Current Facility-Administered Medications  Medication Dose Route Frequency Provider Last Rate Last Admin   acetaminophen  (TYLENOL ) tablet 650 mg  650 mg Oral Q6H PRN McLauchlin, Angela, NP   650 mg at 11/18/24 2202   alum & mag hydroxide-simeth (MAALOX/MYLANTA) 200-200-20 MG/5ML suspension 30 mL  30 mL Oral Q4H PRN McLauchlin, Angela, NP   30 mL at 11/13/24 1457   artificial tears ophthalmic solution 1 drop  1 drop Both Eyes TID PRN Jadapalle, Sree, MD       aspirin  EC tablet 81 mg  81 mg Oral QHS Jadapalle, Sree, MD   81 mg at 11/18/24 2206   clonazePAM  (KLONOPIN ) disintegrating tablet 0.25 mg  0.25 mg Oral BID PRN Jadapalle, Sree, MD   0.25 mg at 11/17/24 1158   haloperidol  (HALDOL ) tablet 5 mg  5 mg Oral TID PRN McLauchlin, Angela, NP       And   diphenhydrAMINE  (BENADRYL ) capsule 50 mg  50 mg Oral TID PRN McLauchlin, Angela, NP       haloperidol  lactate (HALDOL ) injection 5 mg  5 mg Intramuscular TID PRN McLauchlin, Jon, NP       And   diphenhydrAMINE  (BENADRYL ) injection 50 mg  50 mg Intramuscular TID PRN McLauchlin, Jon, NP       And   LORazepam  (ATIVAN ) injection 2 mg  2 mg Intramuscular TID PRN McLauchlin, Jon, NP       haloperidol  lactate (HALDOL ) injection 10 mg  10 mg Intramuscular TID PRN McLauchlin, Jon, NP       And   diphenhydrAMINE  (BENADRYL ) injection 50 mg  50 mg Intramuscular TID PRN McLauchlin, Jon, NP       And   LORazepam  (ATIVAN ) injection 2 mg  2 mg Intramuscular TID PRN McLauchlin, Angela, NP       gabapentin  (NEURONTIN ) capsule 300 mg  300 mg Oral TID Jadapalle, Sree, MD   300 mg at 11/19/24 9057   spironolactone  (ALDACTONE ) tablet 25 mg  25 mg Oral Daily  Greenwood, Howard F, RPH   25 mg at 11/19/24 9049   And   hydrochlorothiazide  (HYDRODIURIL ) tablet 25 mg  25 mg Oral Daily Greenwood, Howard F, RPH   25 mg at 11/19/24 9056   hydrOXYzine  (ATARAX ) tablet 25 mg  25 mg Oral TID PRN McLauchlin, Angela, NP   25 mg at 11/19/24 9057   irbesartan  (AVAPRO ) tablet 37.5 mg  37.5 mg Oral Daily Jadapalle, Sree, MD   37.5 mg at 11/19/24 9050   magnesium  hydroxide (MILK OF MAGNESIA) suspension 30 mL  30 mL Oral Daily PRN McLauchlin, Angela, NP       metoprolol  tartrate (LOPRESSOR ) tablet 25 mg  25 mg Oral BID PRN Jadapalle, Sree, MD   25 mg at 11/17/24 9380   multivitamin with minerals tablet 1 tablet  1 tablet Oral q morning Jadapalle, Sree, MD   1 tablet at 11/19/24 9056   naproxen  (NAPROSYN ) tablet 250 mg  250 mg Oral BID WC Jadapalle, Sree, MD   250 mg at 11/19/24 0751   nicotine  (NICODERM CQ  - dosed in mg/24 hours) patch 14 mg  14 mg Transdermal Daily Jadapalle, Sree, MD   14 mg at 11/19/24 0944   pantoprazole  (PROTONIX ) EC tablet  40 mg  40 mg Oral Daily PRN Jadapalle, Sree, MD       rOPINIRole  (REQUIP ) tablet 1 mg  1 mg Oral QHS Jadapalle, Sree, MD   1 mg at 11/18/24 2202   simvastatin  (ZOCOR ) tablet 20 mg  20 mg Oral QHS Jadapalle, Sree, MD   20 mg at 11/18/24 2203   traZODone  (DESYREL ) tablet 50 mg  50 mg Oral QHS PRN McLauchlin, Angela, NP   50 mg at 11/18/24 2202   venlafaxine  XR (EFFEXOR -XR) 24 hr capsule 150 mg  150 mg Oral Q breakfast Jadapalle, Sree, MD   150 mg at 11/19/24 0751   PTA Medications: Medications Prior to Admission  Medication Sig Dispense Refill Last Dose/Taking   ARTIFICIAL TEARS PF 0.1-0.3 % SOLN Place 1 drop into both eyes 3 (three) times daily as needed (for dryness).      aspirin  EC 81 MG tablet Take 81 mg by mouth at bedtime. Swallow whole.      Aspirin -Acetaminophen -Caffeine (GOODY HEADACHE PO) Take 1 packet by mouth as needed (for headaches).      buPROPion  (WELLBUTRIN  XL) 150 MG 24 hr tablet Take 150 mg by mouth every  morning.      Calcium  Carb-Cholecalciferol 600-5 MG-MCG TABS Take 1 tablet by mouth daily.      clonazePAM  (KLONOPIN ) 0.5 MG tablet Take 0.5 mg by mouth 2 (two) times daily as needed for anxiety.      gabapentin  (NEURONTIN ) 300 MG capsule TAKE 1 CAPSULE BY MOUTH THREE TIMES A DAY 270 capsule 2    ibuprofen (ADVIL) 200 MG tablet Take 800 mg by mouth every 6 (six) hours as needed for headache or mild pain.      metoprolol  tartrate (LOPRESSOR ) 50 MG tablet TAKE 1 TABLET BY MOUTH 2 TIMES DAILY AS NEEDED (MAY TAKE 50MG  1-2 TIMES DAILY FOR IRREGULAR HEART BEAT). 180 tablet 1    Multiple Vitamin (MULTIVITAMIN WITH MINERALS) TABS Take 1 tablet by mouth every morning.      naproxen  sodium (ALEVE ) 220 MG tablet Take 220-440 mg by mouth 2 (two) times daily as needed (for mild pain or headaches).      omeprazole (PRILOSEC OTC) 20 MG tablet Take 20 mg by mouth daily as needed (for reflux).      rOPINIRole  (REQUIP ) 1 MG tablet Take 1 mg by mouth at bedtime.      simvastatin  (ZOCOR ) 20 MG tablet Take 20 mg by mouth at bedtime.      spironolactone -hydrochlorothiazide  (ALDACTAZIDE) 25-25 MG tablet Take 1 tablet by mouth daily. 30 tablet 11    TYLENOL  8 HOUR 650 MG CR tablet Take 650-1,300 mg by mouth every 8 (eight) hours as needed for pain (or headaches).      valsartan (DIOVAN) 320 MG tablet Take 320 mg by mouth daily.      VRAYLAR  1.5 MG capsule Take 1.5 mg by mouth daily.       Patient Stressors:    Patient Strengths:    Treatment Modalities: Medication Management, Group therapy, Case management,  1 to 1 session with clinician, Psychoeducation, Recreational therapy.   Physician Treatment Plan for Primary Diagnosis: Bipolar 1 disorder (HCC) Long Term Goal(s): Improvement in symptoms so as ready for discharge   Short Term Goals: Ability to identify changes in lifestyle to reduce recurrence of condition will improve Ability to verbalize feelings will improve Ability to disclose and discuss suicidal  ideas Ability to demonstrate self-control will improve Ability to identify and develop effective coping behaviors will improve  Medication Management: Evaluate patient's response, side effects, and tolerance of medication regimen.  Therapeutic Interventions: 1 to 1 sessions, Unit Group sessions and Medication administration.  Evaluation of Outcomes: Progressing  Physician Treatment Plan for Secondary Diagnosis: Active Problems:   Bipolar I disorder, most recent episode depressed (HCC)  Long Term Goal(s): Improvement in symptoms so as ready for discharge   Short Term Goals: Ability to identify changes in lifestyle to reduce recurrence of condition will improve Ability to verbalize feelings will improve Ability to disclose and discuss suicidal ideas Ability to demonstrate self-control will improve Ability to identify and develop effective coping behaviors will improve     Medication Management: Evaluate patient's response, side effects, and tolerance of medication regimen.  Therapeutic Interventions: 1 to 1 sessions, Unit Group sessions and Medication administration.  Evaluation of Outcomes: Progressing   RN Treatment Plan for Primary Diagnosis: Bipolar 1 disorder (HCC) Long Term Goal(s): Knowledge of disease and therapeutic regimen to maintain health will improve  Short Term Goals: Ability to remain free from injury will improve, Ability to verbalize frustration and anger appropriately will improve, Ability to demonstrate self-control, Ability to participate in decision making will improve, Ability to verbalize feelings will improve, Ability to disclose and discuss suicidal ideas, Ability to identify and develop effective coping behaviors will improve, and Compliance with prescribed medications will improve  Medication Management: RN will administer medications as ordered by provider, will assess and evaluate patient's response and provide education to patient for prescribed  medication. RN will report any adverse and/or side effects to prescribing provider.  Therapeutic Interventions: 1 on 1 counseling sessions, Psychoeducation, Medication administration, Evaluate responses to treatment, Monitor vital signs and CBGs as ordered, Perform/monitor CIWA, COWS, AIMS and Fall Risk screenings as ordered, Perform wound care treatments as ordered.  Evaluation of Outcomes: Progressing   LCSW Treatment Plan for Primary Diagnosis: Bipolar 1 disorder (HCC) Long Term Goal(s): Safe transition to appropriate next level of care at discharge, Engage patient in therapeutic group addressing interpersonal concerns.  Short Term Goals: Engage patient in aftercare planning with referrals and resources, Increase social support, Increase ability to appropriately verbalize feelings, Increase emotional regulation, Facilitate acceptance of mental health diagnosis and concerns, Facilitate patient progression through stages of change regarding substance use diagnoses and concerns, Identify triggers associated with mental health/substance abuse issues, and Increase skills for wellness and recovery  Therapeutic Interventions: Assess for all discharge needs, 1 to 1 time with Social worker, Explore available resources and support systems, Assess for adequacy in community support network, Educate family and significant other(s) on suicide prevention, Complete Psychosocial Assessment, Interpersonal group therapy.  Evaluation of Outcomes: Progressing   Progress in Treatment: Attending groups: Yes. Participating in groups: Yes. Taking medication as prescribed: Yes.  Toleration medication: Yes.  Family/Significant other contact made: Yes. Dickey Ramus, mother, [phone]. Mother declined SPE.  Patient understands diagnosis: Yes.  Discussing patient identified problems/goals with staff: Yes.  Medical problems stabilized or resolved: Yes.  Denies suicidal/homicidal ideation: No.  Issues/concerns per  patient self-inventory: No.  Other: None    New problem(s) identified: No, Describe:  None identified 11/14/24 Update: None. Update 11/19/24: No changes at this time    New Short Term/Long Term Goal(s):  elimination of symptoms of psychosis, medication management for mood stabilization; elimination of SI thoughts; development of comprehensive mental wellness plan. 11/14/24 Update: No changes at this time. Update 11/19/24: No changes at this time    Patient Goals:  Get back on my medications and clear my head  of the thoughts of not wanting to live 11/14/24 Update: No changes at this time. Update 11/19/24: No changes at this time    Discharge Plan or Barriers: CSW will assist with appropriate discharge planning 11/14/24 Update: No changes at this time. CSW to continue to engage with team and patient for safe discharge planning. Update 11/19/24: No changes at this time    Reason for Continuation of Hospitalization: Depression Medication stabilization Suicidal ideation 11/14/24 Update: No changes at this time.    Estimated Length of Stay: 1 to 7 days 11/14/24 Update: TBD.  Update 11/19/24: TBD  Last 3 [place] Suicide Severity Risk Score: Flowsheet Row Admission (Current) from 11/08/2024 in [facility] from 11[facility]07/2024 in [facility]  C-SSRS RISK CATEGORY High Risk High Risk Low Risk    Last PHQ 2/9 Scores:    10/02/2021    3:54 PM  Depression screen PHQ 2/9  Decreased Interest 3  Down, Depressed, Hopeless 3  PHQ - 2 Score 6  Altered sleeping 3  Tired, decreased energy 3  Change in appetite 0  Feeling bad or failure about yourself  3  Trouble concentrating 3  Moving slowly or fidgety/restless 0  Suicidal thoughts 3  PHQ-9 Score 21   Difficult doing work/chores Somewhat difficult     Data saved with a previous flowsheet row definition    Scribe for  Treatment Team: Lum JONETTA Croft, ISRAEL 11/19/2024 3:02 PM

## 2024-11-20 DIAGNOSIS — F319 Bipolar disorder, unspecified: Secondary | ICD-10-CM | POA: Diagnosis not present

## 2024-11-20 MED ORDER — TRAZODONE HCL 50 MG PO TABS
50.0000 mg | ORAL_TABLET | Freq: Every evening | ORAL | Status: DC | PRN
  Administered 2024-11-21 – 2024-11-27 (×6): 50 mg via ORAL
  Filled 2024-11-20 (×6): qty 1

## 2024-11-20 NOTE — Group Note (Signed)
 Date:  11/20/2024 Time:  11:14 PM  Group Topic/Focus:  Wrap-Up Group:   The focus of this group is to help patients review their daily goal of treatment and discuss progress on daily workbooks.    Participation Level:  Did Not Attend  Participation Quality:     Affect:     Cognitive:     Insight: None  Engagement in Group:  None  Modes of Intervention:     Additional Comments:    [name] [name] Hinton 11/20/2024, 11:14 PM

## 2024-11-20 NOTE — Plan of Care (Signed)
  Problem: Coping: Goal: Ability to identify and develop effective coping behavior will improve Outcome: Progressing   Problem: Self-Concept: Goal: Level of anxiety will decrease Outcome: Not Progressing

## 2024-11-20 NOTE — Progress Notes (Signed)
 [facility] MD Progress Note  11/19/2024 8:43 AM [name] Hinton  MRN:  [id number]  Patient is a 63 year old female with a history of Bipolar Disorder who presents voluntarily to [facility] for assessment. Patient presents via [place] [facility] via [employer] with chief complaint of suicide attempt. [employer] was contacted by patient's mother after patient place a note on the bathroom door stating: Mom do not open this door!  Just call 911, and let them come in here.  You do not need to see me.  I am sorry!  This is what you need to tell the 911 operator: Tell them I told you not to come in.  I have slit my wrist and bled out.  I am dead (I hope!).         Patient denies active SI/HI/plan and denies hallucinations.  She reports that she is ready for discharge after talking to her mom.  Treatment team discussed to reach out to patient's mom and confirm if patient is welcome back home.  Patient denies any side effects to medications.  She reports fair appetite and sleep  Past Psychiatric History: see h&P Family History:  Family History  Problem Relation Age of Onset   Breast cancer Neg Hx    Social History:  Social History   Substance and Sexual Activity  Alcohol  Use No     Social History   Substance and Sexual Activity  Drug Use No    Social History   Socioeconomic History   Marital status: Divorced    Spouse name: Not on file   Number of children: Not on file   Years of education: Not on file   Highest education level: Not on file  Occupational History   Not on file  Tobacco Use   Smoking status: Some Days    Types: Cigarettes   Smokeless tobacco: Never   Tobacco comments:    03/07/2024 patient smokes about 4-5 cigarettes some days    02/09/2023 A pack last patient about a week  Substance and Sexual Activity   Alcohol  use: No   Drug use: No   Sexual activity: Yes    Birth control/protection: Surgical  Other Topics Concern   Not on file  Social History Narrative   Not on file    Social Drivers of Health   Tobacco Use: High Risk (11/08/2024)   Patient History    Smoking Tobacco Use: Some Days    Smokeless Tobacco Use: Never    Passive Exposure: Not on file  Financial Resource Strain: Not on File (09/25/2022)   Received from [employer] [facility]    Financial Resource Strain: 0  Food Insecurity: No Food Insecurity (11/08/2024)   Epic    Worried About Programme Researcher, Broadcasting/film/video in the Last Year: Never true    Ran Out of Food in the Last Year: Never true  Transportation Needs: No Transportation Needs (11/08/2024)   Epic    Lack of Transportation (Medical): No    Lack of Transportation (Non-Medical): No  Physical Activity: Not on File (03/20/2022)   Received from [facility]   Physical Activity    Physical Activity: 0  Stress: Not on File (03/20/2022)   Received from [facility]   Stress    Stress: 0  Social Connections: Not on File (08/10/2023)   Received from [facility]   Social Connections    Connectedness: 0  Depression (PHQ2-9): Not on file  Alcohol  Screen: Low Risk (11/07/2024)   Alcohol  Screen  Last Alcohol  Screening Score (AUDIT): 1  Housing: Low Risk (11/08/2024)   Epic    Unable to Pay for Housing in the Last Year: No    Number of Times Moved in the Last Year: 0    Homeless in the Last Year: No  Utilities: Not At Risk (11/08/2024)   Epic    Threatened with loss of utilities: No  Health Literacy: Not on file   Past Medical History:  Past Medical History:  Diagnosis Date   Anxiety    Bipolar affective (HCC)    Chronic headaches    Depression    Hypercholesteremia    Hypertension    Restless leg syndrome     Past Surgical History:  Procedure Laterality Date   ABDOMINAL HYSTERECTOMY     OOPHORECTOMY     TONSILLECTOMY      Current Medications: Current Facility-Administered Medications  Medication Dose Route Frequency Provider Last Rate Last Admin   acetaminophen  (TYLENOL ) tablet 650 mg  650 mg Oral Q6H PRN McLauchlin, Angela, NP   650 mg at  11/19/24 2134   alum & mag hydroxide-simeth (MAALOX/MYLANTA) 200-200-20 MG/5ML suspension 30 mL  30 mL Oral Q4H PRN McLauchlin, Angela, NP   30 mL at 11/13/24 1457   artificial tears ophthalmic solution 1 drop  1 drop Both Eyes TID PRN Jadapalle, Sree, MD       aspirin  EC tablet 81 mg  81 mg Oral QHS Jadapalle, Sree, MD   81 mg at 11/19/24 2135   clonazePAM  (KLONOPIN ) disintegrating tablet 0.25 mg  0.25 mg Oral BID PRN Jadapalle, Sree, MD   0.25 mg at 11/17/24 1158   haloperidol  (HALDOL ) tablet 5 mg  5 mg Oral TID PRN McLauchlin, Angela, NP       And   diphenhydrAMINE  (BENADRYL ) capsule 50 mg  50 mg Oral TID PRN McLauchlin, Angela, NP       haloperidol  lactate (HALDOL ) injection 5 mg  5 mg Intramuscular TID PRN McLauchlin, Angela, NP       And   diphenhydrAMINE  (BENADRYL ) injection 50 mg  50 mg Intramuscular TID PRN McLauchlin, Jon, NP       And   LORazepam  (ATIVAN ) injection 2 mg  2 mg Intramuscular TID PRN McLauchlin, Jon, NP       haloperidol  lactate (HALDOL ) injection 10 mg  10 mg Intramuscular TID PRN McLauchlin, Angela, NP       And   diphenhydrAMINE  (BENADRYL ) injection 50 mg  50 mg Intramuscular TID PRN McLauchlin, Angela, NP       And   LORazepam  (ATIVAN ) injection 2 mg  2 mg Intramuscular TID PRN McLauchlin, Angela, NP       gabapentin  (NEURONTIN ) capsule 300 mg  300 mg Oral TID Jadapalle, Sree, MD   300 mg at 11/19/24 2135   spironolactone  (ALDACTONE ) tablet 25 mg  25 mg Oral Daily Greenwood, Howard F, RPH   25 mg at 11/19/24 9049   And   hydrochlorothiazide  (HYDRODIURIL ) tablet 25 mg  25 mg Oral Daily Niels Kayla FALCON, RPH   25 mg at 11/19/24 9056   hydrOXYzine  (ATARAX ) tablet 25 mg  25 mg Oral TID PRN McLauchlin, Angela, NP   25 mg at 11/19/24 2135   irbesartan  (AVAPRO ) tablet 37.5 mg  37.5 mg Oral Daily Jadapalle, Sree, MD   37.5 mg at 11/19/24 9050   magnesium  hydroxide (MILK OF MAGNESIA) suspension 30 mL  30 mL Oral Daily PRN McLauchlin, Jon, NP       metoprolol   tartrate (LOPRESSOR ) tablet 25 mg  25 mg Oral BID PRN Jadapalle, Sree, MD   25 mg at 11/17/24 9380   multivitamin with minerals tablet 1 tablet  1 tablet Oral q morning Jadapalle, Sree, MD   1 tablet at 11/19/24 9056   naproxen  (NAPROSYN ) tablet 250 mg  250 mg Oral BID WC Jadapalle, Sree, MD   250 mg at 11/20/24 0802   nicotine  (NICODERM CQ  - dosed in mg/24 hours) patch 14 mg  14 mg Transdermal Daily Jadapalle, Sree, MD   14 mg at 11/19/24 0944   pantoprazole  (PROTONIX ) EC tablet 40 mg  40 mg Oral Daily PRN Jadapalle, Sree, MD       rOPINIRole  (REQUIP ) tablet 1 mg  1 mg Oral QHS Jadapalle, Sree, MD   1 mg at 11/19/24 2135   simvastatin  (ZOCOR ) tablet 20 mg  20 mg Oral QHS Jadapalle, Sree, MD   20 mg at 11/19/24 2134   traZODone  (DESYREL ) tablet 50 mg  50 mg Oral QHS PRN McLauchlin, Angela, NP   50 mg at 11/19/24 2134   venlafaxine  XR (EFFEXOR -XR) 24 hr capsule 150 mg  150 mg Oral Q breakfast Jadapalle, Sree, MD   150 mg at 11/20/24 9197    Lab Results:  No results found for this or any previous visit (from the past 48 hours).   Blood Alcohol  level:  Lab Results  Component Value Date   [facility] <15 11/07/2024    Metabolic Disorder Labs: No results found for: HGBA1C, MPG Lab Results  Component Value Date   PROLACTIN 8.7 11/07/2024   Lab Results  Component Value Date   CHOL 174 03/07/2024   TRIG 62 03/07/2024   HDL 62 03/07/2024   CHOLHDL 2.8 03/07/2024   VLDL 28 11/18/2007   LDLCALC 100 (H) 03/07/2024   LDLCALC (H) 11/18/2007    183        Total Cholesterol/HDL:CHD Risk Coronary Heart Disease Risk Table                     Men   Women  1/2 Average Risk   3.4   3.3    Physical Findings: AIMS:  , ,  ,  ,    CIWA:    COWS:      Psychiatric Specialty Exam:  Presentation  General Appearance:  Appropriate for Environment; Casual  Eye Contact: Minimal  Speech: Normal Rate  Speech Volume: Decreased    Mood and Affect  Mood: Anxious;  Depressed  Affect: Depressed; Flat   Thought Process  Thought Processes: Coherent  Orientation:Full (Time, Place and Person)  Thought Content:Logical  Hallucinations: Denies  Ideas of Reference:None  Suicidal Thoughts: Denies active SI  Homicidal Thoughts: Denies   Sensorium  Memory: Immediate Fair; Remote Fair  Judgment: Impaired  Insight: Shallow   Executive Functions  Concentration: Fair  Attention Span: Fair  Recall: [employer] of Knowledge: Fair  Language: Fair   Psychomotor Activity  Psychomotor Activity: No data recorded  Musculoskeletal: Strength & Muscle Tone: within normal limits Gait & Station: normal Assets  Assets: Manufacturing Systems Engineer; Desire for Improvement    Physical Exam: Physical Exam Vitals and nursing note reviewed.    ROS Blood pressure 102/70, pulse 91, temperature 98 F (36.7 C), resp. rate 18, height 5' 2 (1.575 m), weight 51.3 kg, SpO2 99%. Body mass index is 20.67 kg/m.  Diagnosis: Active Problems:   Bipolar I disorder, most recent episode depressed [facility])   Clinical Decision Making: Patient  is currently admitted after a suicide attempt by cutting herself in the context of worsening depression and grieving after the loss of her father and boyfriend.  She will be monitored closely on the inpatient unit  Treatment Plan Summary:  Safety and Monitoring:             -- Voluntary admission to inpatient psychiatric unit for safety, stabilization and treatment             -- Daily contact with patient to assess and evaluate symptoms and progress in treatment             -- Patient's case to be discussed in multi-disciplinary team meeting             -- Observation Level: q15 minute checks             -- Vital signs:  q12 hours             -- Precautions: suicide, elopement, and assault   2. Psychiatric Diagnoses and Treatment:                Klonopin  0. 2 5 mg nightly and 0. 2 5 mg as needed daily  Effexor   XR increased to 150 mg daily Reduced her gabapentin  to 300 mg 3 times daily -- The risks/benefits/side-effects/alternatives to this medication were discussed in detail with the patient and time was given for questions. The patient consents to medication trial.                -- Metabolic profile and EKG monitoring obtained while on an atypical antipsychotic (BMI: Lipid Panel: HbgA1c: QTc:)              -- Encouraged patient to participate in unit milieu and in scheduled group therapies                            3. Medical Issues Being Addressed:  Dizziness-PT consult is done for further guidance 4. Discharge Planning:   -- Social work and case management to assist with discharge planning and identification of hospital follow-up needs prior to discharge  -- Estimated LOS: 3-4 days  Millie JONELLE Manners, MD 11/20/2024, 8:43 AM

## 2024-11-20 NOTE — Progress Notes (Signed)
 [facility] MD Progress Note  11/19/2024 10:52 AM [name] Hinton  MRN:  [id number]  Patient is a 63 year old female with a history of Bipolar Disorder who presents voluntarily to [facility] for assessment. Patient presents via [place] via [employer] with chief complaint of suicide attempt. [employer] was contacted by patient's mother after patient place a note on the bathroom door stating: Mom do not open this door!  Just call 911, and let them come in here.  You do not need to see me.  I am sorry!  This is what you need to tell the 911 operator: Tell them I told you not to come in.  I have slit my wrist and bled out.  I am dead (I hope!).      Patient reports that she is waiting for placement and she denies any current suicidal or homicidal thoughts.  Past Psychiatric History: see h&P Family History:  Family History  Problem Relation Age of Onset   Breast cancer Neg Hx    Social History:  Social History   Substance and Sexual Activity  Alcohol  Use No     Social History   Substance and Sexual Activity  Drug Use No    Social History   Socioeconomic History   Marital status: Divorced    Spouse name: Not on file   Number of children: Not on file   Years of education: Not on file   Highest education level: Not on file  Occupational History   Not on file  Tobacco Use   Smoking status: Some Days    Types: Cigarettes   Smokeless tobacco: Never   Tobacco comments:    03/07/2024 patient smokes about 4-5 cigarettes some days    02/09/2023 A pack last patient about a week  Substance and Sexual Activity   Alcohol  use: No   Drug use: No   Sexual activity: Yes    Birth control/protection: Surgical  Other Topics Concern   Not on file  Social History Narrative   Not on file   Social Drivers of Health   Tobacco Use: High Risk (11/08/2024)   Patient History    Smoking Tobacco Use: Some Days    Smokeless Tobacco Use: Never    Passive Exposure: Not on file  Financial Resource  Strain: Not on File (09/25/2022)   Received from [employer]    Financial Resource Strain: 0  Food Insecurity: No Food Insecurity (11/08/2024)   Epic    Worried About Programme Researcher, Broadcasting/film/video in the Last Year: Never true    Ran Out of Food in the Last Year: Never true  Transportation Needs: No Transportation Needs (11/08/2024)   Epic    Lack of Transportation (Medical): No    Lack of Transportation (Non-Medical): No  Physical Activity: Not on File (03/20/2022)   Received from [facility]   Physical Activity    Physical Activity: 0  Stress: Not on File (03/20/2022)   Received from [facility]   Stress    Stress: 0  Social Connections: Not on File (08/10/2023)   Received [facility]   Social Connections    Connectedness: 0  Depression (PHQ2-9): Not on file  Alcohol  Screen: Low Risk (11/07/2024)   Alcohol  Screen    Last Alcohol  Screening Score (AUDIT): 1  Housing: Low Risk (11/08/2024)   Epic    Unable to Pay for Housing in the Last Year: No    Number of Times Moved in the Last  Year: 0    Homeless in the Last Year: No  Utilities: Not At Risk (11/08/2024)   Epic    Threatened with loss of utilities: No  Health Literacy: Not on file   Past Medical History:  Past Medical History:  Diagnosis Date   Anxiety    Bipolar affective (HCC)    Chronic headaches    Depression    Hypercholesteremia    Hypertension    Restless leg syndrome     Past Surgical History:  Procedure Laterality Date   ABDOMINAL HYSTERECTOMY     OOPHORECTOMY     TONSILLECTOMY      Current Medications: Current Facility-Administered Medications  Medication Dose Route Frequency Provider Last Rate Last Admin   acetaminophen  (TYLENOL ) tablet 650 mg  650 mg Oral Q6H PRN McLauchlin, Angela, NP   650 mg at 11/19/24 2134   alum & mag hydroxide-simeth (MAALOX/MYLANTA) 200-200-20 MG/5ML suspension 30 mL  30 mL Oral Q4H PRN McLauchlin, Angela, NP   30 mL at 11/13/24 1457   artificial tears ophthalmic solution 1  drop  1 drop Both Eyes TID PRN Jadapalle, Sree, MD       aspirin  EC tablet 81 mg  81 mg Oral QHS Jadapalle, Sree, MD   81 mg at 11/19/24 2135   clonazePAM  (KLONOPIN ) disintegrating tablet 0.25 mg  0.25 mg Oral BID PRN Jadapalle, Sree, MD   0.25 mg at 11/17/24 1158   haloperidol  (HALDOL ) tablet 5 mg  5 mg Oral TID PRN McLauchlin, Angela, NP       And   diphenhydrAMINE  (BENADRYL ) capsule 50 mg  50 mg Oral TID PRN McLauchlin, Angela, NP       haloperidol  lactate (HALDOL ) injection 5 mg  5 mg Intramuscular TID PRN McLauchlin, Angela, NP       And   diphenhydrAMINE  (BENADRYL ) injection 50 mg  50 mg Intramuscular TID PRN McLauchlin, Jon, NP       And   LORazepam  (ATIVAN ) injection 2 mg  2 mg Intramuscular TID PRN McLauchlin, Jon, NP       haloperidol  lactate (HALDOL ) injection 10 mg  10 mg Intramuscular TID PRN McLauchlin, Angela, NP       And   diphenhydrAMINE  (BENADRYL ) injection 50 mg  50 mg Intramuscular TID PRN McLauchlin, Angela, NP       And   LORazepam  (ATIVAN ) injection 2 mg  2 mg Intramuscular TID PRN McLauchlin, Angela, NP       gabapentin  (NEURONTIN ) capsule 300 mg  300 mg Oral TID Jadapalle, Sree, MD   300 mg at 11/20/24 9071   spironolactone  (ALDACTONE ) tablet 25 mg  25 mg Oral Daily Greenwood, Howard F, RPH   25 mg at 11/20/24 0940   And   hydrochlorothiazide  (HYDRODIURIL ) tablet 25 mg  25 mg Oral Daily Niels Kayla FALCON, RPH   25 mg at 11/20/24 9071   hydrOXYzine  (ATARAX ) tablet 25 mg  25 mg Oral TID PRN McLauchlin, Angela, NP   25 mg at 11/20/24 9071   irbesartan  (AVAPRO ) tablet 37.5 mg  37.5 mg Oral Daily Jadapalle, Sree, MD   37.5 mg at 11/20/24 9071   magnesium  hydroxide (MILK OF MAGNESIA) suspension 30 mL  30 mL Oral Daily PRN McLauchlin, Angela, NP       metoprolol  tartrate (LOPRESSOR ) tablet 25 mg  25 mg Oral BID PRN Jadapalle, Sree, MD   25 mg at 11/17/24 9380   multivitamin with minerals tablet 1 tablet  1 tablet Oral q morning Jadapalle, Sree,  MD   1 tablet at  11/20/24 9071   naproxen  (NAPROSYN ) tablet 250 mg  250 mg Oral BID WC Jadapalle, Sree, MD   250 mg at 11/20/24 9197   nicotine  (NICODERM CQ  - dosed in mg/24 hours) patch 14 mg  14 mg Transdermal Daily Jadapalle, Sree, MD   14 mg at 11/20/24 0940   pantoprazole  (PROTONIX ) EC tablet 40 mg  40 mg Oral Daily PRN Jadapalle, Sree, MD       rOPINIRole  (REQUIP ) tablet 1 mg  1 mg Oral QHS Jadapalle, Sree, MD   1 mg at 11/19/24 2135   simvastatin  (ZOCOR ) tablet 20 mg  20 mg Oral QHS Jadapalle, Sree, MD   20 mg at 11/19/24 2134   traZODone  (DESYREL ) tablet 50 mg  50 mg Oral QHS PRN McLauchlin, Angela, NP   50 mg at 11/19/24 2134   venlafaxine  XR (EFFEXOR -XR) 24 hr capsule 150 mg  150 mg Oral Q breakfast Jadapalle, Sree, MD   150 mg at 11/20/24 9197    Lab Results:  No results found for this or any previous visit (from the past 48 hours).   Blood Alcohol  level:  Lab Results  Component Value Date   [place] <15 11/07/2024    Metabolic Disorder Labs: No results found for: HGBA1C, MPG Lab Results  Component Value Date   PROLACTIN 8.7 11/07/2024   Lab Results  Component Value Date   CHOL 174 03/07/2024   TRIG 62 03/07/2024   HDL 62 03/07/2024   CHOLHDL 2.8 03/07/2024   VLDL 28 11/18/2007   LDLCALC 100 (H) 03/07/2024   LDLCALC (H) 11/18/2007    183        Total Cholesterol/HDL:CHD Risk Coronary Heart Disease Risk Table                     Men   Women  1/2 Average Risk   3.4   3.3    Physical Findings: AIMS:  , ,  ,  ,    CIWA:    COWS:      Psychiatric Specialty Exam:  Presentation  General Appearance:  Appropriate for Environment; Casual  Eye Contact: Minimal  Speech: Normal Rate  Speech Volume: Decreased    Mood and Affect  Mood: Anxious; Depressed  Affect: Depressed; Flat   Thought Process  Thought Processes: Coherent  Orientation:Full (Time, Place and Person)  Thought Content:Logical  Hallucinations: Denies  Ideas of Reference:None  Suicidal  Thoughts: Denies active SI  Homicidal Thoughts: Denies   Sensorium  Memory: Immediate Fair; Remote Fair  Judgment: Impaired  Insight: Shallow   Executive Functions  Concentration: Fair  Attention Span: Fair  Recall: [employer] of Knowledge: Fair  Language: Fair   Psychomotor Activity  Psychomotor Activity: No data recorded  Musculoskeletal: Strength & Muscle Tone: within normal limits Gait & Station: normal Assets  Assets: Manufacturing Systems Engineer; Desire for Improvement    Physical Exam: Physical Exam Vitals and nursing note reviewed.    ROS Blood pressure 102/70, pulse 91, temperature 98 F (36.7 C), resp. rate 18, height 5' 2 (1.575 m), weight 51.3 kg, SpO2 99%. Body mass index is 20.67 kg/m.  Diagnosis: Active Problems:   Bipolar I disorder, most recent episode depressed [facility])   Clinical Decision Making: Patient is currently admitted after a suicide attempt by cutting herself in the context of worsening depression and grieving after the loss of her father and boyfriend.  She will be monitored closely on the inpatient unit  Treatment Plan Summary:  Safety and Monitoring:             -- Voluntary admission to inpatient psychiatric unit for safety, stabilization and treatment             -- Daily contact with patient to assess and evaluate symptoms and progress in treatment             -- Patient's case to be discussed in multi-disciplinary team meeting             -- Observation Level: q15 minute checks             -- Vital signs:  q12 hours             -- Precautions: suicide, elopement, and assault   2. Psychiatric Diagnoses and Treatment:                Klonopin  0. 2 5 mg nightly and 0. 2 5 mg as needed daily  Effexor  XR increased to 150 mg daily Reduced her gabapentin  to 300 mg 3 times daily -- The risks/benefits/side-effects/alternatives to this medication were discussed in detail with the patient and time was given for questions. The  patient consents to medication trial.                -- Metabolic profile and EKG monitoring obtained while on an atypical antipsychotic (BMI: Lipid Panel: HbgA1c: QTc:)              -- Encouraged patient to participate in unit milieu and in scheduled group therapies                            3. Medical Issues Being Addressed:  Dizziness-PT consult is done for further guidance 4. Discharge Planning:   -- Social work and case management to assist with discharge planning and identification of hospital follow-up needs prior to discharge  -- Estimated LOS: 3-4 days  Millie JONELLE Manners, MD 11/20/2024, 10:52 AM

## 2024-11-20 NOTE — Progress Notes (Signed)
" °   11/19/24 2100  Psych Admission Type (Psych Patients Only)  Admission Status Voluntary  Psychosocial Assessment  Patient Complaints Anxiety  Eye Contact Fair  Facial Expression Animated  Affect Appropriate to circumstance  Speech Logical/coherent  Interaction Assertive  Motor Activity Unsteady  Appearance/Hygiene In scrubs  Behavior Characteristics Cooperative  Mood Pleasant  Thought Process  Coherency WDL  Content WDL  Delusions None reported or observed  Perception WDL  Hallucination None reported or observed  Judgment Impaired  Confusion None  Danger to Self  Current suicidal ideation? Denies    "

## 2024-11-20 NOTE — Plan of Care (Signed)

## 2024-11-20 NOTE — Group Note (Signed)
 Date:  11/20/2024 Time:  10:36 AM  Group Topic/Focus:  Movement Therapy, Morning Stretch with Katianna Mcclenney.    Participation Level:  Active  Participation Quality:  Appropriate  Affect:  Appropriate  Cognitive:  Appropriate  Insight: Appropriate  Engagement in Group:  Engaged  Modes of Intervention:  Activity  Additional Comments:  none  Norleen SHAUNNA Bias 11/20/2024, 10:36 AM

## 2024-11-20 NOTE — Group Note (Signed)
 Date:  11/20/2024 Time:  4:26 PM  Group Topic/Focus:  Wellness Toolbox:   The focus of this group is to discuss various aspects of wellness, balancing those aspects and exploring ways to increase the ability to experience wellness.  Patients will create a wellness toolbox for use upon discharge.    Participation Level:  Active  Participation Quality:  Appropriate and Attentive  Affect:  Appropriate and Excited  Cognitive:  Alert  Insight: Appropriate  Engagement in Group:  Engaged  Modes of Intervention:  Activity and Discussion  Additional Comments:     Maglione,Chandria Rookstool E 11/20/2024, 4:26 PM

## 2024-11-20 NOTE — Progress Notes (Signed)
" °   11/20/24 1100  Psych Admission Type (Psych Patients Only)  Admission Status Voluntary  Psychosocial Assessment  Patient Complaints Anxiety  Eye Contact Fair  Facial Expression Animated  Affect Appropriate to circumstance  Speech Logical/coherent  Interaction Assertive  Motor Activity Unsteady  Appearance/Hygiene Unremarkable  Behavior Characteristics Cooperative  Mood Pleasant  Thought Process  Coherency WDL  Content WDL  Delusions None reported or observed  Perception WDL  Hallucination None reported or observed  Judgment Impaired  Confusion None  Danger to Self  Current suicidal ideation? Denies    "

## 2024-11-21 DIAGNOSIS — F319 Bipolar disorder, unspecified: Secondary | ICD-10-CM | POA: Diagnosis not present

## 2024-11-21 MED ORDER — CLONAZEPAM 0.25 MG PO TBDP
0.2500 mg | ORAL_TABLET | Freq: Two times a day (BID) | ORAL | Status: DC
  Administered 2024-11-22 – 2024-11-26 (×9): 0.25 mg via ORAL
  Filled 2024-11-21 (×9): qty 1

## 2024-11-21 NOTE — Group Note (Signed)
 Date:  11/21/2024 Time:  4:52 PM  Group Topic/Focus:  Building Self Esteem:   The Focus of this group is helping patients become aware of the effects of self-esteem on their lives, the things they and others do that enhance or undermine their self-esteem, seeing the relationship between their level of self-esteem and the choices they make and learning ways to enhance self-esteem.    Participation Level:  Active  Participation Quality:  Appropriate  Affect:  Appropriate  Cognitive:  Appropriate  Insight: Appropriate  Engagement in Group:  Engaged  Modes of Intervention:  Discussion   [name] 11/21/2024, 4:52 PM

## 2024-11-21 NOTE — Group Note (Signed)
 Physical/Occupational Therapy Group Note  Group Topic: Transfer Training   Group Date: 11/21/2024 Start Time: 1315 End Time: 1340 Facilitators: Padme Arriaga, Alm Hamilton, PT   Group Description: Group educated on sequence and techniques to maximize safety with functional transfers.  Additionally, integrated education on impact of seating surfaces, use of assistive device and management of orthostasis with movement transitions.  Patients actively engaged with functional transfers (sit/stand) from various seating surfaces, with and without assist devices, working to integrate and retain education provided during session.  Allowed time for questions and further discussion on mobility concerns/needs.   Therapeutic Goal(s):  Identify and demonstrate safe technique for sit/stand transfers from various seating surfaces. Identify and demonstrate safe use of assistive devices with basic transfers and simple mobility. Identify and demonstrate ability to recognize signs/symptoms of orthostasis and appropriate compensatory/safety techniques.  Individual Participation: Pt was pleasant, assisted other patients during the session, and actively participated with the discussion and physical activity components of the session.  Pt was steady with standing therex/balance activities throughout.   Participation Level: Active and Engaged   Participation Quality: Minimal Cues   Behavior: Appropriate   Speech/Thought Process: Coherent, Directed, and Focused   Affect/Mood: Appropriate   Insight: Good   Judgement: Good   Individualization: Per above   Modes of Intervention: Activity, Discussion, and Education  Patient Response to Interventions:  Attentive, Engaged, Interested , and Receptive   Plan: Continue to engage patient in PT/OT groups 1 - 2x/week.  CHARM Hamilton Bertin PT, DPT 11/21/2024, 1:56 PM

## 2024-11-21 NOTE — Group Note (Signed)
 Date:  11/21/2024 Time:  2:11 PM  Group Topic/Focus:  Healthy Communication:   The focus of this group is to discuss communication, barriers to communication, as well as healthy ways to communicate with others. Making Healthy Choices:   The focus of this group is to help patients identify negative/unhealthy choices they were using prior to admission and identify positive/healthier coping strategies to replace them upon discharge. Managing Feelings:   The focus of this group is to identify what feelings patients have difficulty handling and develop a plan to handle them in a healthier way upon discharge. Wellness Toolbox:   The focus of this group is to discuss various aspects of wellness, balancing those aspects and exploring ways to increase the ability to experience wellness.  Patients will create a wellness toolbox for use upon discharge.  The group completed worksheets focused on expressing how each patient was feeling, followed by a positive Would You Rather activity. Patients also participated in a Complete the Sentence exercise, a simple crossword, and brain teasers to encourage interaction and engagement. The group discussed healthy ways to cope with daily stressors and practiced sharing coping strategies.  Participation Level:  Active  Participation Quality:  Appropriate  Affect:  Appropriate  Cognitive:  Appropriate  Insight: Appropriate  Engagement in Group:  Engaged  Modes of Intervention:  Activity and Discussion  Additional Comments:    Ronette Hank L Brenisha Tsui 11/21/2024, 2:11 PM

## 2024-11-21 NOTE — Progress Notes (Signed)
 [facility] MD Progress Note  11/21/2024 12:42 PM [name] Hinton  MRN:  [id number]  Patient is a 63 year old female with a history of Bipolar Disorder who presents voluntarily to [facility] for assessment. Patient presents via [facility] via [employer] with chief complaint of suicide attempt. [employer] was contacted by patient's mother after patient place a note on the bathroom door stating: Mom do not open this door!  Just call 911, and let them come in here.  You do not need to see me.  I am sorry!  This is what you need to tell the 911 operator: Tell them I told you not to come in.  I have slit my wrist and bled out.  I am dead (I hope!).    Patient endorses having suicidal thoughts for the past week and she confirms that today she locked herself in the bathroom with a knife and started to cut her wrist. Client also reported overdosing on 1400mg  of her heart medications a week ago.Patient is admitted to [facility] unit with Q15 min safety monitoring. Multidisciplinary team approach is offered. Medication management; group/milieu therapy is offered.  Subjective:  Chart reviewed, case discussed in multidisciplinary meeting, patient seen during rounds.   Patient is noted to be sitting in the day area.  She was tearful and crying today.  She reports that this is her first Christmas without her dad and feels reportedly very overwhelmed given her social situation of not being with family and becoming homeless due to her mental health problems.  Provider and patient discussed at length about her chronic history of self harming behaviors impairing her function in the community, making her separated from her family as her family feeling overwhelmed to keep her safe and also having hard time finding current placements due to her history.  Patient denies active SI/HI/plan.  She reports understanding that her father has worked very hard to keep her safe and she wants to reportedly continue to work on her safety and  engage in outpatient mental health services.  She denies auditory/visual hallucinations.  Provider and patient discussed at length about grieving process and the grieving counseling and spirituality that she is interested in to heal.  Patient denies auditory/visual hallucinations.  Past Psychiatric History: see h&P Family History:  Family History  Problem Relation Age of Onset   Breast cancer Neg Hx    Social History:  Social History   Substance and Sexual Activity  Alcohol  Use No     Social History   Substance and Sexual Activity  Drug Use No    Social History   Socioeconomic History   Marital status: Divorced    Spouse name: Not on file   Number of children: Not on file   Years of education: Not on file   Highest education level: Not on file  Occupational History   Not on file  Tobacco Use   Smoking status: Some Days    Types: Cigarettes   Smokeless tobacco: Never   Tobacco comments:    03/07/2024 patient smokes about 4-5 cigarettes some days    02/09/2023 A pack last patient about a week  Substance and Sexual Activity   Alcohol  use: No   Drug use: No   Sexual activity: Yes    Birth control/protection: Surgical  Other Topics Concern   Not on file  Social History Narrative   Not on file   Social Drivers of Health   Tobacco Use: High Risk (11/08/2024)  Patient History    Smoking Tobacco Use: Some Days    Smokeless Tobacco Use: Never    Passive Exposure: Not on file  Financial Resource Strain: Not on File (09/25/2022)   Received from [employer]    Financial Resource Strain: 0  Food Insecurity: No Food Insecurity (11/08/2024)   Epic    Worried About Programme Researcher, Broadcasting/film/video in the Last Year: Never true    Ran Out of Food in the Last Year: Never true  Transportation Needs: No Transportation Needs (11/08/2024)   Epic    Lack of Transportation (Medical): No    Lack of Transportation (Non-Medical): No  Physical Activity: Not on File  (03/20/2022)   Received from [facility]   Physical Activity    Physical Activity: 0  Stress: Not on File (03/20/2022)   Received from [facility]   Stress    Stress: 0  Social Connections: Not on File (08/10/2023)   Received from [facility]   Social Connections    Connectedness: 0  Depression (PHQ2-9): Not on file  Alcohol  Screen: Low Risk (11/07/2024)   Alcohol  Screen    Last Alcohol  Screening Score (AUDIT): 1  Housing: Low Risk (11/08/2024)   Epic    Unable to Pay for Housing in the Last Year: No    Number of Times Moved in the Last Year: 0    Homeless in the Last Year: No  Utilities: Not At Risk (11/08/2024)   Epic    Threatened with loss of utilities: No  Health Literacy: Not on file   Past Medical History:  Past Medical History:  Diagnosis Date   Anxiety    Bipolar affective (HCC)    Chronic headaches    Depression    Hypercholesteremia    Hypertension    Restless leg syndrome     Past Surgical History:  Procedure Laterality Date   ABDOMINAL HYSTERECTOMY     OOPHORECTOMY     TONSILLECTOMY      Current Medications: Current Facility-Administered Medications  Medication Dose Route Frequency Provider Last Rate Last Admin   acetaminophen  (TYLENOL ) tablet 650 mg  650 mg Oral Q6H PRN McLauchlin, Angela, NP   650 mg at 11/20/24 1226   alum & mag hydroxide-simeth (MAALOX/MYLANTA) 200-200-20 MG/5ML suspension 30 mL  30 mL Oral Q4H PRN McLauchlin, Angela, NP   30 mL at 11/13/24 1457   artificial tears ophthalmic solution 1 drop  1 drop Both Eyes TID PRN Jovani Colquhoun, MD       aspirin  EC tablet 81 mg  81 mg Oral QHS Molly Maselli, MD   81 mg at 11/20/24 2148   clonazePAM  (KLONOPIN ) disintegrating tablet 0.25 mg  0.25 mg Oral BID PRN Anjelica Gorniak, MD   0.25 mg at 11/17/24 1158   haloperidol  (HALDOL ) tablet 5 mg  5 mg Oral TID PRN McLauchlin, Angela, NP       And   diphenhydrAMINE  (BENADRYL ) capsule 50 mg  50 mg Oral TID PRN McLauchlin, Angela, NP       haloperidol  lactate (HALDOL )  injection 5 mg  5 mg Intramuscular TID PRN McLauchlin, Jon, NP       And   diphenhydrAMINE  (BENADRYL ) injection 50 mg  50 mg Intramuscular TID PRN McLauchlin, Angela, NP       And   LORazepam  (ATIVAN ) injection 2 mg  2 mg Intramuscular TID PRN McLauchlin, Angela, NP       haloperidol  lactate (HALDOL ) injection 10 mg  10 mg Intramuscular TID  PRN McLauchlin, Angela, NP       And   diphenhydrAMINE  (BENADRYL ) injection 50 mg  50 mg Intramuscular TID PRN McLauchlin, Angela, NP       And   LORazepam  (ATIVAN ) injection 2 mg  2 mg Intramuscular TID PRN McLauchlin, Angela, NP       gabapentin  (NEURONTIN ) capsule 300 mg  300 mg Oral TID Jaymien Landin, MD   300 mg at 11/21/24 1005   spironolactone  (ALDACTONE ) tablet 25 mg  25 mg Oral Daily Greenwood, Howard F, RPH   25 mg at 11/21/24 1005   And   hydrochlorothiazide  (HYDRODIURIL ) tablet 25 mg  25 mg Oral Daily Greenwood, Howard F, RPH   25 mg at 11/21/24 1005   hydrOXYzine  (ATARAX ) tablet 25 mg  25 mg Oral TID PRN McLauchlin, Angela, NP   25 mg at 11/20/24 1630   irbesartan  (AVAPRO ) tablet 37.5 mg  37.5 mg Oral Daily Farzana Koci, MD   37.5 mg at 11/21/24 1005   magnesium  hydroxide (MILK OF MAGNESIA) suspension 30 mL  30 mL Oral Daily PRN McLauchlin, Angela, NP       metoprolol  tartrate (LOPRESSOR ) tablet 25 mg  25 mg Oral BID PRN Antario Yasuda, MD   25 mg at 11/17/24 9380   multivitamin with minerals tablet 1 tablet  1 tablet Oral q morning Demarie Hyneman, MD   1 tablet at 11/21/24 1005   naproxen  (NAPROSYN ) tablet 250 mg  250 mg Oral BID WC Jamaurion Slemmer, MD   250 mg at 11/21/24 1005   nicotine  (NICODERM CQ  - dosed in mg/24 hours) patch 14 mg  14 mg Transdermal Daily Nicle Connole, MD   14 mg at 11/21/24 1006   pantoprazole  (PROTONIX ) EC tablet 40 mg  40 mg Oral Daily PRN Vianney Kopecky, MD       rOPINIRole  (REQUIP ) tablet 1 mg  1 mg Oral QHS Branae Crail, MD   1 mg at 11/20/24 2148   simvastatin  (ZOCOR ) tablet 20 mg  20 mg Oral QHS  Narissa Beaufort, MD   20 mg at 11/20/24 2148   traZODone  (DESYREL ) tablet 50 mg  50 mg Oral QHS PRN,MR X 1 Ajibola, Ene A, NP       venlafaxine  XR (EFFEXOR -XR) 24 hr capsule 150 mg  150 mg Oral Q breakfast Brenee Gajda, MD   150 mg at 11/21/24 1005    Lab Results:  No results found for this or any previous visit (from the past 48 hours).   Blood Alcohol  level:  Lab Results  Component Value Date   [facility] <15 11/07/2024    Metabolic Disorder Labs: No results found for: HGBA1C, MPG Lab Results  Component Value Date   PROLACTIN 8.7 11/07/2024   Lab Results  Component Value Date   CHOL 174 03/07/2024   TRIG 62 03/07/2024   HDL 62 03/07/2024   CHOLHDL 2.8 03/07/2024   VLDL 28 11/18/2007   LDLCALC 100 (H) 03/07/2024   LDLCALC (H) 11/18/2007    183        Total Cholesterol/HDL:CHD Risk Coronary Heart Disease Risk Table                     Men   Women  1/2 Average Risk   3.4   3.3    Physical Findings: AIMS:  , ,  ,  ,    CIWA:    COWS:      Psychiatric Specialty Exam:  Presentation  General Appearance:  Appropriate  for Environment; Casual  Eye Contact: Minimal  Speech: Normal Rate  Speech Volume: Decreased    Mood and Affect  Mood: Anxious; Depressed  Affect: Depressed; Flat   Thought Process  Thought Processes: Coherent  Orientation:Full (Time, Place and Person)  Thought Content:Logical  Hallucinations: Denies  Ideas of Reference:None  Suicidal Thoughts: Denies active SI  Homicidal Thoughts: Denies   Sensorium  Memory: Immediate Fair; Remote Fair  Judgment: Impaired  Insight: Shallow   Executive Functions  Concentration: Fair  Attention Span: Fair  Recall: [employer] of Knowledge: Fair  Language: Fair   Psychomotor Activity  Psychomotor Activity: No data recorded  Musculoskeletal: Strength & Muscle Tone: within normal limits Gait & Station: normal Assets  Assets: Manufacturing Systems Engineer; Desire for  Improvement    Physical Exam: Physical Exam Vitals and nursing note reviewed.    ROS Blood pressure 119/69, pulse 83, temperature (!) 97.5 F (36.4 C), resp. rate 17, height 5' 2 (1.575 m), weight 51.3 kg, SpO2 99%. Body mass index is 20.67 kg/m.  Diagnosis: Active Problems:   Bipolar I disorder, most recent episode depressed [facility])   Clinical Decision Making: Patient is currently admitted after a suicide attempt by cutting herself in the context of worsening depression and grieving after the loss of her father and boyfriend.  She will be monitored closely on the inpatient unit  Treatment Plan Summary:  Safety and Monitoring:             -- Voluntary admission to inpatient psychiatric unit for safety, stabilization and treatment             -- Daily contact with patient to assess and evaluate symptoms and progress in treatment             -- Patient's case to be discussed in multi-disciplinary team meeting             -- Observation Level: q15 minute checks             -- Vital signs:  q12 hours             -- Precautions: suicide, elopement, and assault   2. Psychiatric Diagnoses and Treatment:                Klonopin  0. 2 5 mg nightly and 0. 2 5 mg as needed daily  Effexor  XR increased to 150 mg daily Reduced her gabapentin  to 300 mg 3 times daily -- The risks/benefits/side-effects/alternatives to this medication were discussed in detail with the patient and time was given for questions. The patient consents to medication trial.                -- Metabolic profile and EKG monitoring obtained while on an atypical antipsychotic (BMI: Lipid Panel: HbgA1c: QTc:)              -- Encouraged patient to participate in unit milieu and in scheduled group therapies                            3. Medical Issues Being Addressed:  Dizziness-PT consult is done for further guidance 4. Discharge Planning:   -- Social work and case management to assist with discharge planning and  identification of hospital follow-up needs prior to discharge  -- Estimated LOS: 3-4 days  Allyn Foil, MD 11/21/2024, 12:42 PM

## 2024-11-21 NOTE — Group Note (Signed)
 Recreation Therapy Group Note   Group Topic:Healthy Support Systems  Group Date: 11/21/2024 Start Time: 1400 End Time: 1435 Facilitators: Celestia Jeoffrey BRAVO, LRT, CTRS Location: Dayroom  Group Description: Straw Bridge. In groups or individually, patients were given 10 plastic drinking straws and an equal length of masking tape. Using the materials provided, patients were instructed to build a free-standing bridge-like structure to suspend an everyday item (ex: deck of cards) off the floor or table surface. All materials were required to be used in secondary school teacher. LRT facilitated post-activity discussion reviewing the importance of having strong and healthy support systems in our lives. LRT discussed how the people in our lives serve as the tape and the deck of cards we placed on top of our straw structure are the stressors we face in daily life. LRT and pts discussed what happens in our life when things get too heavy for us , and we don't have strong supports outside of the hospital. Pt shared 2 of their healthy supports in their life aloud in the group.   Goal Area(s) Addressed:  Patient will identify 2 healthy supports in their life. Patient will identify skills to successfully complete activity. Patient will identify correlation of this activity to life post-discharge.  Patient will build on frustration tolerance skills. Patient will increase team building and communication skills.     Affect/Mood: Appropriate   Participation Level: Active and Engaged   Participation Quality: Independent   Behavior: Appropriate   Speech/Thought Process: Coherent   Insight: Good   Judgement: Good   Modes of Intervention: STEM Activity   Patient Response to Interventions:  Attentive, Engaged, Interested , and Receptive   Education Outcome:  Acknowledges education   Clinical Observations/Individualized Feedback: [name] Hinton was active in their participation of session activities and group discussion.  Pt identified my mom and friends as healthy supports.    Plan: Continue to engage patient in RT group sessions 2-3x/week.   Jeoffrey BRAVO Celestia, LRT, CTRS 11/21/2024 4:43 PM

## 2024-11-21 NOTE — Plan of Care (Signed)
" °  Problem: Activity: Goal: Sleeping patterns will improve Outcome: Progressing   Problem: Safety: Goal: Periods of time without injury will increase Outcome: Progressing   Problem: Coping: Goal: Coping ability will improve Outcome: Progressing   Problem: Self-Concept: Goal: Will verbalize positive feelings about self Outcome: Progressing   "

## 2024-11-21 NOTE — Plan of Care (Signed)
   Problem: Education: Goal: Ability to state activities that reduce stress will improve Outcome: Progressing   Problem: Coping: Goal: Ability to identify and develop effective coping behavior will improve Outcome: Progressing

## 2024-11-21 NOTE — Progress Notes (Signed)
 SI/HI/AVH: denies all  Behavior/Mood: cooperative/pleasant   Interaction/Group attendance: assertive/ 4 of 4 groups   Medication/PRNs: compliant/ tylenol  x2   Pain: 3/10 headache  Other: Patient is trying to get accepted to an [place] [facility]

## 2024-11-22 DIAGNOSIS — F319 Bipolar disorder, unspecified: Secondary | ICD-10-CM | POA: Diagnosis not present

## 2024-11-22 NOTE — Plan of Care (Signed)
   Problem: Education: Goal: Ability to state activities that reduce stress will improve Outcome: Progressing   Problem: Coping: Goal: Ability to identify and develop effective coping behavior will improve Outcome: Progressing

## 2024-11-22 NOTE — Group Note (Signed)
 Recreation Therapy Group Note   Group Topic:Leisure Education  Group Date: 11/22/2024 Start Time: 1400 End Time: 1450 Facilitators: Celestia Jeoffrey BRAVO, LRT, CTRS Location: Courtyard  Group Description: Music. Patients are encouraged to name their favorite song(s) for LRT to play song through speaker for group to hear, while in the courtyard getting fresh air and sunlight. Patients educated on the definition of leisure and the importance of having different leisure interests outside of the hospital. Group discussed how leisure activities can often be used as pharmacologist and that listening to music and being outside are examples.    Goal Area(s) Addressed:  Patient will identify a current leisure interest.  Patient will practice making a positive decision. Patient will have the opportunity to try a new leisure activity.   Affect/Mood: Appropriate   Participation Level: Active and Engaged   Participation Quality: Independent   Behavior: Appropriate, Calm, and Cooperative   Speech/Thought Process: Coherent   Insight: Good   Judgement: Good   Modes of Intervention: Guided Discussion and Music   Patient Response to Interventions:  Attentive, Engaged, Interested , and Receptive   Education Outcome:  Acknowledges education   Clinical Observations/Individualized Feedback: [name] Hinton was active in their participation of session activities and group discussion. Pt identified the black eyed peas as her favorite music.    Plan: Continue to engage patient in RT group sessions 2-3x/week.   Jeoffrey BRAVO Celestia, LRT, CTRS 11/22/2024 4:30 PM

## 2024-11-22 NOTE — Progress Notes (Addendum)
 SI/HI/AVH: denies all   Behavior/Mood: cooperative/pleasant    Interaction/Group attendance: assertive/ 4 of 4 groups   Medication/PRNs: compliant/ none   Pain: 1/10 headache  Other: pt placed pt on waitlist at [employer] through [employer]. Patient has declined acceptance to [facility] in [place].  Chaplain visit today.  Pt had an unwitnessed fall 12.16.

## 2024-11-22 NOTE — Group Note (Signed)
 LCSW Group Therapy Note   Group Date: 11/22/2024 Start Time: 1300 End Time: 1330   Type of Therapy and Topic:  Group Therapy: Challenging Core Beliefs  Participation Level:  Active  Description of Group:  Patients were educated about core beliefs and asked to identify one harmful core belief that they have. Patients were asked to explore from where those beliefs originate. Patients were asked to discuss how those beliefs make them feel and the resulting behaviors of those beliefs. They were then be asked if those beliefs are true and, if so, what evidence they have to support them. Lastly, group members were challenged to replace those negative core beliefs with helpful beliefs.   Therapeutic Goals:   1. Patient will identify harmful core beliefs and explore the origins of such beliefs. 2. Patient will identify feelings and behaviors that result from those core beliefs. 3. Patient will discuss whether such beliefs are true. 4.  Patient will replace harmful core beliefs with helpful ones.  Summary of Patient Progress:  [name] Hinton  actively engaged in processing and exploring how core beliefs are formed and how they impact thoughts, feelings, and behaviors. Patient proved open to input from peers and feedback from CSW. Patient demonstrated good insight into the subject matter, was respectful and supportive of peers, and participated throughout the entire session.  Therapeutic Modalities: Cognitive Behavioral Therapy; Solution-Focused Therapy   Chanson Teems D Abbigaile Rockman, [place] 11/22/2024  2:02 PM

## 2024-11-22 NOTE — Group Note (Signed)
 Date:  11/22/2024 Time:  4:27 PM  Group Topic/Focus:    Coloring offers geriatric patients significant benefits, including reducing stress and anxiety, improving fine motor skills (dexterity, hand-eye coordination), boosting cognitive function (focus, memory), providing creative self-expression, fighting boredom, and encouraging social interaction, all while fostering a sense of accomplishment and combating loneliness, especially for those with dementia or limited mobility.  Coloring induces a meditative state, channeling focus and interrupting negative thought patterns, which lowers anxiety and calms the mind.It can boost serotonin levels, leading to better moods, and help seniors forget aches and pains temporarily.  Participation Level:  Active  Participation Quality:  Appropriate  Affect:  Appropriate  Cognitive:  Appropriate  Insight: Appropriate  Engagement in Group:  Engaged  Modes of Intervention:  Activity  Additional Comments:  N/A  [name] Hinton [place] 11/22/2024, 4:27 PM

## 2024-11-22 NOTE — Progress Notes (Signed)
 [facility] MD Progress Note  11/22/2024 4:54 PM [name] Hinton  MRN:  [id number]  Patient is a 63 year old female with a history of Bipolar Disorder who presents voluntarily to [facility] for assessment. Patient presents via [facility] via [employer] with chief complaint of suicide attempt. [employer] was contacted by patient's mother after patient place a note on the bathroom door stating: Mom do not open this door!  Just call 911, and let them come in here.  You do not need to see me.  I am sorry!  This is what you need to tell the 911 operator: Tell them I told you not to come in.  I have slit my wrist and bled out.  I am dead (I hope!).    Patient endorses having suicidal thoughts for the past week and she confirms that today she locked herself in the bathroom with a knife and started to cut her wrist. Client also reported overdosing on 1400mg  of her heart medications a week ago.Patient is admitted to [facility] unit with Q15 min safety monitoring. Multidisciplinary team approach is offered. Medication management; group/milieu therapy is offered.  Subjective:  Chart reviewed, case discussed in multidisciplinary meeting, patient seen during rounds.   Patient is noted to be tearful today morning.  Patient reports being scared and anxious about being homeless on Christmas.  She adamantly is refusing to go to sober living facilities in [place] or any other location.  She reports that she likes to stay close to home in [place] during the holidays.  She acknowledged that her family is not willing to take care of her anymore.  Patient is tearful and is requesting the provider to help her through the holidays.  Later in the day provider was approached by staff members with concerns about patient's gestures during the group where she made 17 goodbye cards to the patient's and staff, requested nurse to call chaplain to do prayers.  She later on told the social worker that she is ready to be released to the streets  with no planning of how she is going to take care of herself with no support given her mental health problems.  Social work team continues to look for shelters and urban ministries in [place]  Past Psychiatric History: see h&P Family History:  Family History  Problem Relation Age of Onset   Breast cancer Neg Hx    Social History:  Social History   Substance and Sexual Activity  Alcohol  Use No     Social History   Substance and Sexual Activity  Drug Use No    Social History   Socioeconomic History   Marital status: Divorced    Spouse name: Not on file   Number of children: Not on file   Years of education: Not on file   Highest education level: Not on file  Occupational History   Not on file  Tobacco Use   Smoking status: Some Days    Types: Cigarettes   Smokeless tobacco: Never   Tobacco comments:    03/07/2024 patient smokes about 4-5 cigarettes some days    02/09/2023 A pack last patient about a week  Substance and Sexual Activity   Alcohol  use: No   Drug use: No   Sexual activity: Yes    Birth control/protection: Surgical  Other Topics Concern   Not on file  Social History Narrative   Not on file   Social Drivers of Health   Tobacco Use: High  Risk (11/08/2024)   Patient History    Smoking Tobacco Use: Some Days    Smokeless Tobacco Use: Never    Passive Exposure: Not on file  Financial Resource Strain: Not on File (09/25/2022)   Received from [facility]    Financial Resource Strain: 0  Food Insecurity: No Food Insecurity (11/08/2024)   Epic    Worried About Programme Researcher, Broadcasting/film/video in the Last Year: Never true    Ran Out of Food in the Last Year: Never true  Transportation Needs: No Transportation Needs (11/08/2024)   Epic    Lack of Transportation (Medical): No    Lack of Transportation (Non-Medical): No  Physical Activity: Not on File (03/20/2022)   Received from [facility]   Physical Activity    Physical Activity: 0  Stress:  Not on File (03/20/2022)   Received from [facility]   Stress    Stress: 0  Social Connections: Not on File (08/10/2023)   Received from [facility]   Social Connections    Connectedness: 0  Depression (PHQ2-9): Not on file  Alcohol  Screen: Low Risk (11/07/2024)   Alcohol  Screen    Last Alcohol  Screening Score (AUDIT): 1  Housing: Low Risk (11/08/2024)   Epic    Unable to Pay for Housing in the Last Year: No    Number of Times Moved in the Last Year: 0    Homeless in the Last Year: No  Utilities: Not At Risk (11/08/2024)   Epic    Threatened with loss of utilities: No  Health Literacy: Not on file   Past Medical History:  Past Medical History:  Diagnosis Date   Anxiety    Bipolar affective (HCC)    Chronic headaches    Depression    Hypercholesteremia    Hypertension    Restless leg syndrome     Past Surgical History:  Procedure Laterality Date   ABDOMINAL HYSTERECTOMY     OOPHORECTOMY     TONSILLECTOMY      Current Medications: Current Facility-Administered Medications  Medication Dose Route Frequency Provider Last Rate Last Admin   acetaminophen  (TYLENOL ) tablet 650 mg  650 mg Oral Q6H PRN McLauchlin, Angela, NP   650 mg at 11/21/24 2106   alum & mag hydroxide-simeth (MAALOX/MYLANTA) 200-200-20 MG/5ML suspension 30 mL  30 mL Oral Q4H PRN McLauchlin, Angela, NP   30 mL at 11/13/24 1457   artificial tears ophthalmic solution 1 drop  1 drop Both Eyes TID PRN Malikah Lakey, MD       aspirin  EC tablet 81 mg  81 mg Oral QHS Amneet Cendejas, MD   81 mg at 11/21/24 2106   clonazePAM  (KLONOPIN ) disintegrating tablet 0.25 mg  0.25 mg Oral BID Lisvet Rasheed, MD   0.25 mg at 11/22/24 9055   haloperidol  (HALDOL ) tablet 5 mg  5 mg Oral TID PRN McLauchlin, Angela, NP       And   diphenhydrAMINE  (BENADRYL ) capsule 50 mg  50 mg Oral TID PRN McLauchlin, Angela, NP       haloperidol  lactate (HALDOL ) injection 5 mg  5 mg Intramuscular TID PRN McLauchlin, Jon, NP       And   diphenhydrAMINE   (BENADRYL ) injection 50 mg  50 mg Intramuscular TID PRN McLauchlin, Angela, NP       And   LORazepam  (ATIVAN ) injection 2 mg  2 mg Intramuscular TID PRN McLauchlin, Angela, NP       haloperidol  lactate (HALDOL ) injection 10 mg  10  mg Intramuscular TID PRN McLauchlin, Angela, NP       And   diphenhydrAMINE  (BENADRYL ) injection 50 mg  50 mg Intramuscular TID PRN McLauchlin, Angela, NP       And   LORazepam  (ATIVAN ) injection 2 mg  2 mg Intramuscular TID PRN McLauchlin, Angela, NP       gabapentin  (NEURONTIN ) capsule 300 mg  300 mg Oral TID Darris Staiger, MD   300 mg at 11/22/24 1603   spironolactone  (ALDACTONE ) tablet 25 mg  25 mg Oral Daily Greenwood, Howard F, RPH   25 mg at 11/22/24 9055   And   hydrochlorothiazide  (HYDRODIURIL ) tablet 25 mg  25 mg Oral Daily Greenwood, Howard F, RPH   25 mg at 11/22/24 9055   hydrOXYzine  (ATARAX ) tablet 25 mg  25 mg Oral TID PRN McLauchlin, Angela, NP   25 mg at 11/21/24 2106   irbesartan  (AVAPRO ) tablet 37.5 mg  37.5 mg Oral Daily Bryana Froemming, MD   37.5 mg at 11/22/24 9055   magnesium  hydroxide (MILK OF MAGNESIA) suspension 30 mL  30 mL Oral Daily PRN McLauchlin, Angela, NP       metoprolol  tartrate (LOPRESSOR ) tablet 25 mg  25 mg Oral BID PRN Shafiq Larch, MD   25 mg at 11/17/24 9380   multivitamin with minerals tablet 1 tablet  1 tablet Oral q morning Keenen Roessner, MD   1 tablet at 11/22/24 9055   naproxen  (NAPROSYN ) tablet 250 mg  250 mg Oral BID WC Eliseo Withers, MD   250 mg at 11/22/24 1603   nicotine  (NICODERM CQ  - dosed in mg/24 hours) patch 14 mg  14 mg Transdermal Daily Brandan Robicheaux, MD   14 mg at 11/22/24 9056   pantoprazole  (PROTONIX ) EC tablet 40 mg  40 mg Oral Daily PRN Gladstone Rosas, MD       rOPINIRole  (REQUIP ) tablet 1 mg  1 mg Oral QHS Blanch Stang, MD   1 mg at 11/21/24 2107   simvastatin  (ZOCOR ) tablet 20 mg  20 mg Oral QHS Mayuri Staples, MD   20 mg at 11/21/24 2107   traZODone  (DESYREL ) tablet 50 mg  50 mg Oral QHS  PRN,MR X 1 Ajibola, Ene A, NP   50 mg at 11/21/24 2106   venlafaxine  XR (EFFEXOR -XR) 24 hr capsule 150 mg  150 mg Oral Q breakfast Zeferino Mounts, MD   150 mg at 11/22/24 9190    Lab Results:  No results found for this or any previous visit (from the past 48 hours).   Blood Alcohol  level:  Lab Results  Component Value Date   [facility] <15 11/07/2024    Metabolic Disorder Labs: No results found for: HGBA1C, MPG Lab Results  Component Value Date   PROLACTIN 8.7 11/07/2024   Lab Results  Component Value Date   CHOL 174 03/07/2024   TRIG 62 03/07/2024   HDL 62 03/07/2024   CHOLHDL 2.8 03/07/2024   VLDL 28 11/18/2007   LDLCALC 100 (H) 03/07/2024   LDLCALC (H) 11/18/2007    183        Total Cholesterol/HDL:CHD Risk Coronary Heart Disease Risk Table                     Men   Women  1/2 Average Risk   3.4   3.3    Physical Findings: AIMS:  , ,  ,  ,    CIWA:    COWS:      Psychiatric Specialty Exam:  Presentation  General Appearance:  Appropriate for Environment; Casual  Eye Contact: Minimal  Speech: Normal Rate  Speech Volume: Decreased    Mood and Affect  Mood: Anxious; Depressed  Affect: Depressed; Flat   Thought Process  Thought Processes: Coherent  Orientation:Full (Time, Place and Person)  Thought Content:Logical  Hallucinations: Denies  Ideas of Reference:None  Suicidal Thoughts: Denies active SI  Homicidal Thoughts: Denies   Sensorium  Memory: Immediate Fair; Remote Fair  Judgment: Impaired  Insight: Shallow   Executive Functions  Concentration: Fair  Attention Span: Fair  Recall: [employer] of Knowledge: Fair  Language: Fair   Psychomotor Activity  Psychomotor Activity: No data recorded  Musculoskeletal: Strength & Muscle Tone: within normal limits Gait & Station: normal Assets  Assets: Manufacturing Systems Engineer; Desire for Improvement    Physical Exam: Physical Exam Vitals and nursing note  reviewed.    ROS Blood pressure 112/71, pulse 92, temperature 97.7 F (36.5 C), resp. rate 17, height 5' 2 (1.575 m), weight 51.3 kg, SpO2 100%. Body mass index is 20.67 kg/m.  Diagnosis: Active Problems:   Bipolar I disorder, most recent episode depressed [facility])   Clinical Decision Making: Patient is currently admitted after a suicide attempt by cutting herself in the context of worsening depression and grieving after the loss of her father and boyfriend.  She will be monitored closely on the inpatient unit  Treatment Plan Summary:  Safety and Monitoring:             -- Voluntary admission to inpatient psychiatric unit for safety, stabilization and treatment             -- Daily contact with patient to assess and evaluate symptoms and progress in treatment             -- Patient's case to be discussed in multi-disciplinary team meeting             -- Observation Level: q15 minute checks             -- Vital signs:  q12 hours             -- Precautions: suicide, elopement, and assault   2. Psychiatric Diagnoses and Treatment:                Klonopin  0. 2 5 mg nightly and 0. 2 5 mg as needed daily  Effexor  XR increased to 150 mg daily Reduced her gabapentin  to 300 mg 3 times daily -- The risks/benefits/side-effects/alternatives to this medication were discussed in detail with the patient and time was given for questions. The patient consents to medication trial.                -- Metabolic profile and EKG monitoring obtained while on an atypical antipsychotic (BMI: Lipid Panel: HbgA1c: QTc:)              -- Encouraged patient to participate in unit milieu and in scheduled group therapies                            3. Medical Issues Being Addressed:  Dizziness-PT consult is done for further guidance 4. Discharge Planning:   -- Social work and case management to assist with discharge planning and identification of hospital follow-up needs prior to discharge  -- Estimated LOS: 3-4  days  Allyn Foil, MD 11/22/2024, 4:54 PM

## 2024-11-22 NOTE — Group Note (Signed)
 Date:  11/22/2024 Time:  8:36 PM  Group Topic/Focus:  Wrap-Up Group:   The focus of this group is to help patients review their daily goal of treatment and discuss progress on daily workbooks.    Participation Level:  Active  Participation Quality:  Appropriate  Affect:  Appropriate  Cognitive:  Appropriate  Insight: Appropriate  Engagement in Group:  Engaged  Modes of Intervention:  Discussion  Additional Comments:    [name] 11/22/2024, 8:36 PM

## 2024-11-22 NOTE — Plan of Care (Signed)
  Problem: Self-Concept: Goal: Level of anxiety will decrease Outcome: Progressing   Problem: Activity: Goal: Interest or engagement in activities will improve Outcome: Progressing Goal: Sleeping patterns will improve Outcome: Progressing

## 2024-11-22 NOTE — Progress Notes (Signed)
" °   11/21/24 2258  Psych Admission Type (Psych Patients Only)  Admission Status Voluntary  Psychosocial Assessment  Patient Complaints Anxiety  Eye Contact Fair  Facial Expression Animated  Affect Appropriate to circumstance  Speech Logical/coherent  Interaction Assertive  Motor Activity Unsteady  Appearance/Hygiene Unremarkable  Behavior Characteristics Cooperative  Mood Pleasant  Thought Process  Coherency WDL  Content WDL  Delusions None reported or observed  Perception WDL  Hallucination None reported or observed  Judgment WDL  Confusion None  Danger to Self  Current suicidal ideation? Denies  Danger to Others  Danger to Others None reported or observed    Estimated Sleeping Duration (Last 24 Hours): 4.50-7.00 hours  "

## 2024-11-22 NOTE — Group Note (Signed)
 Date:  11/22/2024 Time:  2:49 PM  Group Topic/Focus:  Early Warning Signs:   The focus of this group is to help patients identify signs or symptoms they exhibit before slipping into an unhealthy state or crisis.    Participation Level:  Active  Participation Quality:  Appropriate  Affect:  Appropriate  Cognitive:  Appropriate  Insight: Appropriate  Engagement in Group:  Engaged  Modes of Intervention:  Discussion   [name] 11/22/2024, 2:49 PM

## 2024-11-22 NOTE — Progress Notes (Signed)
" °   11/22/24 1515  Spiritual Encounters  Type of Visit Initial  Care provided to: Patient  Conversation partners present during encounter Social worker/Care management/TOC  Referral source Other (comment) ([facility])  Reason for visit Routine spiritual support  OnCall Visit Yes   Chaplain visited patient per a [facility] in the system.  Patient shared that she doesn't want to fall into depression regarding her grief about her father's death earlier this year.  She didn't get a chance to grieve because she was caring for her mother and wanted to be strong for her.  Chaplain suggested there may be safety in a grief support group to manage the grief and cope in healthy ways.  Patient was receptive to receiving a number that the Chaplain provided for her to contact.  Patient would like her mother to go also.   Rev. Rana M. Nicholaus, M.Div. Chaplain Resident [facility] "

## 2024-11-22 NOTE — B[facility] Counselor (Signed)
 CSW contacted [employer] and placed pt on waitlist at [facility].   Lum Croft, MSW, [place] 11/22/2024 2:08 PM

## 2024-11-23 DIAGNOSIS — F319 Bipolar disorder, unspecified: Secondary | ICD-10-CM | POA: Diagnosis not present

## 2024-11-23 NOTE — Progress Notes (Signed)
" °   11/23/24 1200  Psych Admission Type (Psych Patients Only)  Admission Status Voluntary  Psychosocial Assessment  Patient Complaints Anxiety  Eye Contact Fair  Facial Expression Animated  Affect Appropriate to circumstance  Speech Logical/coherent  Interaction Assertive  Motor Activity Slow  Appearance/Hygiene Unremarkable  Behavior Characteristics Cooperative  Mood Pleasant  Thought Process  Coherency WDL  Content WDL  Delusions None reported or observed  Perception WDL  Hallucination None reported or observed  Judgment WDL  Confusion None  Danger to Self  Current suicidal ideation? Denies  Danger to Others  Danger to Others None reported or observed    "

## 2024-11-23 NOTE — Group Note (Signed)
 Recreation Therapy Group Note   Group Topic:Communication  Group Date: 11/23/2024 Start Time: 1250 End Time: 1325 Facilitators: Celestia Jeoffrey BRAVO, LRT, CTRS Location: Dayroom  Group Description: LRT and NT, Leita, passed out large Christmas bags filled with 4-6 individually wrapped presents inside for the patient to unwrap. Christmas music was being played in the background. LRT, NT, and patients discussed past Christmas traditions and fun memories they have made while communicating with one another in the dayroom.   Goal Area(s) addressed: Patient will increase communication.  Patient will reminisce on past memory Patient will practice gratitude.    Affect/Mood: Appropriate   Participation Level: Active and Engaged   Participation Quality: Independent   Behavior: Calm and Cooperative   Speech/Thought Process: Coherent   Insight: Fair   Judgement: Fair    Modes of Intervention: Activity, Music, and Open Conversation   Patient Response to Interventions:  Attentive, Engaged, Interested , and Receptive   Education Outcome:  Acknowledges education   Clinical Observations/Individualized Feedback: Davan was active in their participation of session activities and group discussion. Pt interacted well with LRT and peers duration of session.    Plan: Continue to engage patient in RT group sessions 2-3x/week.   Jeoffrey BRAVO Celestia, LRT, CTRS 11/23/2024 1:41 PM

## 2024-11-23 NOTE — Group Note (Signed)
 Date:  11/23/2024 Time:  1:59 PM  Group Topic/Focus:  Self Care:   The focus of this group is to help patients understand the importance of self-care in order to improve or restore emotional, physical, spiritual, interpersonal, and financial health.    Participation Level:  Active  Participation Quality:  Appropriate  Affect:  Appropriate  Cognitive:  Alert and Appropriate  Insight: Appropriate  Engagement in Group:  Engaged  Modes of Intervention:  Activity, Orientation, and Socialization  Additional Comments:  Christmas party    Maglione,Concepcion Gillott E 11/23/2024, 1:59 PM

## 2024-11-23 NOTE — Group Note (Signed)
 Date:  11/23/2024 Time:  3:26 PM  Group Topic/Focus:  Goals Group:   The focus of this group is to help patients establish daily goals to achieve during treatment and discuss how the patient can incorporate goal setting into their daily lives to aide in recovery. Managing Feelings:   The focus of this group is to identify what feelings patients have difficulty handling and develop a plan to handle them in a healthier way upon discharge.    Participation Level:  Active  Participation Quality:  Appropriate and Attentive  Affect:  Appropriate  Cognitive:  Alert and Appropriate  Insight: Appropriate  Engagement in Group:  Engaged  Modes of Intervention:  Activity  Additional Comments:   COURTYARD AND HOT COCO   Maglione,Alazae Crymes E 11/23/2024, 3:26 PM

## 2024-11-23 NOTE — Progress Notes (Signed)
 [facility] MD Progress Note  11/23/2024 11:56 AM [name] Hinton  MRN:  [id number]  Patient is a 63 year old female with a history of Bipolar Disorder who presents voluntarily to [facility] for assessment. Patient presents via [facility] via [employer] with chief complaint of suicide attempt. [employer] was contacted by patient's mother after patient place a note on the bathroom door stating: Mom do not open this door!  Just call 911, and let them come in here.  You do not need to see me.  I am sorry!  This is what you need to tell the 911 operator: Tell them I told you not to come in.  I have slit my wrist and bled out.  I am dead (I hope!).    Patient endorses having suicidal thoughts for the past week and she confirms that today she locked herself in the bathroom with a knife and started to cut her wrist. Client also reported overdosing on 1400mg  of her heart medications a week ago.Patient is admitted to [facility] unit with Q15 min safety monitoring. Multidisciplinary team approach is offered. Medication management; group/milieu therapy is offered.  Subjective:  Chart reviewed, case discussed in multidisciplinary meeting, patient seen during rounds.    Patient is noted to be tearful in her room today.  Patient is aware that tomorrow's Christmas and was uncertain about her discharge planning.  Provider discussed the safety concerns that the team has regarding patient's discharge to the streets being homeless especially around her birthday and Christmas time, as patient informed the team her father being dead and this is the first Christmas without him.  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  She reports that she can survive and the shelters.  She reports that she feels guilty for scaring her mom regarding her safety by acting on her suicidal thoughts.  She reports her younger sister is in [place] but is busy with 2 jobs  and her older sister is in [place]  and visits her mom every few months.  Patient reports that during the daytime she will walk around and in the nights she will go to shelters.  She reports her mom told her that she will drive her around for her appointments.  Patient also talks about having a day program in [place].  She wants to attend that.  She consistently denies SI/HI/plan and reports that she will never hurt herself and put her mother through the trauma again.  Patient reports that she is determined to survive and get the help for her mental health as outpatient at this time.  Patient is agreeable to continue to work with her family and to see the availability of the shelters during holidays. Past Psychiatric History: see h&P Family History:  Family History  Problem Relation Age of Onset   Breast cancer Neg Hx    Social History:  Social History   Substance and Sexual Activity  Alcohol  Use No     Social History   Substance and Sexual Activity  Drug Use No    Social History   Socioeconomic History   Marital status: Divorced    Spouse name: Not on file   Number of children: Not on file   Years of education: Not on file   Highest education level: Not on file  Occupational History   Not on file  Tobacco Use   Smoking status: Some  Days    Types: Cigarettes   Smokeless tobacco: Never   Tobacco comments:    03/07/2024 patient smokes about 4-5 cigarettes some days    02/09/2023 A pack last patient about a week  Substance and Sexual Activity   Alcohol  use: No   Drug use: No   Sexual activity: Yes    Birth control/protection: Surgical  Other Topics Concern   Not on file  Social History Narrative   Not on file   Social Drivers of Health   Tobacco Use: High Risk (11/08/2024)   Patient History    Smoking Tobacco Use: Some Days    Smokeless Tobacco Use: Never    Passive Exposure: Not on file  Financial Resource Strain: Not on File (09/25/2022)   Received from  [employer]    Financial Resource Strain: 0  Food Insecurity: No Food Insecurity (11/08/2024)   Epic    Worried About Programme Researcher, Broadcasting/film/video in the Last Year: Never true    Ran Out of Food in the Last Year: Never true  Transportation Needs: No Transportation Needs (11/08/2024)   Epic    Lack of Transportation (Medical): No    Lack of Transportation (Non-Medical): No  Physical Activity: Not on File (03/20/2022)   Received from [facility]   Physical Activity    Physical Activity: 0  Stress: Not on File (03/20/2022)   Received from [facility]   Stress    Stress: 0  Social Connections: Not on File (08/10/2023)   [facility] from [facility]   Social Connections    Connectedness: 0  Depression (PHQ2-9): Not on file  Alcohol  Screen: Low Risk (11/07/2024)   Alcohol  Screen    Last Alcohol  Screening Score (AUDIT): 1  Housing: Low Risk (11/08/2024)   Epic    Unable to Pay for Housing in the Last Year: No    Number of Times Moved in the Last Year: 0    Homeless in the Last Year: No  Utilities: Not At Risk (11/08/2024)   Epic    Threatened with loss of utilities: No  Health Literacy: Not on file   Past Medical History:  Past Medical History:  Diagnosis Date   Anxiety    Bipolar affective (HCC)    Chronic headaches    Depression    Hypercholesteremia    Hypertension    Restless leg syndrome     Past Surgical History:  Procedure Laterality Date   ABDOMINAL HYSTERECTOMY     OOPHORECTOMY     TONSILLECTOMY      Current Medications: Current Facility-Administered Medications  Medication Dose Route Frequency Provider Last Rate Last Admin   acetaminophen  (TYLENOL ) tablet 650 mg  650 mg Oral Q6H PRN McLauchlin, Angela, NP   650 mg at 11/22/24 2023   alum & mag hydroxide-simeth (MAALOX/MYLANTA) 200-200-20 MG/5ML suspension 30 mL  30 mL Oral Q4H PRN McLauchlin, Angela, NP   30 mL at 11/13/24 1457   artificial tears ophthalmic solution 1 drop  1 drop Both Eyes TID PRN Chelsea Nusz, MD        aspirin  EC tablet 81 mg  81 mg Oral QHS Macallister Ashmead, MD   81 mg at 11/22/24 2101   clonazePAM  (KLONOPIN ) disintegrating tablet 0.25 mg  0.25 mg Oral BID June Rode, MD   0.25 mg at 11/23/24 0920   haloperidol  (HALDOL ) tablet 5 mg  5 mg Oral TID PRN McLauchlin, Jon, NP       And   diphenhydrAMINE  (BENADRYL ) capsule  50 mg  50 mg Oral TID PRN McLauchlin, Angela, NP       haloperidol  lactate (HALDOL ) injection 5 mg  5 mg Intramuscular TID PRN McLauchlin, Jon, NP       And   diphenhydrAMINE  (BENADRYL ) injection 50 mg  50 mg Intramuscular TID PRN McLauchlin, Jon, NP       And   LORazepam  (ATIVAN ) injection 2 mg  2 mg Intramuscular TID PRN McLauchlin, Angela, NP       haloperidol  lactate (HALDOL ) injection 10 mg  10 mg Intramuscular TID PRN McLauchlin, Jon, NP       And   diphenhydrAMINE  (BENADRYL ) injection 50 mg  50 mg Intramuscular TID PRN McLauchlin, Jon, NP       And   LORazepam  (ATIVAN ) injection 2 mg  2 mg Intramuscular TID PRN McLauchlin, Angela, NP       gabapentin  (NEURONTIN ) capsule 300 mg  300 mg Oral TID Tawan Corkern, MD   300 mg at 11/23/24 0920   spironolactone  (ALDACTONE ) tablet 25 mg  25 mg Oral Daily Greenwood, Howard F, RPH   25 mg at 11/23/24 9078   And   hydrochlorothiazide  (HYDRODIURIL ) tablet 25 mg  25 mg Oral Daily Niels Kayla FALCON, RPH   25 mg at 11/23/24 9080   hydrOXYzine  (ATARAX ) tablet 25 mg  25 mg Oral TID PRN McLauchlin, Angela, NP   25 mg at 11/22/24 2101   irbesartan  (AVAPRO ) tablet 37.5 mg  37.5 mg Oral Daily Milany Geck, MD   37.5 mg at 11/23/24 9077   magnesium  hydroxide (MILK OF MAGNESIA) suspension 30 mL  30 mL Oral Daily PRN McLauchlin, Angela, NP       metoprolol  tartrate (LOPRESSOR ) tablet 25 mg  25 mg Oral BID PRN Estaban Mainville, MD   25 mg at 11/17/24 9380   multivitamin with minerals tablet 1 tablet  1 tablet Oral q morning Altin Sease, MD   1 tablet at 11/23/24 9080   naproxen  (NAPROSYN ) tablet 250 mg  250 mg  Oral BID WC Tayllor Breitenstein, MD   250 mg at 11/23/24 9078   nicotine  (NICODERM CQ  - dosed in mg/24 hours) patch 14 mg  14 mg Transdermal Daily Cully Luckow, MD   14 mg at 11/23/24 9070   pantoprazole  (PROTONIX ) EC tablet 40 mg  40 mg Oral Daily PRN Ileah Falkenstein, MD       rOPINIRole  (REQUIP ) tablet 1 mg  1 mg Oral QHS Garyn Arlotta, MD   1 mg at 11/22/24 2101   simvastatin  (ZOCOR ) tablet 20 mg  20 mg Oral QHS Jodelle Fausto, MD   20 mg at 11/22/24 2101   traZODone  (DESYREL ) tablet 50 mg  50 mg Oral QHS PRN,MR X 1 Ajibola, Ene A, NP   50 mg at 11/22/24 2101   venlafaxine  XR (EFFEXOR -XR) 24 hr capsule 150 mg  150 mg Oral Q breakfast Makhia Vosler, MD   150 mg at 11/23/24 9079    Lab Results:  No results found for this or any previous visit (from the past 48 hours).   Blood Alcohol  level:  Lab Results  Component Value Date   [facility] <15 11/07/2024    Metabolic Disorder Labs: No results found for: HGBA1C, MPG Lab Results  Component Value Date   PROLACTIN 8.7 11/07/2024   Lab Results  Component Value Date   CHOL 174 03/07/2024   TRIG 62 03/07/2024   HDL 62 03/07/2024   CHOLHDL 2.8 03/07/2024   VLDL 28 11/18/2007  LDLCALC 100 (H) 03/07/2024   LDLCALC (H) 11/18/2007    183        Total Cholesterol/HDL:CHD Risk Coronary Heart Disease Risk Table                     Men   Women  1/2 Average Risk   3.4   3.3    Physical Findings: AIMS:  , ,  ,  ,    CIWA:    COWS:      Psychiatric Specialty Exam:  Presentation  General Appearance:  Appropriate for Environment; Casual  Eye Contact: Minimal  Speech: Normal Rate  Speech Volume: Decreased    Mood and Affect  Mood: Anxious Affect: Depressed; Flat   Thought Process  Thought Processes: Coherent  Orientation:Full (Time, Place and Person)  Thought Content:Logical  Hallucinations: Denies  Ideas of Reference:None  Suicidal Thoughts: Denies active SI  Homicidal Thoughts: Denies   Sensorium   Memory: Immediate Fair; Remote Fair  Judgment: Impaired  Insight: Shallow   Executive Functions  Concentration: Fair  Attention Span: Fair  Recall: [employer] of Knowledge: Fair  Language: Fair   Psychomotor Activity  Psychomotor Activity: No data recorded  Musculoskeletal: Strength & Muscle Tone: within normal limits Gait & Station: normal Assets  Assets: Manufacturing Systems Engineer; Desire for Improvement    Physical Exam: Physical Exam Vitals and nursing note reviewed.    ROS Blood pressure 100/85, pulse 88, temperature 99.4 F (37.4 C), temperature source Oral, resp. rate 16, height 5' 2 (1.575 m), weight 51.3 kg, SpO2 100%. Body mass index is 20.67 kg/m.  Diagnosis: Active Problems:   Bipolar I disorder, most recent episode depressed [facility])   Clinical Decision Making: Patient is currently admitted after a suicide attempt by cutting herself in the context of worsening depression and grieving after the loss of her father and boyfriend.  She will be monitored closely on the inpatient unit  Treatment Plan Summary:  Safety and Monitoring:             -- Voluntary admission to inpatient psychiatric unit for safety, stabilization and treatment             -- Daily contact with patient to assess and evaluate symptoms and progress in treatment             -- Patient's case to be discussed in multi-disciplinary team meeting             -- Observation Level: q15 minute checks             -- Vital signs:  q12 hours             -- Precautions: suicide, elopement, and assault   2. Psychiatric Diagnoses and Treatment:                Klonopin  0. 2 5 mg nightly and 0. 2 5 mg as needed daily  Effexor  XR increased to 150 mg daily Reduced her gabapentin  to 300 mg 3 times daily -- The risks/benefits/side-effects/alternatives to this medication were discussed in detail with the patient and time was given for questions. The patient consents to medication trial.                 -- Metabolic profile and EKG monitoring obtained while on an atypical antipsychotic (BMI: Lipid Panel: HbgA1c: QTc:)              -- Encouraged patient to participate in unit milieu and in  scheduled group therapies                            3. Medical Issues Being Addressed:  Dizziness-PT consult is done for further guidance 4. Discharge Planning:   -- Social work and case management to assist with discharge planning and identification of hospital follow-up needs prior to discharge  -- Estimated LOS: 3-4 days  Allyn Foil, MD 11/23/2024, 11:56 AM

## 2024-11-23 NOTE — Group Note (Signed)
 Date:  11/23/2024 Time:  9:26 PM  Group Topic/Focus:  Wrap-Up Group:   The focus of this group is to help patients review their daily goal of treatment and discuss progress on daily workbooks.    Participation Level:  Active  Participation Quality:  Appropriate  Affect:  Appropriate  Cognitive:  Alert  Insight: Appropriate  Engagement in Group:  Engaged  Modes of Intervention:  Discussion  Additional Comments:    [name] Hinton CHRISTELLA Bunker 11/23/2024, 9:26 PM

## 2024-11-23 NOTE — Plan of Care (Signed)
" °  Problem: Self-Concept: Goal: Level of anxiety will decrease Outcome: Progressing   Problem: Activity: Goal: Sleeping patterns will improve Outcome: Progressing   "

## 2024-11-23 NOTE — Progress Notes (Signed)
" °   11/23/24 1015  Spiritual Encounters  Type of Visit Follow up  Care provided to: Patient  Referral source Chaplain assessment  Reason for visit Routine spiritual support  OnCall Visit No   While rounding on unit I engaged [employer] who remembered me from last week's group. She shared the drawing I made of a cardinal and reflected with me on the meaning that she still drew from that (and how it served as point of connection to her father who recently passed).  I provided compassoinate presence and relational support. At her request I wrote a message on the drawing ans wished her much healing, my gratitude for her sharing, and Gertha Christmas.  Aydian Dimmick L. Delores HERO.Div "

## 2024-11-24 DIAGNOSIS — F319 Bipolar disorder, unspecified: Secondary | ICD-10-CM | POA: Diagnosis not present

## 2024-11-24 NOTE — Plan of Care (Signed)
" °  Problem: Education: Goal: Ability to state activities that reduce stress will improve Outcome: Progressing   Problem: Coping: Goal: Ability to identify and develop effective coping behavior will improve Outcome: Progressing   Problem: Self-Concept: Goal: Ability to modify response to factors that promote anxiety will improve Outcome: Progressing   Problem: Self-Concept: Goal: Ability to identify factors that promote anxiety will improve Outcome: Not Progressing Goal: Level of anxiety will decrease Outcome: Not Progressing   "

## 2024-11-24 NOTE — Plan of Care (Signed)
   Problem: Education: Goal: Ability to state activities that reduce stress will improve Outcome: Progressing   Problem: Coping: Goal: Ability to identify and develop effective coping behavior will improve Outcome: Progressing   Problem: Self-Concept: Goal: Ability to identify factors that promote anxiety will improve Outcome: Progressing Goal: Level of anxiety will decrease Outcome: Progressing

## 2024-11-24 NOTE — Group Note (Signed)
 [facility] LCSW Group Therapy Note   Group Date: 11/24/2024 Start Time: 1300 End Time: 1400  Type of Therapy/Topic:  Group Therapy:  Feelings about Diagnosis  Participation Level:  Active   Mood: Appropriate    Description of Group:    This group will allow patients to explore their thoughts and feelings about diagnoses they have received. Patients will be guided to explore their level of understanding and acceptance of these diagnoses. Facilitator will encourage patients to process their thoughts and feelings about the reactions of others to their diagnosis, and will guide patients in identifying ways to discuss their diagnosis with significant others in their lives. This group will be process-oriented, with patients participating in exploration of their own experiences as well as giving and receiving support and challenge from other group members.   Therapeutic Goals: 1. Patient will demonstrate understanding of diagnosis as evidence by identifying two or more symptoms of the disorder:  2. Patient will be able to express two feelings regarding the diagnosis 3. Patient will demonstrate ability to communicate their needs through discussion and/or role plays  Summary of Patient Progress: Participants reflected on their emotions related to the holidays, using the occasion as a chance to engage in a life review.  Patients played and sang their most cherished nostalgic songs to evoke a sense of comfort and happiness.  Patients created a supportive environment where everyone felt safe to open up, share, and explore their emotions.    Therapeutic Modalities:   Cognitive Behavioral Therapy Brief Therapy Feelings Identification    [name] CHRISTELLA Kerns, LCSW

## 2024-11-24 NOTE — Group Note (Signed)
 Date:  11/24/2024 Time:  8:32 PM  Group Topic/Focus:  Goals Group:   The focus of this group is to help patients establish daily goals to achieve during treatment and discuss how the patient can incorporate goal setting into their daily lives to aide in recovery. Personal Choices and Values:   The focus of this group is to help patients assess and explore the importance of values in their lives, how their values affect their decisions, how they express their values and what opposes their expression. Spirituality:   The focus of this group is to discuss how one's spirituality can aide in recovery.    Participation Level:  Active  Participation Quality:  Appropriate  Affect:  Appropriate  Cognitive:  Appropriate  Insight: Appropriate  Engagement in Group:  Improving  Modes of Intervention:  Discussion  Additional Comments:  Pt attended group and participated in group discussion  Sonja Manseau E Shelisa Fern 11/24/2024, 8:32 PM

## 2024-11-24 NOTE — Group Note (Signed)
 Date:  11/24/2024 Time:  3:43 PM  Group Topic/Focus:  Self Care:   The focus of this group is to help patients understand the importance of self-care in order to improve or restore emotional, physical, spiritual, interpersonal, and financial health.  Meditation offers significant benefits for geriatric patients, including boosting cognitive health (memory, focus), improving emotional well-being (reducing stress, anxiety, depression, loneliness), managing chronic pain, lowering blood pressure, strengthening the immune system, and enhancing physical balance and flexibility through gentle movements like Tai Chi, all while supporting brain plasticity and overall quality of life as they age.   Participation Level:  Active  Participation Quality:  Appropriate  Affect:  Appropriate  Cognitive:  Appropriate  Insight: Appropriate  Engagement in Group:  Engaged  Modes of Intervention:  Activity  Additional Comments:  N/A  [name] Hinton Gavel 11/24/2024, 3:43 PM

## 2024-11-24 NOTE — Progress Notes (Signed)
 [facility] MD Progress Note  11/24/2024 12:36 PM [name] Hinton  MRN:  [id number]  Patient is a 63 year old female with a history of Bipolar Disorder who presents voluntarily to [facility] for assessment. Patient presents via [facility] via [employer] with chief complaint of suicide attempt. [employer] was contacted by patient's mother after patient place a note on the bathroom door stating: Mom do not open this door!  Just call 911, and let them come in here.  You do not need to see me.  I am sorry!  This is what you need to tell the 911 operator: Tell them I told you not to come in.  I have slit my wrist and bled out.  I am dead (I hope!).    Patient endorses having suicidal thoughts for the past week and she confirms that today she locked herself in the bathroom with a knife and started to cut her wrist. Client also reported overdosing on 1400mg  of her heart medications a week ago.Patient is admitted to [facility] unit with Q15 min safety monitoring. Multidisciplinary team approach is offered. Medication management; group/milieu therapy is offered.  Subjective:  Chart reviewed, case discussed in multidisciplinary meeting, patient seen during rounds.   Patient is noted to be in the day area.  She offered a thank you letter to the provider..  Patient reports feeling prepared to be homeless and living in shelters.  She continues to talk about her mom helping her and going for her appointments from the shelter.  Patient and provider discussed availability of shelter beds during holidays and possibly looking into the shelter beds on Monday.  Patient continues to be tearful thanking the provider for helping her with her homeless situation.  Patient denies SI/HI/plan and denies hallucinations.  Past Psychiatric History: see h&P Family History:  Family History  Problem Relation Age of Onset   Breast cancer Neg Hx    Social History:  Social History   Substance and Sexual Activity  Alcohol  Use No      Social History   Substance and Sexual Activity  Drug Use No    Social History   Socioeconomic History   Marital status: Divorced    Spouse name: Not on file   Number of children: Not on file   Years of education: Not on file   Highest education level: Not on file  Occupational History   Not on file  Tobacco Use   Smoking status: Some Days    Types: Cigarettes   Smokeless tobacco: Never   Tobacco comments:    03/07/2024 patient smokes about 4-5 cigarettes some days    02/09/2023 A pack last patient about a week  Substance and Sexual Activity   Alcohol  use: No   Drug use: No   Sexual activity: Yes    Birth control/protection: Surgical  Other Topics Concern   Not on file  Social History Narrative   Not on file   Social Drivers of Health   Tobacco Use: High Risk (11/08/2024)   Patient History    Smoking Tobacco Use: Some Days    Smokeless Tobacco Use: Never    Passive Exposure: Not on file  Financial Resource Strain: Not on File (09/25/2022)   Received from [employer]    Financial Resource Strain: 0  Food Insecurity: No Food Insecurity (11/08/2024)   Epic    Worried About Radiation Protection Practitioner of Food in the Last Year: Never true    Ran  Out of Food in the Last Year: Never true  Transportation Needs: No Transportation Needs (11/08/2024)   Epic    Lack of Transportation (Medical): No    Lack of Transportation (Non-Medical): No  Physical Activity: Not on File (03/20/2022)   Received from [facility]   Physical Activity    Physical Activity: 0  Stress: Not on File (03/20/2022)   Received from [facility]   Stress    Stress: 0  Social Connections: Not on File (08/10/2023)   Received from [facility]   Social Connections    Connectedness: 0  Depression (PHQ2-9): Not on file  Alcohol  Screen: Low Risk (11/07/2024)   Alcohol  Screen    Last Alcohol  Screening Score (AUDIT): 1  Housing: Low Risk (11/08/2024)   Epic    Unable to Pay for Housing in the Last Year: No    Number  of Times Moved in the Last Year: 0    Homeless in the Last Year: No  Utilities: Not At Risk (11/08/2024)   Epic    Threatened with loss of utilities: No  Health Literacy: Not on file   Past Medical History:  Past Medical History:  Diagnosis Date   Anxiety    Bipolar affective (HCC)    Chronic headaches    Depression    Hypercholesteremia    Hypertension    Restless leg syndrome     Past Surgical History:  Procedure Laterality Date   ABDOMINAL HYSTERECTOMY     OOPHORECTOMY     TONSILLECTOMY      Current Medications: Current Facility-Administered Medications  Medication Dose Route Frequency Provider Last Rate Last Admin   acetaminophen  (TYLENOL ) tablet 650 mg  650 mg Oral Q6H PRN McLauchlin, Angela, NP   650 mg at 11/22/24 2023   alum & mag hydroxide-simeth (MAALOX/MYLANTA) 200-200-20 MG/5ML suspension 30 mL  30 mL Oral Q4H PRN McLauchlin, Angela, NP   30 mL at 11/13/24 1457   artificial tears ophthalmic solution 1 drop  1 drop Both Eyes TID PRN Donnelly Mellow, MD       aspirin  EC tablet 81 mg  81 mg Oral QHS Adrinne Sze, MD   81 mg at 11/23/24 2205   clonazePAM  (KLONOPIN ) disintegrating tablet 0.25 mg  0.25 mg Oral BID Gianlucas Evenson, MD   0.25 mg at 11/24/24 9048   haloperidol  (HALDOL ) tablet 5 mg  5 mg Oral TID PRN McLauchlin, Angela, NP       And   diphenhydrAMINE  (BENADRYL ) capsule 50 mg  50 mg Oral TID PRN McLauchlin, Angela, NP       haloperidol  lactate (HALDOL ) injection 5 mg  5 mg Intramuscular TID PRN McLauchlin, Jon, NP       And   diphenhydrAMINE  (BENADRYL ) injection 50 mg  50 mg Intramuscular TID PRN McLauchlin, Jon, NP       And   LORazepam  (ATIVAN ) injection 2 mg  2 mg Intramuscular TID PRN McLauchlin, Angela, NP       haloperidol  lactate (HALDOL ) injection 10 mg  10 mg Intramuscular TID PRN McLauchlin, Angela, NP       And   diphenhydrAMINE  (BENADRYL ) injection 50 mg  50 mg Intramuscular TID PRN McLauchlin, Angela, NP       And   LORazepam   (ATIVAN ) injection 2 mg  2 mg Intramuscular TID PRN McLauchlin, Angela, NP       gabapentin  (NEURONTIN ) capsule 300 mg  300 mg Oral TID Cosette Prindle, MD   300 mg at 11/24/24 0951   spironolactone  (ALDACTONE )  tablet 25 mg  25 mg Oral Daily Greenwood, Howard F, RPH   25 mg at 11/24/24 9042   And   hydrochlorothiazide  (HYDRODIURIL ) tablet 25 mg  25 mg Oral Daily Greenwood, Howard F, RPH   25 mg at 11/24/24 9048   hydrOXYzine  (ATARAX ) tablet 25 mg  25 mg Oral TID PRN McLauchlin, Angela, NP   25 mg at 11/24/24 9048   irbesartan  (AVAPRO ) tablet 37.5 mg  37.5 mg Oral Daily Gyneth Hubka, MD   37.5 mg at 11/24/24 9043   magnesium  hydroxide (MILK OF MAGNESIA) suspension 30 mL  30 mL Oral Daily PRN McLauchlin, Angela, NP       metoprolol  tartrate (LOPRESSOR ) tablet 25 mg  25 mg Oral BID PRN Chriss Redel, MD   25 mg at 11/17/24 9380   multivitamin with minerals tablet 1 tablet  1 tablet Oral q morning Ferdie Bakken, MD   1 tablet at 11/24/24 9048   naproxen  (NAPROSYN ) tablet 250 mg  250 mg Oral BID WC Mihika Surrette, MD   250 mg at 11/24/24 0805   nicotine  (NICODERM CQ  - dosed in mg/24 hours) patch 14 mg  14 mg Transdermal Daily Shedric Fredericks, MD   14 mg at 11/24/24 9048   pantoprazole  (PROTONIX ) EC tablet 40 mg  40 mg Oral Daily PRN Scout Gumbs, MD       rOPINIRole  (REQUIP ) tablet 1 mg  1 mg Oral QHS Alpha Mysliwiec, MD   1 mg at 11/23/24 2205   simvastatin  (ZOCOR ) tablet 20 mg  20 mg Oral QHS Claudina Oliphant, MD   20 mg at 11/23/24 2205   traZODone  (DESYREL ) tablet 50 mg  50 mg Oral QHS PRN,MR X 1 Ajibola, Ene A, NP   50 mg at 11/22/24 2101   venlafaxine  XR (EFFEXOR -XR) 24 hr capsule 150 mg  150 mg Oral Q breakfast Braiden Rodman, MD   150 mg at 11/24/24 9193    Lab Results:  No results found for this or any previous visit (from the past 48 hours).   Blood Alcohol  level:  Lab Results  Component Value Date   [facility] <15 11/07/2024    Metabolic Disorder Labs: No results found for:  HGBA1C, MPG Lab Results  Component Value Date   PROLACTIN 8.7 11/07/2024   Lab Results  Component Value Date   CHOL 174 03/07/2024   TRIG 62 03/07/2024   HDL 62 03/07/2024   CHOLHDL 2.8 03/07/2024   VLDL 28 11/18/2007   LDLCALC 100 (H) 03/07/2024   LDLCALC (H) 11/18/2007    183        Total Cholesterol/HDL:CHD Risk Coronary Heart Disease Risk Table                     Men   Women  1/2 Average Risk   3.4   3.3    Physical Findings: AIMS:  , ,  ,  ,    CIWA:    COWS:      Psychiatric Specialty Exam:  Presentation  General Appearance:  Appropriate for Environment; Casual  Eye Contact: Minimal  Speech: Normal Rate  Speech Volume: Decreased    Mood and Affect  Mood: Anxious Affect: Depressed; Flat   Thought Process  Thought Processes: Coherent  Orientation:Full (Time, Place and Person)  Thought Content:Logical  Hallucinations: Denies  Ideas of Reference:None  Suicidal Thoughts: Denies active SI  Homicidal Thoughts: Denies   Sensorium  Memory: Immediate Fair; Remote Fair  Judgment: Impaired  Insight: Shallow  Executive Functions  Concentration: Fair  Attention Span: Fair  Recall: [employer] of Knowledge: Fair  Language: Fair   Psychomotor Activity  Psychomotor Activity: No data recorded  Musculoskeletal: Strength & Muscle Tone: within normal limits Gait & Station: normal Assets  Assets: Manufacturing Systems Engineer; Desire for Improvement    Physical Exam: Physical Exam Vitals and nursing note reviewed.    ROS Blood pressure 113/66, pulse 81, temperature 98.3 F (36.8 C), resp. rate 14, height 5' 2 (1.575 m), weight 51.3 kg, SpO2 99%. Body mass index is 20.67 kg/m.  Diagnosis: Active Problems:   Bipolar I disorder, most recent episode depressed [facility])   Clinical Decision Making: Patient is currently admitted after a suicide attempt by cutting herself in the context of worsening depression and grieving  after the loss of her father and boyfriend.  She will be monitored closely on the inpatient unit  Treatment Plan Summary:  Safety and Monitoring:             -- Voluntary admission to inpatient psychiatric unit for safety, stabilization and treatment             -- Daily contact with patient to assess and evaluate symptoms and progress in treatment             -- Patient's case to be discussed in multi-disciplinary team meeting             -- Observation Level: q15 minute checks             -- Vital signs:  q12 hours             -- Precautions: suicide, elopement, and assault   2. Psychiatric Diagnoses and Treatment:                Klonopin  0. 2 5 mg nightly and 0. 2 5 mg as needed daily  Effexor  XR increased to 150 mg daily Reduced her gabapentin  to 300 mg 3 times daily -- The risks/benefits/side-effects/alternatives to this medication were discussed in detail with the patient and time was given for questions. The patient consents to medication trial.                -- Metabolic profile and EKG monitoring obtained while on an atypical antipsychotic (BMI: Lipid Panel: HbgA1c: QTc:)              -- Encouraged patient to participate in unit milieu and in scheduled group therapies                            3. Medical Issues Being Addressed:  Dizziness-PT consult is done for further guidance 4. Discharge Planning:   -- Social work and case management to assist with discharge planning and identification of hospital follow-up needs prior to discharge  -- Estimated LOS: 3-4 days  Allyn Foil, MD 11/24/2024, 12:36 PM

## 2024-11-24 NOTE — Progress Notes (Signed)
 SI/HI: denies  Behavior/Mood: Alert and oriented. Endorses anxiety. Denies AVH.   Interaction/Group: interacting with peers and staff. Attends group.   Medications/PRNs: po med compliant. PRNs given for anxiety.  Pain: denies  Other: slept 7.25 hours   11/23/24 2100  Psych Admission Type (Psych Patients Only)  Admission Status Voluntary  Psychosocial Assessment  Patient Complaints Anxiety  Eye Contact Fair  Facial Expression Animated  Affect Appropriate to circumstance  Speech Logical/coherent  Interaction Assertive  Motor Activity Slow  Appearance/Hygiene Unremarkable  Behavior Characteristics Cooperative  Mood Pleasant  Thought Process  Coherency WDL  Content WDL  Delusions None reported or observed  Perception WDL  Hallucination None reported or observed  Judgment Impaired  Confusion None  Danger to Self  Current suicidal ideation? Denies

## 2024-11-24 NOTE — BH IP Treatment Plan (Signed)
 Interdisciplinary Treatment and Diagnostic Plan Update  11/24/2024 Time of Session: 9:00 AM [name] Hinton MRN: [id number]  Principal Diagnosis: Bipolar 1 disorder (HCC)  Secondary Diagnoses: Active Problems:   Bipolar I disorder, most recent episode depressed (HCC)   Current Medications:  Current Facility-Administered Medications  Medication Dose Route Frequency Provider Last Rate Last Admin   acetaminophen  (TYLENOL ) tablet 650 mg  650 mg Oral Q6H PRN McLauchlin, Angela, NP   650 mg at 11/22/24 2023   alum & mag hydroxide-simeth (MAALOX/MYLANTA) 200-200-20 MG/5ML suspension 30 mL  30 mL Oral Q4H PRN McLauchlin, Angela, NP   30 mL at 11/13/24 1457   artificial tears ophthalmic solution 1 drop  1 drop Both Eyes TID PRN Jadapalle, Sree, MD       aspirin  EC tablet 81 mg  81 mg Oral QHS Jadapalle, Sree, MD   81 mg at 11/23/24 2205   clonazePAM  (KLONOPIN ) disintegrating tablet 0.25 mg  0.25 mg Oral BID Jadapalle, Sree, MD   0.25 mg at 11/24/24 9048   haloperidol  (HALDOL ) tablet 5 mg  5 mg Oral TID PRN McLauchlin, Angela, NP       And   diphenhydrAMINE  (BENADRYL ) capsule 50 mg  50 mg Oral TID PRN McLauchlin, Angela, NP       haloperidol  lactate (HALDOL ) injection 5 mg  5 mg Intramuscular TID PRN McLauchlin, Jon, NP       And   diphenhydrAMINE  (BENADRYL ) injection 50 mg  50 mg Intramuscular TID PRN McLauchlin, Jon, NP       And   LORazepam  (ATIVAN ) injection 2 mg  2 mg Intramuscular TID PRN McLauchlin, Jon, NP       haloperidol  lactate (HALDOL ) injection 10 mg  10 mg Intramuscular TID PRN McLauchlin, Angela, NP       And   diphenhydrAMINE  (BENADRYL ) injection 50 mg  50 mg Intramuscular TID PRN McLauchlin, Angela, NP       And   LORazepam  (ATIVAN ) injection 2 mg  2 mg Intramuscular TID PRN McLauchlin, Angela, NP       gabapentin  (NEURONTIN ) capsule 300 mg  300 mg Oral TID Jadapalle, Sree, MD   300 mg at 11/24/24 9048   spironolactone  (ALDACTONE ) tablet 25 mg  25 mg Oral Daily  Greenwood, Howard F, RPH   25 mg at 11/24/24 9042   And   hydrochlorothiazide  (HYDRODIURIL ) tablet 25 mg  25 mg Oral Daily Greenwood, Howard F, RPH   25 mg at 11/24/24 9048   hydrOXYzine  (ATARAX ) tablet 25 mg  25 mg Oral TID PRN McLauchlin, Angela, NP   25 mg at 11/24/24 9048   irbesartan  (AVAPRO ) tablet 37.5 mg  37.5 mg Oral Daily Jadapalle, Sree, MD   37.5 mg at 11/24/24 9043   magnesium  hydroxide (MILK OF MAGNESIA) suspension 30 mL  30 mL Oral Daily PRN McLauchlin, Angela, NP       metoprolol  tartrate (LOPRESSOR ) tablet 25 mg  25 mg Oral BID PRN Jadapalle, Sree, MD   25 mg at 11/17/24 9380   multivitamin with minerals tablet 1 tablet  1 tablet Oral q morning Jadapalle, Sree, MD   1 tablet at 11/24/24 9048   naproxen  (NAPROSYN ) tablet 250 mg  250 mg Oral BID WC Jadapalle, Sree, MD   250 mg at 11/24/24 0805   nicotine  (NICODERM CQ  - dosed in mg/24 hours) patch 14 mg  14 mg Transdermal Daily Jadapalle, Sree, MD   14 mg at 11/24/24 0951   pantoprazole  (PROTONIX ) EC tablet 40 mg  40 mg Oral Daily PRN Jadapalle, Sree, MD       rOPINIRole  (REQUIP ) tablet 1 mg  1 mg Oral QHS Jadapalle, Sree, MD   1 mg at 11/23/24 2205   simvastatin  (ZOCOR ) tablet 20 mg  20 mg Oral QHS Jadapalle, Sree, MD   20 mg at 11/23/24 2205   traZODone  (DESYREL ) tablet 50 mg  50 mg Oral QHS PRN,MR X 1 [place], Ene A, NP   50 mg at 11/22/24 2101   venlafaxine  XR (EFFEXOR -XR) 24 hr capsule 150 mg  150 mg Oral Q breakfast Jadapalle, Sree, MD   150 mg at 11/24/24 0806   PTA Medications: Medications Prior to Admission  Medication Sig Dispense Refill Last Dose/Taking   ARTIFICIAL TEARS PF 0.1-0.3 % SOLN Place 1 drop into both eyes 3 (three) times daily as needed (for dryness).      aspirin  EC 81 MG tablet Take 81 mg by mouth at bedtime. Swallow whole.      Aspirin -Acetaminophen -Caffeine (GOODY HEADACHE PO) Take 1 packet by mouth as needed (for headaches).      buPROPion  (WELLBUTRIN  XL) 150 MG 24 hr tablet Take 150 mg by mouth every  morning.      Calcium  Carb-Cholecalciferol 600-5 MG-MCG TABS Take 1 tablet by mouth daily.      clonazePAM  (KLONOPIN ) 0.5 MG tablet Take 0.5 mg by mouth 2 (two) times daily as needed for anxiety.      gabapentin  (NEURONTIN ) 300 MG capsule TAKE 1 CAPSULE BY MOUTH THREE TIMES A DAY 270 capsule 2    ibuprofen (ADVIL) 200 MG tablet Take 800 mg by mouth every 6 (six) hours as needed for headache or mild pain.      metoprolol  tartrate (LOPRESSOR ) 50 MG tablet TAKE 1 TABLET BY MOUTH 2 TIMES DAILY AS NEEDED (MAY TAKE 50MG  1-2 TIMES DAILY FOR IRREGULAR HEART BEAT). 180 tablet 1    Multiple Vitamin (MULTIVITAMIN WITH MINERALS) TABS Take 1 tablet by mouth every morning.      naproxen  sodium (ALEVE ) 220 MG tablet Take 220-440 mg by mouth 2 (two) times daily as needed (for mild pain or headaches).      omeprazole (PRILOSEC OTC) 20 MG tablet Take 20 mg by mouth daily as needed (for reflux).      rOPINIRole  (REQUIP ) 1 MG tablet Take 1 mg by mouth at bedtime.      simvastatin  (ZOCOR ) 20 MG tablet Take 20 mg by mouth at bedtime.      spironolactone -hydrochlorothiazide  (ALDACTAZIDE) 25-25 MG tablet Take 1 tablet by mouth daily. 30 tablet 11    TYLENOL  8 HOUR 650 MG CR tablet Take 650-1,300 mg by mouth every 8 (eight) hours as needed for pain (or headaches).      valsartan (DIOVAN) 320 MG tablet Take 320 mg by mouth daily.      VRAYLAR  1.5 MG capsule Take 1.5 mg by mouth daily.       Patient Stressors:    Patient Strengths:    Treatment Modalities: Medication Management, Group therapy, Case management,  1 to 1 session with clinician, Psychoeducation, Recreational therapy.   Physician Treatment Plan for Primary Diagnosis: Bipolar 1 disorder (HCC) Long Term Goal(s): Improvement in symptoms so as ready for discharge   Short Term Goals: Ability to identify changes in lifestyle to reduce recurrence of condition will improve Ability to verbalize feelings will improve Ability to disclose and discuss suicidal  ideas Ability to demonstrate self-control will improve Ability to identify and develop effective coping behaviors will improve  Medication Management: Evaluate patient's response, side effects, and tolerance of medication regimen.  Therapeutic Interventions: 1 to 1 sessions, Unit Group sessions and Medication administration.  Evaluation of Outcomes: Progressing  Physician Treatment Plan for Secondary Diagnosis: Active Problems:   Bipolar I disorder, most recent episode depressed (HCC)  Long Term Goal(s): Improvement in symptoms so as ready for discharge   Short Term Goals: Ability to identify changes in lifestyle to reduce recurrence of condition will improve Ability to verbalize feelings will improve Ability to disclose and discuss suicidal ideas Ability to demonstrate self-control will improve Ability to identify and develop effective coping behaviors will improve     Medication Management: Evaluate patient's response, side effects, and tolerance of medication regimen.  Therapeutic Interventions: 1 to 1 sessions, Unit Group sessions and Medication administration.  Evaluation of Outcomes: Progressing   RN Treatment Plan for Primary Diagnosis: Bipolar 1 disorder (HCC) Long Term Goal(s): Knowledge of disease and therapeutic regimen to maintain health will improve  Short Term Goals: Ability to verbalize frustration and anger appropriately will improve, Ability to demonstrate self-control, Ability to participate in decision making will improve, Ability to verbalize feelings will improve, Ability to disclose and discuss suicidal ideas, and Ability to identify and develop effective coping behaviors will improve  Medication Management: RN will administer medications as ordered by provider, will assess and evaluate patient's response and provide education to patient for prescribed medication. RN will report any adverse and/or side effects to prescribing provider.  Therapeutic  Interventions: 1 on 1 counseling sessions, Psychoeducation, Medication administration, Evaluate responses to treatment, Monitor vital signs and CBGs as ordered, Perform/monitor CIWA, COWS, AIMS and Fall Risk screenings as ordered, Perform wound care treatments as ordered.  Evaluation of Outcomes: Progressing   LCSW Treatment Plan for Primary Diagnosis: Bipolar 1 disorder (HCC) Long Term Goal(s): Safe transition to appropriate next level of care at discharge, Engage patient in therapeutic group addressing interpersonal concerns.  Short Term Goals: Engage patient in aftercare planning with referrals and resources, Increase social support, Increase ability to appropriately verbalize feelings, Increase emotional regulation, Facilitate acceptance of mental health diagnosis and concerns, Facilitate patient progression through stages of change regarding substance use diagnoses and concerns, Identify triggers associated with mental health/substance abuse issues, and Increase skills for wellness and recovery  Therapeutic Interventions: Assess for all discharge needs, 1 to 1 time with Social worker, Explore available resources and support systems, Assess for adequacy in community support network, Educate family and significant other(s) on suicide prevention, Complete Psychosocial Assessment, Interpersonal group therapy.  Evaluation of Outcomes: Progressing   Progress in Treatment: Attending groups: Yes. 11/24/24 Update: Yes.  Participating in groups: Yes. 11/24/24 Update: Yes.  Taking medication as prescribed: Yes.  11/24/24 Update: Yes.  Toleration medication: Yes.  11/24/24 Update: Yes.  Family/Significant other contact made: Yes. Dickey Ramus, mother, [phone]. Mother declined SPE. 11/24/24 Update: Yes.   Patient understands diagnosis: Yes.  11/24/24 Update: Yes.  Discussing patient identified problems/goals with staff: Yes. 11/24/24 Update: Yes.  Medical problems stabilized or resolved: Yes.  11/24/24 Update: Yes.  Denies suicidal/homicidal ideation: No. 11/24/24 Update: No.  Issues/concerns per patient self-inventory: No. 11/24/24 Update: No. Other: None 11/24/24 Update: None.     New problem(s) identified: No, Describe:  None identified 11/14/24 Update: None. Update 11/19/24: No changes at this time 11/24/24 Update: No changes at this time.    New Short Term/Long Term Goal(s):  elimination of symptoms of psychosis, medication management for mood stabilization; elimination of SI thoughts; development of comprehensive  mental wellness plan. 11/14/24 Update: No changes at this time. Update 11/19/24: No changes at this time 11/24/24 Update: No changes at this time.    Patient Goals:  Get back on my medications and clear my head of the thoughts of not wanting to live 11/14/24 Update: No changes at this time. Update 11/19/24: No changes at this time 11/24/24 Update: No changes at this time.    Discharge Plan or Barriers: CSW will assist with appropriate discharge planning 11/14/24 Update: No changes at this time. CSW to continue to engage with team and patient for safe discharge planning. Update 11/19/24: No changes at this time 11/24/24 Update: No changes at this time.    Reason for Continuation of Hospitalization: Depression Medication stabilization Suicidal ideation 11/14/24 Update: No changes at this time.    Estimated Length of Stay: 1 to 7 days 11/14/24 Update: TBD.  Update 11/19/24: TBD 11/24/24 Update: Pending 11/28/24 or TBD.  Last 3 [place] Suicide Severity Risk Score: Flowsheet Row Admission (Current) from 11/08/2024 in [facility] ED from 11/07/2024 in [facility]  C-SSRS RISK CATEGORY High Risk High Risk Low Risk    Last PHQ 2/9 Scores:    10/02/2021    3:54 PM  Depression screen PHQ 2/9  Decreased Interest 3  Down, Depressed, Hopeless 3  PHQ  - 2 Score 6  Altered sleeping 3  Tired, decreased energy 3  Change in appetite 0  Feeling bad or failure about yourself  3  Trouble concentrating 3  Moving slowly or fidgety/restless 0  Suicidal thoughts 3  PHQ-9 Score 21   Difficult doing work/chores Somewhat difficult     Data saved with a previous flowsheet row definition    Scribe for Treatment Team: Graceanne Guin M Maxene Byington, KEN 11/24/2024 12:35 PM

## 2024-11-24 NOTE — Group Note (Signed)
 Date:  11/24/2024 Time:  10:35 AM  Group Topic/Focus:  Christmas Movie     Participation Level:  Active  Participation Quality:  Appropriate  Affect:  Appropriate  Cognitive:  Appropriate  Insight: Appropriate  Engagement in Group:  Engaged  Modes of Intervention:  Activity and Socialization  Additional Comments:  none  Norleen SHAUNNA Bias 11/24/2024, 10:35 AM

## 2024-11-24 NOTE — Progress Notes (Signed)
" °   11/24/24 1000  Psych Admission Type (Psych Patients Only)  Admission Status Voluntary  Psychosocial Assessment  Patient Complaints Anxiety  Eye Contact Fair  Facial Expression Animated  Affect Appropriate to circumstance  Speech Logical/coherent  Interaction Assertive  Motor Activity Slow  Appearance/Hygiene Unremarkable  Behavior Characteristics Cooperative  Mood Pleasant  Thought Process  Coherency WDL  Content WDL  Delusions None reported or observed  Perception WDL  Hallucination None reported or observed  Judgment WDL  Confusion None  Danger to Self  Current suicidal ideation? Denies  Danger to Others  Danger to Others None reported or observed   PRN medication X1 for anxiety this shift "

## 2024-11-25 DIAGNOSIS — F319 Bipolar disorder, unspecified: Secondary | ICD-10-CM | POA: Diagnosis not present

## 2024-11-25 NOTE — Progress Notes (Signed)
 [facility] MD Progress Note  11/25/2024 12:38 PM [name] Hinton  MRN:  [id number]  Patient is a 63 year old female with a history of Bipolar Disorder who presents voluntarily to [facility] for assessment. Patient presents via [facility] via [employer] with chief complaint of suicide attempt. [employer] was contacted by patient's mother after patient place a note on the bathroom door stating: Mom do not open this door!  Just call 911, and let them come in here.  You do not need to see me.  I am sorry!  This is what you need to tell the 911 operator: Tell them I told you not to come in.  I have slit my wrist and bled out.  I am dead (I hope!).    Patient endorses having suicidal thoughts for the past week and she confirms that today she locked herself in the bathroom with a knife and started to cut her wrist. Client also reported overdosing on 1400mg  of her heart medications a week ago.Patient is admitted to [facility] unit with Q15 min safety monitoring. Multidisciplinary team approach is offered. Medication management; group/milieu therapy is offered.   Subjective:  Chart reviewed, case discussed in multidisciplinary meeting, patient seen during rounds.   Today on interview patient is noted to be sitting in bed.  She reports that today she is feeling a lot better than yesterday as holidays are difficult without her dad and currently being aware that she is homeless needs to figure out how to survive on the streets.  She continues to assure the provider that she will be engaging in outpatient mental health resources when she gets to the shelter.  She reports that she can convince her mom to give her a ride to her appointments.  Patient and provider discussed the availability of beds at the [facility] which usually happens on weekdays and also patient being on the wait list for [facility].  She denies active SI/HI/intent/plan and reports she will try to maintain her safety in the community .  Provider educated  patient to utilize emergency room services and outpatient mental health services to maintain her safety and reach out for crisis help anytime of the day.  Past Psychiatric History: see h&P Family History:  Family History  Problem Relation Age of Onset   Breast cancer Neg Hx    Social History:  Social History   Substance and Sexual Activity  Alcohol  Use No     Social History   Substance and Sexual Activity  Drug Use No    Social History   Socioeconomic History   Marital status: Divorced    Spouse name: Not on file   Number of children: Not on file   Years of education: Not on file   Highest education level: Not on file  Occupational History   Not on file  Tobacco Use   Smoking status: Some Days    Types: Cigarettes   Smokeless tobacco: Never   Tobacco comments:    03/07/2024 patient smokes about 4-5 cigarettes some days    02/09/2023 A pack last patient about a week  Substance and Sexual Activity   Alcohol  use: No   Drug use: No   Sexual activity: Yes    Birth control/protection: Surgical  Other Topics Concern   Not on file  Social History Narrative   Not on file   Social Drivers of Health   Tobacco Use: High Risk (11/08/2024)   Patient History    Smoking Tobacco  Use: Some Days    Smokeless Tobacco Use: Never    Passive Exposure: Not on file  Financial Resource Strain: Not on File (09/25/2022)   Received from [facility]    Financial Resource Strain: 0  Food Insecurity: No Food Insecurity (11/08/2024)   Epic    Worried About Programme Researcher, Broadcasting/film/video in the Last Year: Never true    Ran Out of Food in the Last Year: Never true  Transportation Needs: No Transportation Needs (11/08/2024)   Epic    Lack of Transportation (Medical): No    Lack of Transportation (Non-Medical): No  Physical Activity: Not on File (03/20/2022)   Received from [facility]   Physical Activity    Physical Activity: 0  Stress: Not on File (03/20/2022)   Received from [facility]    Stress    Stress: 0  Social Connections: Not on File (08/10/2023)   Received from [facility]   Social Connections    Connectedness: 0  Depression (PHQ2-9): Not on file  Alcohol  Screen: Low Risk (11/07/2024)   Alcohol  Screen    Last Alcohol  Screening Score (AUDIT): 1  Housing: Low Risk (11/08/2024)   Epic    Unable to Pay for Housing in the Last Year: No    Number of Times Moved in the Last Year: 0    Homeless in the Last Year: No  Utilities: Not At Risk (11/08/2024)   Epic    Threatened with loss of utilities: No  Health Literacy: Not on file   Past Medical History:  Past Medical History:  Diagnosis Date   Anxiety    Bipolar affective (HCC)    Chronic headaches    Depression    Hypercholesteremia    Hypertension    Restless leg syndrome     Past Surgical History:  Procedure Laterality Date   ABDOMINAL HYSTERECTOMY     OOPHORECTOMY     TONSILLECTOMY      Current Medications: Current Facility-Administered Medications  Medication Dose Route Frequency Provider Last Rate Last Admin   acetaminophen  (TYLENOL ) tablet 650 mg  650 mg Oral Q6H PRN McLauchlin, Angela, NP   650 mg at 11/25/24 1106   alum & mag hydroxide-simeth (MAALOX/MYLANTA) 200-200-20 MG/5ML suspension 30 mL  30 mL Oral Q4H PRN McLauchlin, Angela, NP   30 mL at 11/13/24 1457   artificial tears ophthalmic solution 1 drop  1 drop Both Eyes TID PRN Chabely Norby, MD       aspirin  EC tablet 81 mg  81 mg Oral QHS Kru Allman, MD   81 mg at 11/24/24 2115   clonazePAM  (KLONOPIN ) disintegrating tablet 0.25 mg  0.25 mg Oral BID Helga Asbury, MD   0.25 mg at 11/25/24 9165   haloperidol  (HALDOL ) tablet 5 mg  5 mg Oral TID PRN McLauchlin, Angela, NP       And   diphenhydrAMINE  (BENADRYL ) capsule 50 mg  50 mg Oral TID PRN McLauchlin, Jon, NP       haloperidol  lactate (HALDOL ) injection 5 mg  5 mg Intramuscular TID PRN McLauchlin, Jon, NP       And   diphenhydrAMINE  (BENADRYL ) injection 50 mg  50 mg Intramuscular  TID PRN McLauchlin, Angela, NP       And   LORazepam  (ATIVAN ) injection 2 mg  2 mg Intramuscular TID PRN McLauchlin, Angela, NP       haloperidol  lactate (HALDOL ) injection 10 mg  10 mg Intramuscular TID PRN McLauchlin, Angela, NP  And   diphenhydrAMINE  (BENADRYL ) injection 50 mg  50 mg Intramuscular TID PRN McLauchlin, Angela, NP       And   LORazepam  (ATIVAN ) injection 2 mg  2 mg Intramuscular TID PRN McLauchlin, Angela, NP       gabapentin  (NEURONTIN ) capsule 300 mg  300 mg Oral TID Karl Erway, MD   300 mg at 11/25/24 0835   spironolactone  (ALDACTONE ) tablet 25 mg  25 mg Oral Daily Niels Kayla FALCON, RPH   25 mg at 11/25/24 0848   And   hydrochlorothiazide  (HYDRODIURIL ) tablet 25 mg  25 mg Oral Daily Niels Kayla FALCON, RPH   25 mg at 11/25/24 0834   hydrOXYzine  (ATARAX ) tablet 25 mg  25 mg Oral TID PRN McLauchlin, Angela, NP   25 mg at 11/24/24 0951   irbesartan  (AVAPRO ) tablet 37.5 mg  37.5 mg Oral Daily Georgios Kina, MD   37.5 mg at 11/25/24 9165   magnesium  hydroxide (MILK OF MAGNESIA) suspension 30 mL  30 mL Oral Daily PRN McLauchlin, Angela, NP       metoprolol  tartrate (LOPRESSOR ) tablet 25 mg  25 mg Oral BID PRN Dvora Buitron, MD   25 mg at 11/17/24 9380   multivitamin with minerals tablet 1 tablet  1 tablet Oral q morning Princess Karnes, MD   1 tablet at 11/25/24 9167   naproxen  (NAPROSYN ) tablet 250 mg  250 mg Oral BID WC Rakeen Gaillard, MD   250 mg at 11/25/24 9161   nicotine  (NICODERM CQ  - dosed in mg/24 hours) patch 14 mg  14 mg Transdermal Daily Chardonnay Holzmann, MD   14 mg at 11/25/24 9160   pantoprazole  (PROTONIX ) EC tablet 40 mg  40 mg Oral Daily PRN Halia Franey, MD       rOPINIRole  (REQUIP ) tablet 1 mg  1 mg Oral QHS Chelsie Burel, MD   1 mg at 11/24/24 2113   simvastatin  (ZOCOR ) tablet 20 mg  20 mg Oral QHS Anik Wesch, MD   20 mg at 11/24/24 2113   traZODone  (DESYREL ) tablet 50 mg  50 mg Oral QHS PRN,MR X 1 Ajibola, Ene A, NP   50 mg at  11/24/24 2230   venlafaxine  XR (EFFEXOR -XR) 24 hr capsule 150 mg  150 mg Oral Q breakfast Joylynn Defrancesco, MD   150 mg at 11/25/24 9166    Lab Results:  No results found for this or any previous visit (from the past 48 hours).   Blood Alcohol  level:  Lab Results  Component Value Date   [facility] <15 11/07/2024    Metabolic Disorder Labs: No results found for: HGBA1C, MPG Lab Results  Component Value Date   PROLACTIN 8.7 11/07/2024   Lab Results  Component Value Date   CHOL 174 03/07/2024   TRIG 62 03/07/2024   HDL 62 03/07/2024   CHOLHDL 2.8 03/07/2024   VLDL 28 11/18/2007   LDLCALC 100 (H) 03/07/2024   LDLCALC (H) 11/18/2007    183        Total Cholesterol/HDL:CHD Risk Coronary Heart Disease Risk Table                     Men   Women  1/2 Average Risk   3.4   3.3    Physical Findings: AIMS:  , ,  ,  ,    CIWA:    COWS:      Psychiatric Specialty Exam:  Presentation  General Appearance:  Appropriate for Environment; Casual  Eye Contact: Minimal  Speech: Normal Rate  Speech Volume: Decreased    Mood and Affect  Mood: Anxious Affect: Depressed; Flat   Thought Process  Thought Processes: Coherent  Orientation:Full (Time, Place and Person)  Thought Content:Logical  Hallucinations: Denies  Ideas of Reference:None  Suicidal Thoughts: Denies active SI  Homicidal Thoughts: Denies   Sensorium  Memory: Immediate Fair; Remote Fair  Judgment: Impaired  Insight: Shallow   Executive Functions  Concentration: Fair  Attention Span: Fair  Recall: [employer] of Knowledge: Fair  Language: Fair   Psychomotor Activity  Psychomotor Activity: No data recorded  Musculoskeletal: Strength & Muscle Tone: within normal limits Gait & Station: normal Assets  Assets: Manufacturing Systems Engineer; Desire for Improvement    Physical Exam: Physical Exam Vitals and nursing note reviewed.    ROS Blood pressure 117/68, pulse 90,  temperature (!) 97.4 F (36.3 C), resp. rate 12, height 5' 2 (1.575 m), weight 51.3 kg, SpO2 100%. Body mass index is 20.67 kg/m.  Diagnosis: Active Problems:   Bipolar I disorder, most recent episode depressed [facility])   Clinical Decision Making: Patient is currently admitted after a suicide attempt by cutting herself in the context of worsening depression and grieving after the loss of her father and boyfriend.  She will be monitored closely on the inpatient unit  Treatment Plan Summary:  Safety and Monitoring:             -- Voluntary admission to inpatient psychiatric unit for safety, stabilization and treatment             -- Daily contact with patient to assess and evaluate symptoms and progress in treatment             -- Patient's case to be discussed in multi-disciplinary team meeting             -- Observation Level: q15 minute checks             -- Vital signs:  q12 hours             -- Precautions: suicide, elopement, and assault   2. Psychiatric Diagnoses and Treatment:                Klonopin  0. 2 5 mg nightly and 0. 2 5 mg as needed daily-patient showed significant improvement in her anxiety and mood  Effexor  XR  150 mg daily Reduced her gabapentin  to 300 mg 3 times daily -- The risks/benefits/side-effects/alternatives to this medication were discussed in detail with the patient and time was given for questions. The patient consents to medication trial.                -- Metabolic profile and EKG monitoring obtained while on an atypical antipsychotic (BMI: Lipid Panel: HbgA1c: QTc:)              -- Encouraged patient to participate in unit milieu and in scheduled group therapies                            3. Medical Issues Being Addressed:  Dizziness-PT consult is done for further guidance 4. Discharge Planning:   -- Social work and case management to assist with discharge planning and identification of hospital follow-up needs prior to discharge  -- Estimated LOS: 3-4  days  Allyn Foil, MD 11/25/2024, 12:38 PM

## 2024-11-25 NOTE — Progress Notes (Signed)
(  Sleep Hours) - 5.25 (Any PRNs that were needed, meds refused, or side effects to meds)- Trazodone : Effective (Any disturbances and when (visitation, over night)- None reported/observed (Concerns raised by the patient)- None reported/observed (SI/HI/AVH)- Denies

## 2024-11-25 NOTE — Group Note (Signed)
 Date:  11/25/2024 Time:  3:46 PM  Group Topic/Focus:  Meditation Therapy    Participation Level:  Active  Participation Quality:  Appropriate  Affect:  Appropriate  Cognitive:  Appropriate  Insight: Appropriate  Engagement in Group:  Engaged  Modes of Intervention:  Activity  Additional Comments:  none  Norleen SHAUNNA Bias 11/25/2024, 3:46 PM

## 2024-11-25 NOTE — Group Note (Signed)
 Date:  11/25/2024 Time:  9:29 PM  Group Topic/Focus:  Wrap-Up Group:   The focus of this group is to help patients review their daily goal of treatment and discuss progress on daily workbooks.    Participation Level:  Active  Participation Quality:  Appropriate  Affect:  Appropriate  Cognitive:  Appropriate  Insight: Appropriate  Engagement in Group:  Engaged  Modes of Intervention:  Discussion  Additional Comments:    [name] Hinton 11/25/2024, 9:29 PM

## 2024-11-25 NOTE — Group Note (Signed)
 Date:  11/25/2024 Time:  10:37 AM  Group Topic/Focus:  Movement Therapy, Morning Stretch with Bertin Inabinet.    Participation Level:  Active  Participation Quality:  Appropriate  Affect:  Appropriate  Cognitive:  Appropriate  Insight: Appropriate  Engagement in Group:  Engaged  Modes of Intervention:  Activity  Additional Comments:  none  Norleen SHAUNNA Bias 11/25/2024, 10:37 AM

## 2024-11-25 NOTE — Plan of Care (Signed)
" °  Problem: Education: Goal: Ability to state activities that reduce stress will improve Outcome: Progressing   Problem: Coping: Goal: Ability to identify and develop effective coping behavior will improve Outcome: Progressing   Problem: Health Behavior/Discharge Planning: Goal: Identification of resources available to assist in meeting health care needs will improve Outcome: Progressing Goal: Compliance with treatment plan for underlying cause of condition will improve Outcome: Progressing   Problem: Safety: Goal: Ability to disclose and discuss suicidal ideas will improve Outcome: Progressing Goal: Ability to identify and utilize support systems that promote safety will improve Outcome: Progressing   "

## 2024-11-25 NOTE — Progress Notes (Signed)
" °   11/25/24 1000  Psych Admission Type (Psych Patients Only)  Admission Status Voluntary  Psychosocial Assessment  Patient Complaints Anxiety  Eye Contact Fair  Facial Expression Animated  Affect Appropriate to circumstance  Speech Logical/coherent  Interaction Assertive  Motor Activity Slow  Appearance/Hygiene Unremarkable  Behavior Characteristics Cooperative  Mood Pleasant  Thought Process  Coherency WDL  Content WDL  Delusions None reported or observed  Perception WDL  Hallucination None reported or observed  Judgment WDL  Confusion None  Danger to Self  Current suicidal ideation? Denies  Danger to Others  Danger to Others None reported or observed    "

## 2024-11-25 NOTE — BHH Counselor (Signed)
 CSW met with the patient to discuss aftercare plans.   CSW updated pt that primary CSW has placed patient on waitlist for [facility].  CSW and patient discussed that the patient would be discharged to the [facility].  Patient reports understanding.  CSW reviewed [facility] with the patient.   Patient reports that at discharge she plans on getting safe transport to her mothers home, where she will gather warmer clothes and additional items and either her mother or her sister will transport her to the [facility].  Patient reports no issues or concerns at this time.   Sherryle Margo, MSW, LCSW 11/25/2024 2:04 PM

## 2024-11-25 NOTE — Group Note (Deleted)
 Date:  11/25/2024 Time:  8:47 PM  Group Topic/Focus:  Overcoming Stress:   The focus of this group is to define stress and help patients assess their triggers.     Participation Level:  {BHH PARTICIPATION OZCZO:77735}  Participation Quality:  {BHH PARTICIPATION QUALITY:22265}  Affect:  {BHH AFFECT:22266}  Cognitive:  {BHH COGNITIVE:22267}  Insight: {BHH Insight2:20797}  Engagement in Group:  {BHH ENGAGEMENT IN HMNLE:77731}  Modes of Intervention:  {BHH MODES OF INTERVENTION:22269}  Additional Comments:  PIERRETTE Ginny JONETTA Orma 11/25/2024, 8:47 PM

## 2024-11-26 DIAGNOSIS — F329 Major depressive disorder, single episode, unspecified: Secondary | ICD-10-CM

## 2024-11-26 MED ORDER — CLONAZEPAM 0.25 MG PO TBDP
0.2500 mg | ORAL_TABLET | Freq: Every day | ORAL | Status: DC
  Administered 2024-11-26 – 2024-11-27 (×2): 0.25 mg via ORAL
  Filled 2024-11-26 (×2): qty 1

## 2024-11-26 NOTE — Group Note (Signed)
 Date:  11/26/2024 Time:  8:43 PM  Group Topic/Focus:  Making Healthy Choices:   The focus of this group is to help patients identify negative/unhealthy choices they were using prior to admission and identify positive/healthier coping strategies to replace them upon discharge.    Participation Level:  Active  Participation Quality:  Appropriate  Affect:  Appropriate  Cognitive:  Appropriate  Insight: Good  Engagement in Group:  Engaged  Modes of Intervention:  Discussion  Additional Comments:    [name] Hinton 11/26/2024, 8:43 PM

## 2024-11-26 NOTE — Progress Notes (Signed)
 [facility] MD Progress Note  11/26/2024 5:58 PM [name] Hinton  MRN:  [id number]  Patient is a 63 year old female with a history of Bipolar Disorder who presents voluntarily to [facility] for assessment. Patient presents via [facility] via [employer] with chief complaint of suicide attempt. [employer] was contacted by patient's mother after patient place a note on the bathroom door stating: Mom do not open this door!  Just call 911, and let them come in here.  You do not need to see me.  I am sorry!  This is what you need to tell the 911 operator: Tell them I told you not to come in.  I have slit my wrist and bled out.  I am dead (I hope!).    Patient endorses having suicidal thoughts for the past week and she confirms that today she locked herself in the bathroom with a knife and started to cut her wrist. Client also reported overdosing on 1400mg  of her heart medications a week ago.Patient is admitted to [facility] unit with Q15 min safety monitoring. Multidisciplinary team approach is offered. Medication management; group/milieu therapy is offered.   Subjective:  Chart reviewed, case discussed in multidisciplinary meeting, patient seen during rounds.   The patient reported experiencing mood swings that occur when they go off their medication, with affective shifts occurring rapidly at times, though with intermittent periods of stability. The patient endorsed chronic feelings of depression, emptiness, and poor self-esteem, which worsen with medication nonadherence. The patient described longstanding anxiety treated intermittently with clonazepam  for approximately six years and noted excessive daytime sleepiness when taking Klonopin  in the morning.  The patient endorsed a history of impulsive behaviors during periods of emotional distress and heightened emotional reactivity to interpersonal stressors. The patient reported fears of emotional abandonment and difficulty maintaining stable relationships. The  patient denied current suicidal ideation, homicidal ideation, or urges to self-harm at the time of evaluation. Pertinent negatives include no recent cutting behavior.  Past Psychiatric History: see h&P Family History:  Family History  Problem Relation Age of Onset   Breast cancer Neg Hx    Social History:  Social History   Substance and Sexual Activity  Alcohol  Use No     Social History   Substance and Sexual Activity  Drug Use No    Social History   Socioeconomic History   Marital status: Divorced    Spouse name: Not on file   Number of children: Not on file   Years of education: Not on file   Highest education level: Not on file  Occupational History   Not on file  Tobacco Use   Smoking status: Some Days    Types: Cigarettes   Smokeless tobacco: Never   Tobacco comments:    03/07/2024 patient smokes about 4-5 cigarettes some days    02/09/2023 A pack last patient about a week  Substance and Sexual Activity   Alcohol  use: No   Drug use: No   Sexual activity: Yes    Birth control/protection: Surgical  Other Topics Concern   Not on file  Social History Narrative   Not on file   Social Drivers of Health   Tobacco Use: High Risk (11/08/2024)   Patient History    Smoking Tobacco Use: Some Days    Smokeless Tobacco Use: Never    Passive Exposure: Not on file  Financial Resource Strain: Not on File (09/25/2022)   Received from [employer]  Financial Resource Strain: 0  Food Insecurity: No Food Insecurity (11/08/2024)   Epic    Worried About Programme Researcher, Broadcasting/film/video in the Last Year: Never true    Ran Out of Food in the Last Year: Never true  Transportation Needs: No Transportation Needs (11/08/2024)   Epic    Lack of Transportation (Medical): No    Lack of Transportation (Non-Medical): No  Physical Activity: Not on File (03/20/2022)   Received from [facility]   Physical Activity    Physical Activity: 0  Stress: Not on File (03/20/2022)    Received from [facility]   Stress    Stress: 0  Social Connections: Not on File (08/10/2023)   Received [facility]   Social Connections    Connectedness: 0  Depression (PHQ2-9): Not on file  Alcohol  Screen: Low Risk (11/07/2024)   Alcohol  Screen    Last Alcohol  Screening Score (AUDIT): 1  Housing: Low Risk (11/08/2024)   Epic    Unable to Pay for Housing in the Last Year: No    Number of Times Moved in the Last Year: 0    Homeless in the Last Year: No  Utilities: Not At Risk (11/08/2024)   Epic    Threatened with loss of utilities: No  Health Literacy: Not on file   Past Medical History:  Past Medical History:  Diagnosis Date   Anxiety    Bipolar affective (HCC)    Chronic headaches    Depression    Hypercholesteremia    Hypertension    Restless leg syndrome     Past Surgical History:  Procedure Laterality Date   ABDOMINAL HYSTERECTOMY     OOPHORECTOMY     TONSILLECTOMY      Current Medications: Current Facility-Administered Medications  Medication Dose Route Frequency Provider Last Rate Last Admin   acetaminophen  (TYLENOL ) tablet 650 mg  650 mg Oral Q6H PRN McLauchlin, Angela, NP   650 mg at 11/26/24 1620   alum & mag hydroxide-simeth (MAALOX/MYLANTA) 200-200-20 MG/5ML suspension 30 mL  30 mL Oral Q4H PRN McLauchlin, Angela, NP   30 mL at 11/13/24 1457   artificial tears ophthalmic solution 1 drop  1 drop Both Eyes TID PRN Jadapalle, Sree, MD       aspirin  EC tablet 81 mg  81 mg Oral QHS Jadapalle, Sree, MD   81 mg at 11/25/24 2154   clonazePAM  (KLONOPIN ) disintegrating tablet 0.25 mg  0.25 mg Oral BID Jadapalle, Sree, MD   0.25 mg at 11/26/24 9173   haloperidol  (HALDOL ) tablet 5 mg  5 mg Oral TID PRN McLauchlin, Angela, NP       And   diphenhydrAMINE  (BENADRYL ) capsule 50 mg  50 mg Oral TID PRN McLauchlin, Jon, NP       haloperidol  lactate (HALDOL ) injection 5 mg  5 mg Intramuscular TID PRN McLauchlin, Jon, NP       And   diphenhydrAMINE  (BENADRYL ) injection 50 mg   50 mg Intramuscular TID PRN McLauchlin, Jon, NP       And   LORazepam  (ATIVAN ) injection 2 mg  2 mg Intramuscular TID PRN McLauchlin, Jon, NP       haloperidol  lactate (HALDOL ) injection 10 mg  10 mg Intramuscular TID PRN McLauchlin, Angela, NP       And   diphenhydrAMINE  (BENADRYL ) injection 50 mg  50 mg Intramuscular TID PRN McLauchlin, Angela, NP       And   LORazepam  (ATIVAN ) injection 2 mg  2 mg Intramuscular TID PRN  McLauchlin, Angela, NP       gabapentin  (NEURONTIN ) capsule 300 mg  300 mg Oral TID Jadapalle, Sree, MD   300 mg at 11/26/24 1620   spironolactone  (ALDACTONE ) tablet 25 mg  25 mg Oral Daily Greenwood, Howard F, RPH   25 mg at 11/26/24 9172   And   hydrochlorothiazide  (HYDRODIURIL ) tablet 25 mg  25 mg Oral Daily Greenwood, Howard F, RPH   25 mg at 11/26/24 9175   hydrOXYzine  (ATARAX ) tablet 25 mg  25 mg Oral TID PRN McLauchlin, Angela, NP   25 mg at 11/24/24 0951   irbesartan  (AVAPRO ) tablet 37.5 mg  37.5 mg Oral Daily Jadapalle, Sree, MD   37.5 mg at 11/26/24 9173   magnesium  hydroxide (MILK OF MAGNESIA) suspension 30 mL  30 mL Oral Daily PRN McLauchlin, Angela, NP       metoprolol  tartrate (LOPRESSOR ) tablet 25 mg  25 mg Oral BID PRN Jadapalle, Sree, MD   25 mg at 11/17/24 9380   multivitamin with minerals tablet 1 tablet  1 tablet Oral q morning Jadapalle, Sree, MD   1 tablet at 11/26/24 0825   naproxen  (NAPROSYN ) tablet 250 mg  250 mg Oral BID WC Jadapalle, Sree, MD   250 mg at 11/26/24 1047   nicotine  (NICODERM CQ  - dosed in mg/24 hours) patch 14 mg  14 mg Transdermal Daily Jadapalle, Sree, MD   14 mg at 11/26/24 0830   pantoprazole  (PROTONIX ) EC tablet 40 mg  40 mg Oral Daily PRN Jadapalle, Sree, MD       rOPINIRole  (REQUIP ) tablet 1 mg  1 mg Oral QHS Jadapalle, Sree, MD   1 mg at 11/25/24 2153   simvastatin  (ZOCOR ) tablet 20 mg  20 mg Oral QHS Jadapalle, Sree, MD   20 mg at 11/25/24 2153   traZODone  (DESYREL ) tablet 50 mg  50 mg Oral QHS PRN,MR X 1 Ajibola, Ene A,  NP   50 mg at 11/24/24 2230   venlafaxine  XR (EFFEXOR -XR) 24 hr capsule 150 mg  150 mg Oral Q breakfast Jadapalle, Sree, MD   150 mg at 11/26/24 9175    Lab Results:  No results found for this or any previous visit (from the past 48 hours).   Blood Alcohol  level:  Lab Results  Component Value Date   [facility] <15 11/07/2024    Metabolic Disorder Labs: No results found for: HGBA1C, MPG Lab Results  Component Value Date   PROLACTIN 8.7 11/07/2024   Lab Results  Component Value Date   CHOL 174 03/07/2024   TRIG 62 03/07/2024   HDL 62 03/07/2024   CHOLHDL 2.8 03/07/2024   VLDL 28 11/18/2007   LDLCALC 100 (H) 03/07/2024   LDLCALC (H) 11/18/2007    183        Total Cholesterol/HDL:CHD Risk Coronary Heart Disease Risk Table                     Men   Women  1/2 Average Risk   3.4   3.3    Physical Findings: AIMS:  , ,  ,  ,    CIWA:    COWS:      Psychiatric Specialty Exam:  Presentation  General Appearance:  Appropriate for Environment; Casual  Eye Contact: Minimal  Speech: Normal Rate  Speech Volume: Decreased    Mood and Affect  Mood: Anxious Affect: Depressed; Flat   Thought Process  Thought Processes: Coherent  Orientation:Full (Time, Place and Person)  Thought Content:Logical  Hallucinations: Denies  Ideas of Reference:None  Suicidal Thoughts: Denies active SI  Homicidal Thoughts: Denies   Sensorium  Memory: Immediate Fair; Remote Fair  Judgment: Impaired  Insight: Shallow   Executive Functions  Concentration: Fair  Attention Span: Fair  Recall: [employer] of Knowledge: Fair  Language: Fair   Psychomotor Activity  Psychomotor Activity: No data recorded  Musculoskeletal: Strength & Muscle Tone: within normal limits Gait & Station: normal Assets  Assets: Manufacturing Systems Engineer; Desire for Improvement    Physical Exam: Physical Exam Vitals and nursing note reviewed.    ROS Blood pressure 106/75,  pulse 74, temperature (!) 97.3 F (36.3 C), resp. rate 18, height 5' 2 (1.575 m), weight 51.3 kg, SpO2 100%. Body mass index is 20.67 kg/m.  Diagnosis: MDD  Clinical Decision Making: Patient is currently admitted after a suicide attempt by cutting herself in the context of worsening depression and grieving after the loss of her father and boyfriend.  She will be monitored closely on the inpatient unit  Treatment Plan Summary:  Safety and Monitoring:             -- Voluntary admission to inpatient psychiatric unit for safety, stabilization and treatment             -- Daily contact with patient to assess and evaluate symptoms and progress in treatment             -- Patient's case to be discussed in multi-disciplinary team meeting             -- Observation Level: q15 minute checks             -- Vital signs:  q12 hours             -- Precautions: suicide, elopement, and assault   2. Psychiatric Diagnoses and Treatment:               Dc morning clonazepam  and cont.  Klonopin  0. 25 mg nightly -patient showed significant improvement in her anxiety and mood  Effexor  XR  150 mg daily Reduced her gabapentin  to 300 mg 3 times daily -- The risks/benefits/side-effects/alternatives to this medication were discussed in detail with the patient and time was given for questions. The patient consents to medication trial.                -- Metabolic profile and EKG monitoring obtained while on an atypical antipsychotic (BMI: Lipid Panel: HbgA1c: QTc:)              -- Encouraged patient to participate in unit milieu and in scheduled group therapies                            3. Medical Issues Being Addressed:  Dizziness-PT consult is done for further guidance 4. Discharge Planning:   -- Social work and case management to assist with discharge planning and identification of hospital follow-up needs prior to discharge  -- Estimated LOS: 3-4 days  Desmond Chimera, MD 11/26/2024, 5:58 PM

## 2024-11-26 NOTE — Group Note (Signed)
 LCSW Group Therapy Note   Group Date: 11/26/2024 Start Time: 1400 End Time: 1430   Type of Therapy and Topic:  Group Therapy: Managing Intrusive Thoughts  Participation Level:  Active  Description of Group: The purpose of this group is to assist patients in learning how to regulate negative intrusive thoughts.  Intrusive thoughts are unwanted thoughts or vivid images that suddenly enter your mind.  They may be disturbing to the person experiencing them and may trigger feelings like anxiety, shame, or disgust.  Emphasis will be placed on coping with negative intrusive thoughts in situations and using positive strategies to combat negative intrusive thoughts and using positive strategies combat negative thoughts.  Therapeutic Goals:  1. Patient will identify the difference of an inconsequential thought vs a urgent thought. 2.Patient will discuss when they need to seek assistance with negative intrusive thoughts.     Summary of Patient Progress:   The facilitator and patient discussed negative intrusive thoughts and their impact on daily life.  Group members chose a negative intrusive thought and strategies for managing it .  The facilitator and patient examined how different experiences can influence their thoughts.  The patient reflected on how their thoughts, negative or positive can affect outcomes.  The patient was receptive to feedback from both peers and the facilitator and contributed to creating a supportive environment, encouraging others to open up and share.  Therapeutic Modalities:  Cognitive Behavioral Therapy Dialectical Behavioral Therapy  Jacobi Nile W Grier Czerwinski, LCSWA 11/26/2024  3:58 PM

## 2024-11-26 NOTE — Progress Notes (Signed)
 Patient is a voluntary commitment to [facility] with expected discharge to [facility] on Monday.  Patient is calm, cooperative and interacts well with peers and staff. Denies SI, HI, AVH, anxiety and depression - only c/o of headache for which she has been alternating tylenol  and naprosyn  for the last 2 days. Ambulates without assistance and completes her own ADL's.  Will continue to monitor.

## 2024-11-26 NOTE — Progress Notes (Signed)
 SI/HI: denies  Behavior/Mood: Alert and oriented. Pleasant and cooperative. Pt states she is feeling okay.  Reports intention to refuse morning dose of klonopin , states it makes her feel too sleepy. Pt prefers to take Vistaril  PRN during the day instead. Pt reports sleeping well throughout the night when asked.   Interactions/Group: visible in dayroom. Interacting with peers and staff. Attends group.  Medications/PRNs: po med compliant. No PRNs given.  Pain: denies  Other: slept 8 hours   11/25/24 2200  Psych Admission Type (Psych Patients Only)  Admission Status Voluntary  Psychosocial Assessment  Patient Complaints None  Eye Contact Fair  Facial Expression Animated  Affect Appropriate to circumstance  Speech Logical/coherent  Interaction Assertive  Motor Activity Slow  Appearance/Hygiene Unremarkable  Behavior Characteristics Cooperative  Mood Pleasant  Thought Process  Coherency WDL  Content WDL  Delusions None reported or observed  Perception WDL  Hallucination None reported or observed  Judgment Impaired  Confusion None  Danger to Self  Current suicidal ideation? Denies

## 2024-11-26 NOTE — Plan of Care (Signed)

## 2024-11-26 NOTE — Group Note (Signed)
 Date:  11/26/2024 Time:  4:12 PM  Group Topic/Focus:  Fresh air Therapy with Music and Conversation    Participation Level:  Active  Participation Quality:  Appropriate  Affect:  Appropriate  Cognitive:  Appropriate  Insight: Appropriate  Engagement in Group:  Engaged  Modes of Intervention:  Socialization  Additional Comments:  none  Norleen SHAUNNA Bias 11/26/2024, 4:12 PM

## 2024-11-26 NOTE — Group Note (Signed)
 Date:  11/26/2024 Time:  10:54 AM  Group Topic/Focus:  Wellness Toolbox:   The focus of this group is to discuss various aspects of wellness, balancing those aspects and exploring ways to increase the ability to experience wellness.  Patients will create a wellness toolbox for use upon discharge.  I had the opportunity to go over the holiday and New Year activity sheets with each patient individually. The activity sheets provided patients with an opportunity to express personal memories, reflect on meaningful life experiences, and share favorite traditions, movies, and meals. Patients were also encouraged to discuss gentle New Year plans and goals, focusing on realistic and positive intentions for the upcoming year.  The activities supported cognitive engagement through word searches, memory recall, simple problem-solving, and reflection exercises. Patients were able to participate at their own pace, and assistance was provided as needed. The activity sheets also included a brief, gentle movement component, allowing patients to engage in light physical activity appropriate to their abilities.  Overall, the activity promoted emotional expression, social interaction, and mental stimulation in a calm and supportive manner. Patients appeared engaged and receptive, and the activity provided a meaningful opportunity for conversation, self-expression, and encouragement of overall well-being.  Participation Level:  Active  Participation Quality:  Appropriate  Affect:  Appropriate  Cognitive:  Appropriate  Insight: Appropriate and Improving  Engagement in Group:  Engaged  Modes of Intervention:  Activity and Discussion  Additional Comments:    Amol Domanski L Desjuan Stearns 11/26/2024, 10:54 AM

## 2024-11-27 NOTE — Group Note (Signed)
 Date:  11/27/2024 Time:  11:05 AM  Group Topic/Focus:  Coping With Mental Health Crisis:   The purpose of this group is to help patients identify strategies for coping with mental health crisis.  Group discusses possible causes of crisis and ways to manage them effectively.    Participation Level:  Did Not Attend    Norleen SHAUNNA Bias 11/27/2024, 11:05 AM

## 2024-11-27 NOTE — Progress Notes (Signed)
 [facility] MD Progress Note  11/27/2024 12:30 PM [name] Hinton  MRN:  [id number]  Patient is a 63 year old female with a history of Bipolar Disorder who presents voluntarily to [facility] for assessment. Patient presents via [facility] via [employer] with chief complaint of suicide attempt. [employer] was contacted by patient's mother after patient place a note on the bathroom door stating: Mom do not open this door!  Just call 911, and let them come in here.  You do not need to see me.  I am sorry!  This is what you need to tell the 911 operator: Tell them I told you not to come in.  I have slit my wrist and bled out.  I am dead (I hope!).    Patient endorses having suicidal thoughts for the past week and she confirms that today she locked herself in the bathroom with a knife and started to cut her wrist. Client also reported overdosing on 1400mg  of her heart medications a week ago.Patient is admitted to [facility] unit with Q15 min safety monitoring. Multidisciplinary team approach is offered. Medication management; group/milieu therapy is offered.   Subjective:  Chart reviewed, case discussed in multidisciplinary meeting, patient seen during rounds.  The patient reported sudden onset of significant dizziness while in the day room, accompanied by a racing heart. The patient endorsed a known history of arrhythmia and long QT syndrome and stated they are prescribed metoprolol  but had not taken it recently as they had not felt it was needed. The patient returned to their room to lie down. The patient denied any other physical complaints. The patient denied suicidal ideation, homicidal ideation, and auditory or visual hallucinations.  RN to obtain vital signs immediately; monitor for recurrence of dizziness or palpitations. Vitals all wnl. Will get EKG also.   Past Psychiatric History: see h&P Family History:  Family History  Problem Relation Age of Onset   Breast cancer Neg Hx    Social History:   Social History   Substance and Sexual Activity  Alcohol  Use No     Social History   Substance and Sexual Activity  Drug Use No    Social History   Socioeconomic History   Marital status: Divorced    Spouse name: Not on file   Number of children: Not on file   Years of education: Not on file   Highest education level: Not on file  Occupational History   Not on file  Tobacco Use   Smoking status: Some Days    Types: Cigarettes   Smokeless tobacco: Never   Tobacco comments:    03/07/2024 patient smokes about 4-5 cigarettes some days    02/09/2023 A pack last patient about a week  Substance and Sexual Activity   Alcohol  use: No   Drug use: No   Sexual activity: Yes    Birth control/protection: Surgical  Other Topics Concern   Not on file  Social History Narrative   Not on file   Social Drivers of Health   Tobacco Use: High Risk (11/08/2024)   Patient History    Smoking Tobacco Use: Some Days    Smokeless Tobacco Use: Never    Passive Exposure: Not on file  Financial Resource Strain: Not on File (09/25/2022)   Received from [employer]    Financial Resource Strain: 0  Food Insecurity: No Food Insecurity (11/08/2024)   Epic    Worried About Radiation Protection Practitioner of Food in the  Last Year: Never true    Ran Out of Food in the Last Year: Never true  Transportation Needs: No Transportation Needs (11/08/2024)   Epic    Lack of Transportation (Medical): No    Lack of Transportation (Non-Medical): No  Physical Activity: Not on File (03/20/2022)   Received from [facility]   Physical Activity    Physical Activity: 0  Stress: Not on File (03/20/2022)   Received from [facility]   Stress    Stress: 0  Social Connections: Not on File (08/10/2023)   Received from [facility]   Social Connections    Connectedness: 0  Depression (PHQ2-9): Not on file  Alcohol  Screen: Low Risk (11/07/2024)   Alcohol  Screen    Last Alcohol  Screening Score (AUDIT): 1  Housing: Low Risk  (11/08/2024)   Epic    Unable to Pay for Housing in the Last Year: No    Number of Times Moved in the Last Year: 0    Homeless in the Last Year: No  Utilities: Not At Risk (11/08/2024)   Epic    Threatened with loss of utilities: No  Health Literacy: Not on file   Past Medical History:  Past Medical History:  Diagnosis Date   Anxiety    Bipolar affective (HCC)    Chronic headaches    Depression    Hypercholesteremia    Hypertension    Restless leg syndrome     Past Surgical History:  Procedure Laterality Date   ABDOMINAL HYSTERECTOMY     OOPHORECTOMY     TONSILLECTOMY      Current Medications: Current Facility-Administered Medications  Medication Dose Route Frequency Provider Last Rate Last Admin   acetaminophen  (TYLENOL ) tablet 650 mg  650 mg Oral Q6H PRN McLauchlin, Angela, NP   650 mg at 11/26/24 1620   alum & mag hydroxide-simeth (MAALOX/MYLANTA) 200-200-20 MG/5ML suspension 30 mL  30 mL Oral Q4H PRN McLauchlin, Angela, NP   30 mL at 11/13/24 1457   artificial tears ophthalmic solution 1 drop  1 drop Both Eyes TID PRN Donnelly Mellow, MD       aspirin  EC tablet 81 mg  81 mg Oral QHS Jadapalle, Sree, MD   81 mg at 11/26/24 2205   clonazePAM  (KLONOPIN ) disintegrating tablet 0.25 mg  0.25 mg Oral Daily Nels Munn, MD   0.25 mg at 11/26/24 2205   haloperidol  (HALDOL ) tablet 5 mg  5 mg Oral TID PRN McLauchlin, Angela, NP       And   diphenhydrAMINE  (BENADRYL ) capsule 50 mg  50 mg Oral TID PRN McLauchlin, Angela, NP       haloperidol  lactate (HALDOL ) injection 5 mg  5 mg Intramuscular TID PRN McLauchlin, Angela, NP       And   diphenhydrAMINE  (BENADRYL ) injection 50 mg  50 mg Intramuscular TID PRN McLauchlin, Jon, NP       And   LORazepam  (ATIVAN ) injection 2 mg  2 mg Intramuscular TID PRN McLauchlin, Jon, NP       haloperidol  lactate (HALDOL ) injection 10 mg  10 mg Intramuscular TID PRN McLauchlin, Angela, NP       And   diphenhydrAMINE  (BENADRYL ) injection  50 mg  50 mg Intramuscular TID PRN McLauchlin, Angela, NP       And   LORazepam  (ATIVAN ) injection 2 mg  2 mg Intramuscular TID PRN McLauchlin, Angela, NP       gabapentin  (NEURONTIN ) capsule 300 mg  300 mg Oral TID Jadapalle, Sree, MD   300  mg at 11/27/24 0914   spironolactone  (ALDACTONE ) tablet 25 mg  25 mg Oral Daily Niels Kayla FALCON, RPH   25 mg at 11/27/24 9085   And   hydrochlorothiazide  (HYDRODIURIL ) tablet 25 mg  25 mg Oral Daily Greenwood, Howard F, RPH   25 mg at 11/27/24 9085   hydrOXYzine  (ATARAX ) tablet 25 mg  25 mg Oral TID PRN McLauchlin, Angela, NP   25 mg at 11/24/24 0951   irbesartan  (AVAPRO ) tablet 37.5 mg  37.5 mg Oral Daily Jadapalle, Sree, MD   37.5 mg at 11/27/24 0913   magnesium  hydroxide (MILK OF MAGNESIA) suspension 30 mL  30 mL Oral Daily PRN McLauchlin, Angela, NP       metoprolol  tartrate (LOPRESSOR ) tablet 25 mg  25 mg Oral BID PRN Jadapalle, Sree, MD   25 mg at 11/17/24 9380   multivitamin with minerals tablet 1 tablet  1 tablet Oral q morning Jadapalle, Sree, MD   1 tablet at 11/27/24 9085   naproxen  (NAPROSYN ) tablet 250 mg  250 mg Oral BID WC Jadapalle, Sree, MD   250 mg at 11/27/24 9087   nicotine  (NICODERM CQ  - dosed in mg/24 hours) patch 14 mg  14 mg Transdermal Daily Jadapalle, Sree, MD   14 mg at 11/27/24 9082   pantoprazole  (PROTONIX ) EC tablet 40 mg  40 mg Oral Daily PRN Jadapalle, Sree, MD       rOPINIRole  (REQUIP ) tablet 1 mg  1 mg Oral QHS Jadapalle, Sree, MD   1 mg at 11/26/24 2204   simvastatin  (ZOCOR ) tablet 20 mg  20 mg Oral QHS Jadapalle, Sree, MD   20 mg at 11/26/24 2205   traZODone  (DESYREL ) tablet 50 mg  50 mg Oral QHS PRN,MR X 1 [place], Ene A, NP   50 mg at 11/26/24 2204   venlafaxine  XR (EFFEXOR -XR) 24 hr capsule 150 mg  150 mg Oral Q breakfast Jadapalle, Sree, MD   150 mg at 11/27/24 9085    Lab Results:  No results found for this or any previous visit (from the past 48 hours).   Blood Alcohol  level:  Lab Results  Component Value  Date   [facility] <15 11/07/2024    Metabolic Disorder Labs: No results found for: HGBA1C, MPG Lab Results  Component Value Date   PROLACTIN 8.7 11/07/2024   Lab Results  Component Value Date   CHOL 174 03/07/2024   TRIG 62 03/07/2024   HDL 62 03/07/2024   CHOLHDL 2.8 03/07/2024   VLDL 28 11/18/2007   LDLCALC 100 (H) 03/07/2024   LDLCALC (H) 11/18/2007    183        Total Cholesterol/HDL:CHD Risk Coronary Heart Disease Risk Table                     Men   Women  1/2 Average Risk   3.4   3.3    Physical Findings: AIMS:  , ,  ,  ,    CIWA:    COWS:      Psychiatric Specialty Exam:  Presentation  General Appearance:  Appropriate for Environment; Casual  Eye Contact: Minimal  Speech: Normal Rate  Speech Volume: Decreased    Mood and Affect  Mood: Anxious Affect: Depressed; Flat   Thought Process  Thought Processes: Coherent  Orientation:Full (Time, Place and Person)  Thought Content:Logical  Hallucinations: Denies  Ideas of Reference:None  Suicidal Thoughts: Denies active SI  Homicidal Thoughts: Denies   Sensorium  Memory: Immediate Fair; Remote  Fair  Judgment: Fair  Insight: Fair    Chartered Certified Accountant: Fair  Attention Span: Fair  Recall: Dotti Abe of Knowledge: Fair  Language: Fair   Psychomotor Activity  Psychomotor Activity: No PMA or PMR  Musculoskeletal: Strength & Muscle Tone: within normal limits Gait & Station: normal Assets  Assets: Manufacturing Systems Engineer; Desire for Improvement    Physical Exam: Physical Exam Vitals and nursing note reviewed.    ROS Blood pressure 116/77, pulse 96, temperature 97.9 F (36.6 C), resp. rate 16, height 5' 2 (1.575 m), weight 51.3 kg, SpO2 100%. Body mass index is 20.67 kg/m.  Diagnosis: MDD   Clinical Decision Making: Patient is currently admitted after a suicide attempt by cutting herself in the context of worsening depression and grieving  after the loss of her father and boyfriend.  She will be monitored closely on the inpatient unit  Treatment Plan Summary:  Safety and Monitoring:             -- Voluntary admission to inpatient psychiatric unit for safety, stabilization and treatment             -- Daily contact with patient to assess and evaluate symptoms and progress in treatment             -- Patient's case to be discussed in multi-disciplinary team meeting             -- Observation Level: q15 minute checks             -- Vital signs:  q12 hours             -- Precautions: suicide, elopement, and assault   2. Psychiatric Diagnoses and Treatment:               cont. Klonopin  0. 25 mg nightly -patient showed significant improvement in her anxiety and mood  Effexor  XR  150 mg daily Reduced her gabapentin  to 300 mg 3 times daily -- The risks/benefits/side-effects/alternatives to this medication were discussed in detail with the patient and time was given for questions. The patient consents to medication trial.                -- Metabolic profile and EKG monitoring obtained while on an atypical antipsychotic (BMI: Lipid Panel: HbgA1c: QTc:)              -- Encouraged patient to participate in unit milieu and in scheduled group therapies                            3. Medical Issues Being Addressed:     Dizziness - vital signs - WNL - order EKG.  H/o long QT syndrome   4. Discharge Planning:   -- Social work and case management to assist with discharge planning and identification of hospital follow-up needs prior to discharge  -- Estimated LOS: 3-4 days  Desmond Chimera, MD 11/27/2024, 12:30 PM

## 2024-11-27 NOTE — Group Note (Signed)
 Date:  11/27/2024 Time:  9:57 PM  Group Topic/Focus:  Wrap-Up Group:   The focus of this group is to help patients review their daily goal of treatment and discuss progress on daily workbooks.    Participation Level:  Active  Participation Quality:  Appropriate  Affect:  Appropriate  Cognitive:  Alert  Insight: Appropriate  Engagement in Group:  Engaged  Modes of Intervention:  Discussion  Additional Comments:    [name] Hinton 11/27/2024, 9:57 PM

## 2024-11-27 NOTE — Progress Notes (Signed)
" °   11/26/24 2100  Psych Admission Type (Psych Patients Only)  Admission Status Voluntary  Psychosocial Assessment  Patient Complaints None  Eye Contact Fair  Facial Expression Animated  Affect Appropriate to circumstance  Speech Logical/coherent  Interaction Assertive  Motor Activity Slow  Appearance/Hygiene Unremarkable  Behavior Characteristics Cooperative  Mood Pleasant  Thought Process  Coherency WDL  Content WDL  Delusions None reported or observed  Perception WDL  Hallucination None reported or observed  Judgment Impaired  Confusion None  Danger to Self  Current suicidal ideation? Denies    "

## 2024-11-27 NOTE — Progress Notes (Signed)
 D- Patient alert and oriented x 4. Pt presents with a pleasant mood and affect. Denies SI, HI, AVH, and pain. Pt endorses goal is to be discharged tomorrow.   A- Scheduled medications administered to patient, per MD orders. Support and encouragement provided.  Routine safety checks conducted every 15 minutes.  Patient informed to notify staff with problems or concerns.  R- No adverse drug reactions noted. Patient contracts for safety at this time. Patient compliant with medications and treatment plan. Patient receptive, calm, and cooperative. Patient interacts well with others on the unit.  Patient remains safe at this time.    Ethelle Ola S.,RN

## 2024-11-27 NOTE — Group Note (Signed)
 Date:  11/27/2024 Time:  3:57 PM  Group Topic/Focus:  Karaoke Group: The purpose of this group is for patients to interact with each other while singing their favorite songs.     Participation Level:  Active  Participation Quality:  Appropriate  Affect:  Appropriate  Cognitive:  Appropriate  Insight: Appropriate  Engagement in Group:  Engaged  Modes of Intervention:  Activity  Additional Comments:    [name] Hinton 11/27/2024, 3:57 PM

## 2024-11-27 NOTE — Plan of Care (Signed)
" °  Problem: Education: Goal: Ability to state activities that reduce stress will improve 11/27/2024 0554 by Ezzard Stank, RN Outcome: Progressing 11/27/2024 0554 by Ezzard Stank, RN Outcome: Progressing   Problem: Coping: Goal: Ability to identify and develop effective coping behavior will improve 11/27/2024 0554 by Ezzard Stank, RN Outcome: Progressing 11/27/2024 0554 by Ezzard Stank, RN Outcome: Progressing   Problem: Self-Concept: Goal: Ability to identify factors that promote anxiety will improve Outcome: Progressing Goal: Level of anxiety will decrease Outcome: Progressing Goal: Ability to modify response to factors that promote anxiety will improve Outcome: Progressing   "

## 2024-11-27 NOTE — Plan of Care (Signed)
" °  Problem: Coping: Goal: Ability to identify and develop effective coping behavior will improve Outcome: Progressing   Problem: Self-Concept: Goal: Ability to identify factors that promote anxiety will improve Outcome: Progressing Goal: Level of anxiety will decrease Outcome: Progressing   Problem: Education: Goal: Emotional status will improve Outcome: Progressing   "

## 2024-11-28 MED ORDER — VENLAFAXINE HCL ER 150 MG PO CP24: 150.0000 mg | ORAL_CAPSULE | Freq: Every day | ORAL | 1 refills | Status: AC

## 2024-11-28 MED ORDER — NICOTINE 14 MG/24HR TD PT24: 14.0000 mg | MEDICATED_PATCH | Freq: Every day | TRANSDERMAL | 0 refills | Status: AC

## 2024-11-28 MED ORDER — CLONAZEPAM 0.25 MG PO TBDP: 0.2500 mg | ORAL_TABLET | Freq: Every day | ORAL | 0 refills | Status: AC

## 2024-11-28 NOTE — Group Note (Signed)
 Date:  11/28/2024 Time:  10:43 AM  Group Topic/Focus:    For geriatric mental health patients, effective exercises combine gentle movement, mind-body practices, and social engagement, focusing on activities like walking in nature, yoga, Tai Chi, water aerobics, and light strength training, which boost mood, reduce anxiety, improve sleep, and fight cognitive decline by increasing blood flow and endorphins, while also providing structure and purpose. Small amounts of daily activity (even 10 minutes) help, with a mix of aerobic, balance, and strength exercises recommended for overall well-being.    Participation Level:  Active  Participation Quality:  Appropriate  Affect:  Appropriate  Cognitive:  Appropriate  Insight: Appropriate  Engagement in Group:  Engaged  Modes of Intervention:  Activity  Additional Comments:  N/A  [name] Hinton Gavel 11/28/2024, 10:43 AM

## 2024-11-28 NOTE — Progress Notes (Signed)
 Discharge Note:  Patient denies SI/HI/AVH at this time. Discharge instructions, AVS, prescriptions, and transition recor gone over with patient. Patient agrees to comply with medication management, follow-up visit, and outpatient therapy. Patient belongings returned to patient. Patient questions and concerns addressed and answered. Patient ambulatory off unit with tech at side. Patient discharged to safe transport.  Coston Mandato S.,RN

## 2024-11-28 NOTE — Plan of Care (Signed)
  Problem: Education: Goal: Ability to state activities that reduce stress will improve Outcome: Adequate for Discharge   Problem: Coping: Goal: Ability to identify and develop effective coping behavior will improve Outcome: Adequate for Discharge   Problem: Self-Concept: Goal: Ability to identify factors that promote anxiety will improve Outcome: Adequate for Discharge Goal: Level of anxiety will decrease Outcome: Adequate for Discharge Goal: Ability to modify response to factors that promote anxiety will improve Outcome: Adequate for Discharge   Problem: Education: Goal: Knowledge of [place] [facility] Education information/materials will improve Outcome: Adequate for Discharge Goal: Emotional status will improve Outcome: Adequate for Discharge Goal: Mental status will improve Outcome: Adequate for Discharge Goal: Verbalization of understanding the information provided will improve Outcome: Adequate for Discharge   Problem: Activity: Goal: Interest or engagement in activities will improve Outcome: Adequate for Discharge Goal: Sleeping patterns will improve Outcome: Adequate for Discharge   Problem: Coping: Goal: Ability to verbalize frustrations and anger appropriately will improve Outcome: Adequate for Discharge Goal: Ability to demonstrate self-control will improve Outcome: Adequate for Discharge   Problem: Health Behavior/Discharge Planning: Goal: Identification of resources available to assist in meeting health care needs will improve Outcome: Adequate for Discharge Goal: Compliance with treatment plan for underlying cause of condition will improve Outcome: Adequate for Discharge   Problem: Physical Regulation: Goal: Ability to maintain clinical measurements within normal limits will improve Outcome: Adequate for Discharge   Problem: Safety: Goal: Periods of time without injury will increase Outcome: Adequate for Discharge   Problem: Education: Goal:  Utilization of techniques to improve thought processes will improve Outcome: Adequate for Discharge Goal: Knowledge of the prescribed therapeutic regimen will improve Outcome: Adequate for Discharge   Problem: Activity: Goal: Interest or engagement in leisure activities will improve Outcome: Adequate for Discharge Goal: Imbalance in normal sleep/wake cycle will improve Outcome: Adequate for Discharge   Problem: Coping: Goal: Coping ability will improve Outcome: Adequate for Discharge Goal: Will verbalize feelings Outcome: Adequate for Discharge   Problem: Health Behavior/Discharge Planning: Goal: Ability to make decisions will improve Outcome: Adequate for Discharge Goal: Compliance with therapeutic regimen will improve Outcome: Adequate for Discharge   Problem: Role Relationship: Goal: Will demonstrate positive changes in social behaviors and relationships Outcome: Adequate for Discharge   Problem: Safety: Goal: Ability to disclose and discuss suicidal ideas will improve Outcome: Adequate for Discharge Goal: Ability to identify and utilize support systems that promote safety will improve Outcome: Adequate for Discharge   Problem: Self-Concept: Goal: Will verbalize positive feelings about self Outcome: Adequate for Discharge Goal: Level of anxiety will decrease Outcome: Adequate for Discharge

## 2024-11-28 NOTE — [facility] Suicide Risk Assessment (Signed)
 [facility] INPATIENT:  Family/Significant Other Suicide Prevention Education  Suicide Prevention Education:   SPE completed with pt, as pt refused to consent to family contact. SPI pamphlet provided to pt and pt was encouraged to share information with support network, ask questions, and talk about any concerns relating to SPE. Pt denies access to guns/firearms and verbalized understanding of information provided. Mobile Crisis information also provided to pt.   [name] Hinton 11/28/2024, 11:23 AM

## 2024-11-28 NOTE — [facility] Suicide Risk Assessment (Addendum)
 [facility] Discharge Suicide Risk Assessment   Principal Problem: Bipolar 1 disorder (HCC) Discharge Diagnoses: MDD   Total Time spent with patient: 30 minutes  Musculoskeletal: Strength & Muscle Tone: within normal limits Gait & Station: normal Patient leans: N/A  Psychiatric Specialty Exam  Presentation  General Appearance:  Appropriate for Environment; Casual  Eye Contact: Minimal  Speech: Normal Rate  Speech Volume: Decreased  Handedness: Right   Mood and Affect  Mood: Anxious; Depressed  Duration of Depression Symptoms: Greater than two weeks  Affect: Depressed; Flat   Thought Process  Thought Processes: Coherent  Descriptions of Associations:Intact  Orientation:Full (Time, Place and Person)  Thought Content:Logical  History of Schizophrenia/Schizoaffective disorder:No  Duration of Psychotic Symptoms:No data recorded Hallucinations:No data recorded Ideas of Reference:None  Suicidal Thoughts:No data recorded Homicidal Thoughts:No data recorded  Sensorium  Memory: Immediate Fair; Remote Fair  Judgment: Impaired  Insight: Shallow   Executive Functions  Concentration: Fair  Attention Span: Fair  Recall: [employer] of Knowledge: Fair  Language: Fair   Psychomotor Activity  Psychomotor Activity:No data recorded  Assets  Assets: Communication Skills; Desire for Improvement   Sleep  Sleep:No data recorded Estimated Sleeping Duration (Last 24 Hours): 6.00-8.00 hours  Physical Exam: Physical Exam ROS Blood pressure 103/72, pulse 80, temperature 97.9 F (36.6 C), resp. rate 19, height 5' 2 (1.575 m), weight 51.3 kg, SpO2 99%. Body mass index is 20.67 kg/m.  Mental Status Per Nursing Assessment::   On Admission:     Demographic Factors:  Caucasian  Loss Factors: Financial problems/change in socioeconomic status  Historical Factors: Prior suicide attempts and Family history of mental illness or substance  abuse  Risk Reduction Factors:   Religious beliefs about death  Continued Clinical Symptoms:  Severe Anxiety and/or Agitation Medical Diagnoses and Treatments/Surgeries  Cognitive Features That Contribute To Risk:  None    Suicide Risk:  Minimal: No identifiable suicidal ideation.  Patients presenting with no risk factors but with morbid ruminations; may be classified as minimal risk based on the severity of the depressive symptoms    Plan Of Care/Follow-up recommendations:  Follow up recommendations: # It is recommended to the patient to continue psychiatric medications as prescribed, after discharge from the hospital.   # It is recommended to the patient to follow up with your outpatient psychiatric provider and PCP. # It was discussed with the patient, the impact of alcohol , drugs, tobacco have been there overall psychiatric and medical wellbeing, and total abstinence from substance use was recommended. # Prescriptions provided or sent directly to preferred pharmacy at discharge. Patient agreeable to plan. Given the opportunity to ask questions. Appears to feel comfortable with discharge.  # In the event of worsening symptoms, the patient is instructed to call the crisis hotline (988), 911 and or go to the nearest ED for appropriate evaluation and treatment of symptoms. To follow-up with primary care provider for other medical issues, concerns and or health care needs   Desmond Chimera, MD 11/28/2024, 10:53 AM

## 2024-11-28 NOTE — Progress Notes (Signed)
 D- Patient alert and oriented x 4. Pt presents with a pleasant mood and affect. Pt states she is excited about being discharged in time for her birthday tomorrow. Denies SI, HI, AVH, and pain. Pt endorsing anxiety 4/10. Pt states she slept well. Pt expected to discharge today.  A- Scheduled medications administered to patient, per MD orders. Support and encouragement provided.  Routine safety checks conducted every 15 minutes.  Patient informed to notify staff with problems or concerns.  R- No adverse drug reactions noted. Patient contracts for safety at this time. Patient compliant with medications and treatment plan. Patient receptive, calm, and cooperative. Patient interacts well with others on the unit.  Patient remains safe at this time.    Enyah Moman S.,RN

## 2024-11-28 NOTE — Group Note (Signed)
 Physical/Occupational Therapy Group Note  Group Topic: Yoga  Group Date: 11/28/2024 Start Time: 1305 End Time: 1333 Facilitators: Mosetta Ferdinand, Alm Hamilton, PT   Group Description: Group participated with series of yoga poses, designed to emphasize functional sitting balance, core stability, generalized flexibility and overall posture.  Incorporated deep breathing techniques with poses, working to promote relaxation, mindfulness and focus with targeted activities.  Discussed benefits of yoga in improving mood and self-esteem, reducing stress and anxiety, and promoting functional strength, balance and core stability for each participant.  Discussed ways to integrate into each participants daily routine.  Provided handout with written and pictorial descriptions of included yoga movements to be utilized as appropriate outside of group time.  Therapeutic Goal(s):  Demonstrate safe ability to participate with yoga poses during group activity. Identify one benefit of participation with yoga poses as part of each participants exercise/movement routine. Identify 1-2 individual poses that participant feels most beneficial to his/her needs and that he/she can easily replicate outside of group.  Individual Participation: Pt actively participated with the discussion and physical activity components of the session and was able with min cuing to modify poses as needed for patient comfort  Participation Level: Active and Engaged   Participation Quality: Minimal Cues   Behavior: Appropriate   Speech/Thought Process: Coherent and Focused   Affect/Mood: Appropriate   Insight: Good   Judgement: Good   Modes of Intervention: Activity, Discussion, and Education  Patient Response to Interventions:  Attentive, Engaged, Interested , and Receptive   Plan: Continue to engage patient in PT/OT groups 1 - 2x/week.  CHARM Hamilton Bertin PT, DPT 11/28/2024, 1:45 PM

## 2024-11-28 NOTE — Plan of Care (Signed)
  Problem: Education: Goal: Ability to state activities that reduce stress will improve Outcome: Progressing   Problem: Coping: Goal: Ability to identify and develop effective coping behavior will improve Outcome: Progressing   Problem: Self-Concept: Goal: Ability to identify factors that promote anxiety will improve Outcome: Progressing Goal: Level of anxiety will decrease Outcome: Progressing Goal: Ability to modify response to factors that promote anxiety will improve Outcome: Progressing   Problem: Education: Goal: Knowledge of [facility] Education information/materials will improve Outcome: Progressing Goal: Emotional status will improve Outcome: Progressing Goal: Mental status will improve Outcome: Progressing Goal: Verbalization of understanding the information provided will improve Outcome: Progressing   Problem: Activity: Goal: Interest or engagement in activities will improve Outcome: Progressing Goal: Sleeping patterns will improve Outcome: Progressing   Problem: Coping: Goal: Ability to verbalize frustrations and anger appropriately will improve Outcome: Progressing Goal: Ability to demonstrate self-control will improve Outcome: Progressing   Problem: Health Behavior/Discharge Planning: Goal: Identification of resources available to assist in meeting health care needs will improve Outcome: Progressing Goal: Compliance with treatment plan for underlying cause of condition will improve Outcome: Progressing   Problem: Physical Regulation: Goal: Ability to maintain clinical measurements within normal limits will improve Outcome: Progressing   Problem: Safety: Goal: Periods of time without injury will increase Outcome: Progressing   Problem: Education: Goal: Utilization of techniques to improve thought processes will improve Outcome: Progressing Goal: Knowledge of the prescribed therapeutic regimen will improve Outcome: Progressing   Problem:  Activity: Goal: Interest or engagement in leisure activities will improve Outcome: Progressing Goal: Imbalance in normal sleep/wake cycle will improve Outcome: Progressing   Problem: Coping: Goal: Coping ability will improve Outcome: Progressing Goal: Will verbalize feelings Outcome: Progressing   Problem: Health Behavior/Discharge Planning: Goal: Ability to make decisions will improve Outcome: Progressing Goal: Compliance with therapeutic regimen will improve Outcome: Progressing   Problem: Role Relationship: Goal: Will demonstrate positive changes in social behaviors and relationships Outcome: Progressing   Problem: Safety: Goal: Ability to disclose and discuss suicidal ideas will improve Outcome: Progressing Goal: Ability to identify and utilize support systems that promote safety will improve Outcome: Progressing   Problem: Self-Concept: Goal: Will verbalize positive feelings about self Outcome: Progressing Goal: Level of anxiety will decrease Outcome: Progressing   

## 2024-11-28 NOTE — Discharge Summary (Signed)
 " Physician Discharge Summary Note  Patient:  [name] Hinton is an 63 y.o., female MRN:  [id number] DOB:  1961/01/18 Patient phone:  [phone] (home)  Patient address:   [address] [place] [address],  Total Time spent with patient: 30 minutes  Date of Admission:  11/08/2024 Date of Discharge: 11/28/2024  Reason for Admission:  Suicidal ideation   Principal Problem: Bipolar 1 disorder (HCC) Discharge Diagnoses: MDD  Past Psychiatric History:  Information collected from patient/chart   Prev Dx/Sx: Depression and anxiety Current Psych Provider: None reported Home Meds (current): Vraylar , Klonopin  that she stopped 2 months ago Previous Med Trials: Unknown Therapy: Denies   Prior Psych Hospitalization: 15 years ago at [facility] Prior Self Harm: Last week by overdose and total of 3 suicide attempts Prior Violence: Denies   Family Psych History: Dad with depression Family Hx suicide: Denies   Social History:  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 Legal Hx: Denies Living Situation: With mom, divorced and no kids Spiritual Hx: Denies Access to weapons/lethal means: Denies   Substance History Alcohol : Denies   Illicit drugs: Denies Prescription drug abuse: Denies Rehab hx: Denies  Past Medical History:  Past Medical History:  Diagnosis Date   Anxiety    Bipolar affective (HCC)    Chronic headaches    Depression    Hypercholesteremia    Hypertension    Restless leg syndrome     Past Surgical History:  Procedure Laterality Date   ABDOMINAL HYSTERECTOMY     OOPHORECTOMY     TONSILLECTOMY     Family History:  Family History  Problem Relation Age of Onset   Breast cancer Neg Hx     Social History:  Social History   Substance and Sexual Activity  Alcohol  Use No     Social History   Substance and Sexual Activity  Drug Use No    Social History   Socioeconomic History   Marital status: Divorced    Spouse name: Not on  file   Number of children: Not on file   Years of education: Not on file   Highest education level: Not on file  Occupational History   Not on file  Tobacco Use   Smoking status: Some Days    Types: Cigarettes   Smokeless tobacco: Never   Tobacco comments:    03/07/2024 patient smokes about 4-5 cigarettes some days    02/09/2023 A pack last patient about a week  Substance and Sexual Activity   Alcohol  use: No   Drug use: No   Sexual activity: Yes    Birth control/protection: Surgical  Other Topics Concern   Not on file  Social History Narrative   Not on file   Social Drivers of Health   Tobacco Use: High Risk (11/08/2024)   Patient History    Smoking Tobacco Use: Some Days    Smokeless Tobacco Use: Never    Passive Exposure: Not on file  Financial Resource Strain: Not on File (09/25/2022)   Received from [facility]    Financial Resource Strain: 0  Food Insecurity: No Food Insecurity (11/08/2024)   Epic    Worried About Programme Researcher, Broadcasting/film/video in the Last Year: Never true    Ran Out of Food in the Last Year: Never true  Transportation Needs: No Transportation Needs (11/08/2024)   Epic    Lack of Transportation (Medical): No    Lack of Transportation (Non-Medical): No  Physical Activity:  Not on File (03/20/2022)   [facility]   Physical Activity    Physical Activity: 0  Stress: Not on File (03/20/2022)   Received from [facility]   Stress    Stress: 0  Social Connections: Not on File (08/10/2023)   Received from [facility]   Social Connections    Connectedness: 0  Depression (PHQ2-9): Not on file  Alcohol  Screen: Low Risk (11/07/2024)   Alcohol  Screen    Last Alcohol  Screening Score (AUDIT): 1  Housing: Low Risk (11/08/2024)   Epic    Unable to Pay for Housing in the Last Year: No    Number of Times Moved in the Last Year: 0    Homeless in the Last Year: No  Utilities: Not At Risk (11/08/2024)   Epic    Threatened with loss of utilities: No   Health Literacy: Not on file    Hospital Course:   The patient is a 63 year old female with a history of mood and anxiety symptoms who [facility] after a suicide attempt by wrist cutting, preceded by a recent intentional overdose of metoprolol  and trazodone . She reported worsening depressive symptoms over several months in the context of complicated grief following the deaths of her father and boyfriend, along with significant psychosocial stressors. She endorsed persistent depressed mood, anhedonia, poor sleep, low energy, hopelessness, guilt, and suicidal ideation with intent. She was admitted to the geriatric psychiatry unit for safety and stabilization and placed on Q15-minute observations with suicide, elopement, and assault precautions.  During hospitalization, the patient received a multidisciplinary treatment approach, including daily psychiatric evaluation, medication management, group and milieu therapy, and social work involvement. Her clinical picture was most consistent with Major Depressive Disorder, and cariprazine  (Vraylar ) was discontinued due to lack of clear benefit. Antidepressant treatment was initiated and titrated with venlafaxine  XR, which was gradually increased to a therapeutic dose. Clonazepam  was used cautiously for anxiety and later reduced due to dizziness, sedation, and orthostatic symptoms. Gabapentin  was adjusted for anxiety management and tolerability. The patient was largely medication compliant.  Medically, her course was notable for intermittent dizziness and one fall without injury; head imaging was unremarkable. She experienced episodic palpitations and dizziness in the setting of a history of arrhythmia and long QT syndrome; vital signs and EKG monitoring showed no acute abnormalities, and contributing medications were adjusted.  Psychiatrically, the patient demonstrated gradual improvement in depressive and anxiety  symptoms, with resolution of active suicidal ideation. She engaged appropriately in groups, was able to verbalize coping strategies, and demonstrated improved insight and judgment over time. Her hospitalization was complicated by housing instability after her mother declined to have her return home, contributing to ongoing distress, particularly during the holiday period. Social work made extensive efforts to identify shelter, sober living, and community resource options, and the patient ultimately expressed willingness to utilize shelters and outpatient supports.  By the latter part of hospitalization, the patient consistently denied suicidal or homicidal ideation, denied hallucinations, and was future-oriented. She articulated a realistic plan for outpatient psychiatric follow-up, medication adherence, and use of community and shelter resources. She was deemed psychiatrically stable for discharge, though she remains at chronic moderate risk given her history and ongoing psychosocial stressors.   Physical Findings: AIMS:  , ,  ,  ,  ,  ,   CIWA:    COWS:     Musculoskeletal: Strength & Muscle Tone: within normal limits Gait & Station: normal Patient leans: N/A   Psychiatric Specialty  Exam:  Presentation  General Appearance:  Appropriate for Environment  Eye Contact: Fair  Speech: Clear and Coherent  Speech Volume: Normal  Handedness: Right   Mood and Affect  Mood: Euphoric  Affect: Congruent   Thought Process  Thought Processes: Coherent  Descriptions of Associations:Intact  Orientation:Full (Time, Place and Person)  Thought Content:Logical  History of Schizophrenia/Schizoaffective disorder:No  Duration of Psychotic Symptoms:No data recorded Hallucinations:Hallucinations: None  Ideas of Reference:None  Suicidal Thoughts:Suicidal Thoughts: No  Homicidal Thoughts:Homicidal Thoughts: No   Sensorium  Memory: Immediate Good; Recent Good; Remote  Fair  Judgment: Fair  Insight: Fair   Art Therapist  Concentration: Fair  Attention Span: Fair  Recall: [employer] of Knowledge: Fair  Language: Fair   Psychomotor Activity  Psychomotor Activity: Psychomotor Activity: Normal   Assets  Assets: Communication Skills; Desire for Improvement; Financial Resources/Insurance; Physical Health; Social Support   Sleep  Sleep: Sleep: Fair  Estimated Sleeping Duration (Last 24 Hours): 6.00-8.00 hours   Physical Exam: Physical Exam ROS Blood pressure 103/72, pulse 80, temperature 97.9 F (36.6 C), resp. rate 19, height 5' 2 (1.575 m), weight 51.3 kg, SpO2 99%. Body mass index is 20.67 kg/m.   Tobacco Use History[1] Tobacco Cessation:  A prescription for an FDA-approved tobacco cessation medication provided at discharge   Blood Alcohol  level:  Lab Results  Component Value Date   [facility] <15 11/07/2024    Metabolic Disorder Labs:  No results found for: HGBA1C, MPG Lab Results  Component Value Date   PROLACTIN 8.7 11/07/2024   Lab Results  Component Value Date   CHOL 174 03/07/2024   TRIG 62 03/07/2024   HDL 62 03/07/2024   CHOLHDL 2.8 03/07/2024   VLDL 28 11/18/2007   LDLCALC 100 (H) 03/07/2024   LDLCALC (H) 11/18/2007    183        Total Cholesterol/HDL:CHD Risk Coronary Heart Disease Risk Table                     Men   Women  1/2 Average Risk   3.4   3.3    See Psychiatric Specialty Exam and Suicide Risk Assessment completed by Attending Physician prior to discharge.  Discharge destination:  Home  Is patient on multiple antipsychotic therapies at discharge:  No   Has Patient had three or more failed trials of antipsychotic monotherapy by history:  No  Recommended Plan for Multiple Antipsychotic Therapies: NA   Allergies as of 11/28/2024       Reactions   Quetiapine  Other (See Comments)   Causes a lot of lethargy the following morning   Vancomycin  Itching, Other (See  Comments)   All-over, severe itching        Medication List     STOP taking these medications    buPROPion  150 MG 24 hr tablet Commonly known as: WELLBUTRIN  XL   Calcium  Carb-Cholecalciferol 600-5 MG-MCG Tabs   clonazePAM  0.5 MG tablet Commonly known as: KLONOPIN  Replaced by: clonazePAM  0.25 MG disintegrating tablet   GOODY HEADACHE PO   ibuprofen 200 MG tablet Commonly known as: ADVIL   multivitamin with minerals Tabs tablet   Vraylar  1.5 MG capsule Generic drug: cariprazine        TAKE these medications      Indication  rOPINIRole  1 MG tablet Commonly known as: REQUIP  Take 1 mg by mouth at bedtime. The timing of this medication is very important.    Artificial Tears PF 0.1-0.3 % Soln Generic drug: Dextran 70-Hypromellose (  PF) Place 1 drop into both eyes 3 (three) times daily as needed (for dryness).    aspirin  EC 81 MG tablet Take 81 mg by mouth at bedtime. Swallow whole.    clonazePAM  0.25 MG disintegrating tablet Commonly known as: KLONOPIN  Take 1 tablet (0.25 mg total) by mouth daily. Start taking on: November 29, 2024 Replaces: clonazePAM  0.5 MG tablet    gabapentin  300 MG capsule Commonly known as: NEURONTIN  TAKE 1 CAPSULE BY MOUTH THREE TIMES A DAY    metoprolol  tartrate 50 MG tablet Commonly known as: LOPRESSOR  TAKE 1 TABLET BY MOUTH 2 TIMES DAILY AS NEEDED (MAY TAKE 50MG  1-2 TIMES DAILY FOR IRREGULAR HEART BEAT).    naproxen  sodium 220 MG tablet Commonly known as: ALEVE  Take 220-440 mg by mouth 2 (two) times daily as needed (for mild pain or headaches).    nicotine  14 mg/24hr patch Commonly known as: NICODERM CQ  - dosed in mg/24 hours Place 1 patch (14 mg total) onto the skin daily. Start taking on: November 29, 2024    omeprazole 20 MG tablet Commonly known as: PRILOSEC OTC Take 20 mg by mouth daily as needed (for reflux).    simvastatin  20 MG tablet Commonly known as: ZOCOR  Take 20 mg by mouth at bedtime.     spironolactone -hydrochlorothiazide  25-25 MG tablet Commonly known as: Aldactazide Take 1 tablet by mouth daily.    Tylenol  8 Hour 650 MG CR tablet Generic drug: acetaminophen  Take 650-1,300 mg by mouth every 8 (eight) hours as needed for pain (or headaches).    valsartan 320 MG tablet Commonly known as: DIOVAN Take 320 mg by mouth daily.    venlafaxine  XR 150 MG 24 hr capsule Commonly known as: EFFEXOR -XR Take 1 capsule (150 mg total) by mouth daily with breakfast. Start taking on: November 29, 2024          Follow-up recommendations:    Follow up recommendations: # It is recommended to the patient to continue psychiatric medications as prescribed, after discharge from the hospital.   # It is recommended to the patient to follow up with your outpatient psychiatric provider and PCP. # It was discussed with the patient, the impact of alcohol , drugs, tobacco have been there overall psychiatric and medical wellbeing, and total abstinence from substance use was recommended. # Prescriptions provided or sent directly to preferred pharmacy at discharge. Patient agreeable to plan. Given the opportunity to ask questions. Appears to feel comfortable with discharge.  # In the event of worsening symptoms, the patient is instructed to call the crisis hotline (988), 911 and or go to the nearest ED for appropriate evaluation and treatment of symptoms. To follow-up with primary care provider for other medical issues, concerns and or health care needs   Signed: Desmond Chimera, MD 11/28/2024, 11:04 AM           [1]  Social History Tobacco Use  Smoking Status Some Days   Types: Cigarettes  Smokeless Tobacco Never  Tobacco Comments   03/07/2024 patient smokes about 4-5 cigarettes some days   02/09/2023 A pack last patient about a week   "

## 2024-11-28 NOTE — Progress Notes (Signed)
" °   11/27/24 2100  Psych Admission Type (Psych Patients Only)  Admission Status Voluntary  Psychosocial Assessment  Patient Complaints None  Eye Contact Fair  Facial Expression Animated  Affect Appropriate to circumstance  Speech Logical/coherent  Interaction Assertive  Motor Activity Other (Comment) (steady)  Appearance/Hygiene Unremarkable  Behavior Characteristics Cooperative;Appropriate to situation  Mood Pleasant  Thought Process  Coherency WDL  Content WDL  Delusions None reported or observed  Perception WDL  Hallucination None reported or observed  Judgment WDL  Confusion None  Danger to Self  Current suicidal ideation? Denies  Self-Injurious Behavior No self-injurious ideation or behavior indicators observed or expressed   Agreement Not to Harm Self Yes  Description of Agreement verbal  Danger to Others  Danger to Others None reported or observed   Mood/Behavior:  Bright. Pleasant and cooperative.    Psych assessment: Denies SI/HI and AVH.     Interaction / Group attendance:  Present in the milieu. Interacting with peers and staff.  Attended group.   Medication/ PRNs: Compliant with scheduled medications. Required PRNs Tylenol  for headache and Trazodone  for sleep and noted effective.   Pain: Headache  15 min checks in place for safety. "

## 2024-11-28 NOTE — BHH Counselor (Signed)
 CSW contacted [name] Hinton [phone]). CSW confirmed pt's discharge plan with pt's mother.   Pt's mother confirmed that she WILL assist pt with getting to the homeless shelter after pt comes to her home and picks up some belongings.   CSW informed care team.   Lum Croft, MSW, [facility] 11/28/2024 11:22 AM

## 2024-11-28 NOTE — BHH Counselor (Signed)
 CSW contacted pt's mother Dickey Ramus to confirm discharge plan. CSW unable to reach, unable to LVM requesting return call.   Lum Croft, MSW, [place] 11/28/2024 11:18 AM

## 2024-11-28 NOTE — Progress Notes (Signed)
" °  [facility] Adult Case Management Discharge Plan :  Will you be returning to the same living situation after discharge:  Yes,  pt will return home, pt reports her mother will drop her off at [facility] after assisting her with some errands.  At discharge, do you have transportation home?: Yes,  CSW will assist with transportation  Do you have the ability to pay for your medications: Yes,  [facility] [place]  Release of information consent forms completed and in the chart;  Patient's signature needed at discharge.  Patient to Follow up at:  Follow-up Information     [facility] Follow up on 12/05/2024.   Why: Your appointment is scheduled for 12/05/24 at 2pm and it will be a virtual appointment. Contact information: [address]  [address] [place] [address] [phone]                 Next level of care provider has access to [facility] Link:no  Safety Planning and Suicide Prevention discussed: Yes,  SPE gone over with pt at discharge      Has patient been referred to the Quitline?: Patient refused referral for treatment  Patient has been referred for addiction treatment: No known substance use disorder.  [address] LCSWA 11/28/2024, 11:12 AM "

## 2024-12-07 ENCOUNTER — Other Ambulatory Visit: Payer: Self-pay

## 2024-12-07 ENCOUNTER — Observation Stay (HOSPITAL_BASED_OUTPATIENT_CLINIC_OR_DEPARTMENT_OTHER)
Admission: EM | Admit: 2024-12-07 | Discharge: 2024-12-09 | Disposition: A | Discharge status: Home / Self Care | Attending: Internal Medicine | Admitting: Internal Medicine

## 2024-12-07 ENCOUNTER — Emergency Department (HOSPITAL_BASED_OUTPATIENT_CLINIC_OR_DEPARTMENT_OTHER): Admitting: Radiology

## 2024-12-07 DIAGNOSIS — R7989 Other specified abnormal findings of blood chemistry: Secondary | ICD-10-CM | POA: Diagnosis not present

## 2024-12-07 DIAGNOSIS — Z79899 Other long term (current) drug therapy: Secondary | ICD-10-CM | POA: Insufficient documentation

## 2024-12-07 DIAGNOSIS — R0781 Pleurodynia: Secondary | ICD-10-CM | POA: Diagnosis present

## 2024-12-07 DIAGNOSIS — F1410 Cocaine abuse, uncomplicated: Secondary | ICD-10-CM | POA: Insufficient documentation

## 2024-12-07 DIAGNOSIS — I2699 Other pulmonary embolism without acute cor pulmonale: Secondary | ICD-10-CM | POA: Insufficient documentation

## 2024-12-07 DIAGNOSIS — G2581 Restless legs syndrome: Secondary | ICD-10-CM | POA: Diagnosis present

## 2024-12-07 DIAGNOSIS — F3130 Bipolar disorder, current episode depressed, mild or moderate severity, unspecified: Secondary | ICD-10-CM | POA: Diagnosis present

## 2024-12-07 DIAGNOSIS — N179 Acute kidney failure, unspecified: Secondary | ICD-10-CM | POA: Diagnosis not present

## 2024-12-07 DIAGNOSIS — F17210 Nicotine dependence, cigarettes, uncomplicated: Secondary | ICD-10-CM | POA: Diagnosis not present

## 2024-12-07 DIAGNOSIS — E785 Hyperlipidemia, unspecified: Secondary | ICD-10-CM | POA: Diagnosis present

## 2024-12-07 DIAGNOSIS — F418 Other specified anxiety disorders: Secondary | ICD-10-CM | POA: Diagnosis not present

## 2024-12-07 DIAGNOSIS — F319 Bipolar disorder, unspecified: Secondary | ICD-10-CM | POA: Insufficient documentation

## 2024-12-07 DIAGNOSIS — R079 Chest pain, unspecified: Secondary | ICD-10-CM

## 2024-12-07 DIAGNOSIS — I959 Hypotension, unspecified: Secondary | ICD-10-CM | POA: Insufficient documentation

## 2024-12-07 DIAGNOSIS — Z720 Tobacco use: Secondary | ICD-10-CM | POA: Diagnosis present

## 2024-12-07 DIAGNOSIS — I10 Essential (primary) hypertension: Secondary | ICD-10-CM | POA: Diagnosis present

## 2024-12-07 LAB — CBC
HCT: 35.8 % — ABNORMAL LOW (ref 36.0–46.0)
Hemoglobin: 12.2 g/dL (ref 12.0–15.0)
MCH: 31 pg (ref 26.0–34.0)
MCHC: 34.1 g/dL (ref 30.0–36.0)
MCV: 91.1 fL (ref 80.0–100.0)
Platelets: 423 K/uL — ABNORMAL HIGH (ref 150–400)
RBC: 3.93 MIL/uL (ref 3.87–5.11)
RDW: 12.8 % (ref 11.5–15.5)
WBC: 9.9 K/uL (ref 4.0–10.5)
nRBC: 0 % (ref 0.0–0.2)

## 2024-12-07 LAB — BASIC METABOLIC PANEL WITH GFR
Anion gap: 14 (ref 5–15)
BUN: 44 mg/dL — ABNORMAL HIGH (ref 8–23)
CO2: 23 mmol/L (ref 22–32)
Calcium: 9.8 mg/dL (ref 8.9–10.3)
Chloride: 102 mmol/L (ref 98–111)
Creatinine, Ser: 2.15 mg/dL — ABNORMAL HIGH (ref 0.44–1.00)
GFR, Estimated: 25 mL/min — ABNORMAL LOW
Glucose, Bld: 102 mg/dL — ABNORMAL HIGH (ref 70–99)
Potassium: 4.4 mmol/L (ref 3.5–5.1)
Sodium: 140 mmol/L (ref 135–145)

## 2024-12-07 LAB — TROPONIN T, HIGH SENSITIVITY
Troponin T High Sensitivity: 17 ng/L (ref 0–19)
Troponin T High Sensitivity: <15 ng/L (ref 0–19)

## 2024-12-07 LAB — D-DIMER, QUANTITATIVE: D-Dimer, Quant: 0.96 ug{FEU}/mL — ABNORMAL HIGH (ref 0.00–0.50)

## 2024-12-07 MED ORDER — SODIUM CHLORIDE 0.9 % IV BOLUS
1000.0000 mL | Freq: Once | INTRAVENOUS | Status: AC
  Administered 2024-12-07: 1000 mL via INTRAVENOUS

## 2024-12-07 MED ORDER — ASPIRIN 81 MG PO CHEW
324.0000 mg | CHEWABLE_TABLET | Freq: Once | ORAL | Status: AC
  Administered 2024-12-07: 324 mg via ORAL
  Filled 2024-12-07: qty 4

## 2024-12-07 MED ORDER — ACETAMINOPHEN 500 MG PO TABS
1000.0000 mg | ORAL_TABLET | Freq: Once | ORAL | Status: AC
  Administered 2024-12-07: 1000 mg via ORAL
  Filled 2024-12-07: qty 2

## 2024-12-07 MED ORDER — HEPARIN BOLUS VIA INFUSION
3500.0000 [IU] | Freq: Once | INTRAVENOUS | Status: AC
  Administered 2024-12-07: 3500 [IU] via INTRAVENOUS

## 2024-12-07 MED ORDER — HEPARIN (PORCINE) 25000 UT/250ML-% IV SOLN
850.0000 [IU]/h | INTRAVENOUS | Status: DC
  Administered 2024-12-07: 950 [IU]/h via INTRAVENOUS
  Filled 2024-12-07: qty 250

## 2024-12-07 MED ORDER — SODIUM CHLORIDE 0.9 % IV BOLUS
500.0000 mL | Freq: Once | INTRAVENOUS | Status: AC
  Administered 2024-12-07: 500 mL via INTRAVENOUS

## 2024-12-07 NOTE — ED Triage Notes (Signed)
 Pt reports lower left chest pain for 3 days, worsening today from a 3 to an 8. Pain worsens with deep breathe.  Denies n/v/dizziness.  AAOx4

## 2024-12-07 NOTE — Congregational Nurse Program (Signed)
" °  Dept: [phone]   Congregational Nurse Program Note  Date of Encounter: 12/07/2024  Past Medical History: Past Medical History:  Diagnosis Date   Anxiety    Bipolar affective (HCC)    Chronic headaches    Depression    Hypercholesteremia    Hypertension    Restless leg syndrome     Encounter Details:  Community Questionnaire - 12/07/24 1245       Questionnaire   Ask client: Do you give verbal consent for me to treat you today? Yes    Student Assistance N/A    Location Patient Served  [facility]    Encounter Setting CN site    Population Status Unknown    Insurance Medicaid    Insurance/Financial Assistance Referral N/A    Medication Have Medication Insecurities    Medical Provider No    Medical Referrals Made ED    Medical Appointment Completed N/A    Screenings CN Performed (remember to also record results) NA   o2 99%   CNP Interventions Case Management;Advocate/Support;Navigate Healthcare System    ED Visit Averted N/A         Client presented to the RN office from [facility] with complaint of left-sided chest pain, described as low chest pain that worsens with inspiration. Client denies shortness of breath; however, RN observed shallow respirations. When questioned, client stated she is unable to take deep breaths due to pain severity. Client denies numbness, tingling, or other associated symptoms. Client denies recent falls, trauma, or chest congestion.  RN advised immediate evaluation in the emergency department. Client declined ambulance transport, stating her mother is present to transport her. Client was instructed to proceed directly to the emergency department. Client verbalized understanding.   "

## 2024-12-07 NOTE — Progress Notes (Signed)
 PHARMACY - ANTICOAGULATION CONSULT NOTE  Pharmacy Consult for Heparin  Indication: r/o pulmonary embolus  Allergies[1]  Patient Measurements:    Vital Signs: Temp: 97.6 F (36.4 C) (01/07 1416) Temp Source: Oral (01/07 1416) BP: 105/71 (01/07 1800) Pulse Rate: 62 (01/07 1800)  Labs: Recent Labs    12/07/24 1354  HGB 12.2  HCT 35.8*  PLT 423*  CREATININE 2.15*    CrCl cannot be calculated (Unknown ideal weight.).   Medical History: Past Medical History:  Diagnosis Date   Anxiety    Bipolar affective (HCC)    Chronic headaches    Depression    Hypercholesteremia    Hypertension    Restless leg syndrome     Medications:  Awaiting home meds  Assessment: 64 y.o. F presents with CP. To begin hepairn for r/o PE. No AC PTA.  CBC ok on admission  Goal of Therapy:  Heparin  level 0.3-0.7 units/ml Monitor platelets by anticoagulation protocol: Yes   Plan:  Heparin  IV bolus 3500 units Heparin  gtt at 950 units/hr Will f/u heparin  level in hours Daily heparin  level and CBC  Vito Ralph, PharmD, BCPS Please see amion for complete clinical pharmacist phone list 12/07/2024,6:25 PM      [1]  Allergies Allergen Reactions   Quetiapine  Other (See Comments)    Causes a lot of lethargy the following morning   Vancomycin  Itching and Other (See Comments)    All-over, severe itching

## 2024-12-07 NOTE — ED Notes (Signed)
 Ambulatory to restroom

## 2024-12-07 NOTE — ED Provider Notes (Signed)
 " [place] EMERGENCY DEPARTMENT AT [facility] Provider Note   CSN: [id number] Arrival date & time: 12/07/24  1337     Patient presents with: Chest Pain  HPI [name] Hinton is a 64 y.o. female with hypertension, hypercholesterolemia, bipolar 1 presenting for left chest pain.  Started 3 days ago.  Unsure if she is short of breath but it hurts to take deep breaths.  The pain is in the left lower chest and is sharp and nonradiating otherwise.  Not necessarily worse with exertion.  Denies history of blood clots, history of malignancy, calf tenderness or swelling.  Denies cough or fever.  Denies any pain in the abdomen.  Denies nausea vomiting diarrhea and urinary symptoms.  She reports she did have a hospitalization for 3 weeks for depression in December.  She reports that her blood pressure did run low periodically during her admission.   Patient also reported that around 10 AM this morning she took metoprolol  for tachycardia and Klonopin  for anxiety and also her valsartan and spironolactone .    Chest Pain      Prior to Admission medications  Medication Sig Start Date End Date Taking? Authorizing Provider  ARTIFICIAL TEARS PF 0.1-0.3 % SOLN Place 1 drop into both eyes 3 (three) times daily as needed (for dryness).    [provider]  aspirin  EC 81 MG tablet Take 81 mg by mouth at bedtime. Swallow whole.    [provider]  clonazePAM  (KLONOPIN ) 0.25 MG disintegrating tablet Take 1 tablet (0.25 mg total) by mouth daily. 11/29/24   Shrivastava, Aryendra, MD  gabapentin  (NEURONTIN ) 300 MG capsule TAKE 1 CAPSULE BY MOUTH THREE TIMES A DAY 06/06/24   Williams, Megan E, NP  metoprolol  tartrate (LOPRESSOR ) 50 MG tablet TAKE 1 TABLET BY MOUTH 2 TIMES DAILY AS NEEDED (MAY TAKE 50MG  1-2 TIMES DAILY FOR IRREGULAR HEART BEAT). 09/02/24   Croitoru, Mihai, MD  naproxen  sodium (ALEVE ) 220 MG tablet Take 220-440 mg by mouth 2 (two) times daily as needed (for mild pain or headaches).     [provider]  nicotine  (NICODERM CQ  - DOSED IN MG/24 HOURS) 14 mg/24hr patch Place 1 patch (14 mg total) onto the skin daily. 11/29/24   Shrivastava, Aryendra, MD  omeprazole (PRILOSEC OTC) 20 MG tablet Take 20 mg by mouth daily as needed (for reflux).    [provider]  rOPINIRole  (REQUIP ) 1 MG tablet Take 1 mg by mouth at bedtime. 05/11/23   [provider]  simvastatin  (ZOCOR ) 20 MG tablet Take 20 mg by mouth at bedtime. 10/01/20   [provider]  spironolactone -hydrochlorothiazide  (ALDACTAZIDE) 25-25 MG tablet Take 1 tablet by mouth daily. 03/08/24   Croitoru, Mihai, MD  TYLENOL  8 HOUR 650 MG CR tablet Take 650-1,300 mg by mouth every 8 (eight) hours as needed for pain (or headaches).    [provider]  valsartan (DIOVAN) 320 MG tablet Take 320 mg by mouth daily. 02/02/23   [provider]  venlafaxine  XR (EFFEXOR -XR) 150 MG 24 hr capsule Take 1 capsule (150 mg total) by mouth daily with breakfast. 11/29/24   Hellen Becket, MD    Allergies: Quetiapine  and Vancomycin     Review of Systems  Cardiovascular:  Positive for chest pain.    Updated Vital Signs BP 95/64   Pulse (!) 57   Temp 98.1 F (36.7 C) (Oral)   Resp 16   Wt 58.1 kg   SpO2 96%   BMI 23.41 kg/m   Physical  Exam Vitals and nursing note reviewed. Exam conducted with a chaperone present.  HENT:     Head: Normocephalic and atraumatic.     Mouth/Throat:     Mouth: Mucous membranes are moist.  Eyes:     General:        Right eye: No discharge.        Left eye: No discharge.     Conjunctiva/sclera: Conjunctivae normal.  Cardiovascular:     Rate and Rhythm: Normal rate and regular rhythm.     Pulses: Normal pulses.     Heart sounds: Normal heart sounds.     Comments: Inspected heart with the ultrasound probe via subxiphoid view: Contractility appeared normal and no obvious effusion. Pulmonary:     Effort: Pulmonary effort is normal.     Breath sounds:  Normal breath sounds.  Chest:    Abdominal:     General: Abdomen is flat.     Palpations: Abdomen is soft.  Skin:    General: Skin is warm and dry.  Neurological:     General: No focal deficit present.  Psychiatric:        Mood and Affect: Mood normal.     (all labs ordered are listed, but only abnormal results are displayed) Labs Reviewed  BASIC METABOLIC PANEL WITH GFR - Abnormal; Notable for the following components:      Result Value   Glucose, Bld 102 (*)    BUN 44 (*)    Creatinine, Ser 2.15 (*)    GFR, Estimated 25 (*)    All other components within normal limits  CBC - Abnormal; Notable for the following components:   HCT 35.8 (*)    Platelets 423 (*)    All other components within normal limits  D-DIMER, QUANTITATIVE - Abnormal; Notable for the following components:   D-Dimer, Quant 0.96 (*)    All other components within normal limits  HEPARIN  LEVEL (UNFRACTIONATED)  CBC  TROPONIN T, HIGH SENSITIVITY  TROPONIN T, HIGH SENSITIVITY    EKG: EKG Interpretation Date/Time:  Wednesday December 07 2024 13:47:03 EST Ventricular Rate:  63 PR Interval:  181 QRS Duration:  67 QT Interval:  433 QTC Calculation: 444 R Axis:   55  Text Interpretation: Sinus rhythm LAE, consider biatrial enlargement Anteroseptal infarct, age indeterminate PR depressions Confirmed by Dreama Longs ([id number]) on 12/07/2024 3:53:59 PM  Radiology: [facility] Chest Port 1 View Result Date: 12/07/2024 CLINICAL DATA:  Chest pain EXAM: PORTABLE CHEST 1 VIEW COMPARISON:  11/09/2024 FINDINGS: Normal-sized heart. Decreased inspiration with interval left lower lobe and lingular atelectasis and minimal right basilar atelectasis. Otherwise, clear lungs with normal vascularity. Diffuse osteopenia. Right inferior glenohumeral loose body. Stable mild scoliosis. IMPRESSION: Decreased inspiration with interval left lower lobe and lingular atelectasis and minimal right basilar atelectasis. Electronically Signed   By:  Elspeth Bathe M.D.   On: 12/07/2024 14:45     .Critical Care  Performed by: Lang Norleen POUR, PA-C Authorized by: Lang Norleen POUR, PA-C   Critical care provider statement:    Critical care time (minutes):  30   Critical care was necessary to treat or prevent imminent or life-threatening deterioration of the following conditions:  Renal failure (AKI with persistent hypotension requiring IV fluid resuscitation)   Critical care was time spent personally by me on the following activities:  Development of treatment plan with patient or surrogate, discussions with consultants, evaluation of patient's response to treatment, examination of patient, ordering and review of laboratory studies, ordering and review  of radiographic studies, ordering and performing treatments and interventions, pulse oximetry, re-evaluation of patient's condition and review of old charts    Medications Ordered in the ED  heparin  ADULT infusion 100 units/mL (25000 units/250mL) (950 Units/hr Intravenous New Bag/Given 12/07/24 1916)  aspirin  chewable tablet 324 mg (324 mg Oral Given 12/07/24 1423)  acetaminophen  (TYLENOL ) tablet 1,000 mg (1,000 mg Oral Given 12/07/24 1424)  sodium chloride  0.9 % bolus 1,000 mL (0 mLs Intravenous Stopped 12/07/24 1538)  sodium chloride  0.9 % bolus 500 mL (0 mLs Intravenous Stopped 12/07/24 1720)  heparin  bolus via infusion 3,500 Units (3,500 Units Intravenous Bolus from Bag 12/07/24 1920)                                    Medical Decision Making Amount and/or Complexity of Data Reviewed Labs: ordered. Radiology: ordered.  Risk OTC drugs. Prescription drug management. Decision regarding hospitalization.   Initial Impression and Ddx 64 year old well-appearing female presenting for chest pain.  Exam notable for initial hypotension but otherwise reassuring.  DDx includes ACS, PE, aortic dissection, arrhythmia, sepsis, cardiac tamponade, AKI, other. Patient PMH that increases complexity of ED  encounter:  hypertension, hypercholesterolemia, bipolar 1  Interpretation of Diagnostics - I independent reviewed and interpreted the labs as followed: D-dimer 0.96, BUN 44, creatinine 2.15, GFR 25, baseline GFR is greater than 60  - I independently visualized the following imaging with scope of interpretation limited to determining acute life threatening conditions related to emergency care: Chest x-ray, which revealed decreased inspiration with interval left lower lobe and lingular atelectasis and minimal right basilar atelectasis  - I have personally reviewed and interpreted EKG which revealed sinus rhythm  Patient Reassessment and Ultimate Disposition/Management On reassessment patient still having chest pain.  With elevated D-dimer, pleuritic chest pain and recent hospitalization where she stated she was lying in bed most of the time for 3 weeks there is concern for PE.  However she has remained hemodynamically stable on room air and blood pressure has improved with fluids.  Went ahead and heparinize her preemptively but because she has an AKI will forego CT angio at this time and I did order V/Q study when she is [facility].  Also she has AKI.  I also suspect that hypotension could be related to her taking Klonopin  and metoprolol  valsartan and spironolactone  this morning which she states is not typical for her.  AKI could be secondary to hypoperfusion in the setting of hypotension.  Workup does not suggest sepsis.  Admitted to hospital service.   Patient management required discussion with the following services or consulting groups:  Hospitalist Service  Complexity of Problems Addressed Acute complicated illness or Injury  Additional Data Reviewed and Analyzed Further history obtained from: Past medical history and medications listed in the EMR  Patient Encounter Risk Assessment Consideration of hospitalization      Final diagnoses:  AKI (acute kidney injury)   Hypotension, unspecified hypotension type  Chest pain, unspecified type    ED Discharge Orders     None          Lang Norleen POUR, PA-C 12/07/24 2130    Jerrol Agent, MD 12/08/24 2259  "

## 2024-12-07 NOTE — ED Notes (Signed)
 Pt d/c from hospial approx 10 days ago and states she was put on a water pill and has been urinating more frequently as a result.  Denies any other urinary symptoms.

## 2024-12-08 ENCOUNTER — Observation Stay (HOSPITAL_COMMUNITY)

## 2024-12-08 DIAGNOSIS — Z720 Tobacco use: Secondary | ICD-10-CM | POA: Diagnosis present

## 2024-12-08 DIAGNOSIS — Z743 Need for continuous supervision: Secondary | ICD-10-CM | POA: Diagnosis not present

## 2024-12-08 DIAGNOSIS — N179 Acute kidney failure, unspecified: Secondary | ICD-10-CM | POA: Diagnosis present

## 2024-12-08 DIAGNOSIS — I959 Hypotension, unspecified: Secondary | ICD-10-CM | POA: Diagnosis present

## 2024-12-08 DIAGNOSIS — G43909 Migraine, unspecified, not intractable, without status migrainosus: Secondary | ICD-10-CM | POA: Insufficient documentation

## 2024-12-08 DIAGNOSIS — R079 Chest pain, unspecified: Secondary | ICD-10-CM | POA: Diagnosis present

## 2024-12-08 DIAGNOSIS — R0781 Pleurodynia: Secondary | ICD-10-CM | POA: Diagnosis not present

## 2024-12-08 LAB — CBC WITH DIFFERENTIAL/PLATELET
Abs Immature Granulocytes: 0.04 K/uL (ref 0.00–0.07)
Basophils Absolute: 0.1 K/uL (ref 0.0–0.1)
Basophils Relative: 1 %
Eosinophils Absolute: 0.5 K/uL (ref 0.0–0.5)
Eosinophils Relative: 6 %
HCT: 31.1 % — ABNORMAL LOW (ref 36.0–46.0)
Hemoglobin: 10.5 g/dL — ABNORMAL LOW (ref 12.0–15.0)
Immature Granulocytes: 1 %
Lymphocytes Relative: 24 %
Lymphs Abs: 1.9 K/uL (ref 0.7–4.0)
MCH: 31.1 pg (ref 26.0–34.0)
MCHC: 33.8 g/dL (ref 30.0–36.0)
MCV: 92 fL (ref 80.0–100.0)
Monocytes Absolute: 0.6 K/uL (ref 0.1–1.0)
Monocytes Relative: 8 %
Neutro Abs: 4.6 K/uL (ref 1.7–7.7)
Neutrophils Relative %: 60 %
Platelets: 321 K/uL (ref 150–400)
RBC: 3.38 MIL/uL — ABNORMAL LOW (ref 3.87–5.11)
RDW: 13.1 % (ref 11.5–15.5)
WBC: 7.6 K/uL (ref 4.0–10.5)
nRBC: 0 % (ref 0.0–0.2)

## 2024-12-08 LAB — BASIC METABOLIC PANEL WITH GFR
Anion gap: 9 (ref 5–15)
BUN: 27 mg/dL — ABNORMAL HIGH (ref 8–23)
CO2: 22 mmol/L (ref 22–32)
Calcium: 9.1 mg/dL (ref 8.9–10.3)
Chloride: 107 mmol/L (ref 98–111)
Creatinine, Ser: 1.2 mg/dL — ABNORMAL HIGH (ref 0.44–1.00)
GFR, Estimated: 50 mL/min — ABNORMAL LOW
Glucose, Bld: 106 mg/dL — ABNORMAL HIGH (ref 70–99)
Potassium: 4.5 mmol/L (ref 3.5–5.1)
Sodium: 138 mmol/L (ref 135–145)

## 2024-12-08 LAB — COMPREHENSIVE METABOLIC PANEL WITH GFR
ALT: 16 U/L (ref 0–44)
AST: 20 U/L (ref 15–41)
Albumin: 3.6 g/dL (ref 3.5–5.0)
Alkaline Phosphatase: 80 U/L (ref 38–126)
Anion gap: 9 (ref 5–15)
BUN: 34 mg/dL — ABNORMAL HIGH (ref 8–23)
CO2: 23 mmol/L (ref 22–32)
Calcium: 8.7 mg/dL — ABNORMAL LOW (ref 8.9–10.3)
Chloride: 109 mmol/L (ref 98–111)
Creatinine, Ser: 1.35 mg/dL — ABNORMAL HIGH (ref 0.44–1.00)
GFR, Estimated: 44 mL/min — ABNORMAL LOW
Glucose, Bld: 90 mg/dL (ref 70–99)
Potassium: 3.9 mmol/L (ref 3.5–5.1)
Sodium: 140 mmol/L (ref 135–145)
Total Bilirubin: 0.2 mg/dL (ref 0.0–1.2)
Total Protein: 5.6 g/dL — ABNORMAL LOW (ref 6.5–8.1)

## 2024-12-08 LAB — HEPARIN LEVEL (UNFRACTIONATED)
Heparin Unfractionated: 0.87 [IU]/mL — ABNORMAL HIGH (ref 0.30–0.70)
Heparin Unfractionated: 0.66 [IU]/mL (ref 0.30–0.70)

## 2024-12-08 MED ORDER — ACETAMINOPHEN 325 MG PO TABS
650.0000 mg | ORAL_TABLET | Freq: Four times a day (QID) | ORAL | Status: DC | PRN
  Administered 2024-12-08: 650 mg via ORAL
  Filled 2024-12-08: qty 2

## 2024-12-08 MED ORDER — CLONAZEPAM 0.125 MG PO TBDP
0.2500 mg | ORAL_TABLET | Freq: Every day | ORAL | Status: DC
  Administered 2024-12-08 – 2024-12-09 (×2): 0.25 mg via ORAL
  Filled 2024-12-08 (×2): qty 2

## 2024-12-08 MED ORDER — ONDANSETRON HCL 4 MG PO TABS: 4.0000 mg | ORAL_TABLET | Freq: Four times a day (QID) | ORAL | Status: DC | PRN

## 2024-12-08 MED ORDER — ACETAMINOPHEN 325 MG PO TABS
650.0000 mg | ORAL_TABLET | Freq: Once | ORAL | Status: AC
  Administered 2024-12-08: 650 mg via ORAL
  Filled 2024-12-08: qty 2

## 2024-12-08 MED ORDER — SODIUM CHLORIDE 0.9 % IV SOLN: INTRAVENOUS | Status: DC

## 2024-12-08 MED ORDER — SODIUM CHLORIDE 0.9 % IV BOLUS: 500.0000 mL | Freq: Once | INTRAVENOUS | Status: DC

## 2024-12-08 MED ORDER — GABAPENTIN 300 MG PO CAPS
300.0000 mg | ORAL_CAPSULE | Freq: Three times a day (TID) | ORAL | Status: DC
  Administered 2024-12-08 – 2024-12-09 (×2): 300 mg via ORAL
  Filled 2024-12-08 (×2): qty 1

## 2024-12-08 MED ORDER — NICOTINE 14 MG/24HR TD PT24
14.0000 mg | MEDICATED_PATCH | Freq: Every day | TRANSDERMAL | Status: DC | PRN
  Filled 2024-12-08: qty 1

## 2024-12-08 MED ORDER — ACETAMINOPHEN 650 MG RE SUPP: 650.0000 mg | Freq: Four times a day (QID) | RECTAL | Status: DC | PRN

## 2024-12-08 MED ORDER — VENLAFAXINE HCL ER 150 MG PO CP24
150.0000 mg | ORAL_CAPSULE | Freq: Every day | ORAL | Status: DC
  Administered 2024-12-09: 150 mg via ORAL
  Filled 2024-12-08: qty 1

## 2024-12-08 MED ORDER — ONDANSETRON HCL 4 MG/2ML IJ SOLN: 4.0000 mg | Freq: Four times a day (QID) | INTRAMUSCULAR | Status: DC | PRN

## 2024-12-08 MED ORDER — TECHNETIUM TO 99M ALBUMIN AGGREGATED
4.4000 | Freq: Once | INTRAVENOUS | Status: AC
  Administered 2024-12-08: 4.4 via INTRAVENOUS

## 2024-12-08 MED ORDER — PANTOPRAZOLE SODIUM 40 MG PO TBEC: 40.0000 mg | DELAYED_RELEASE_TABLET | Freq: Every day | ORAL | Status: DC | PRN

## 2024-12-08 MED ORDER — SIMVASTATIN 20 MG PO TABS
20.0000 mg | ORAL_TABLET | Freq: Every day | ORAL | Status: DC
  Administered 2024-12-08: 20 mg via ORAL
  Filled 2024-12-08: qty 1

## 2024-12-08 NOTE — H&P (Incomplete)
 "  Telemedicine History and Physical    Patient: Chau Savell Danzer FMW:[id number] DOB: 02/16/1961 DOA: 12/07/2024 DOS: the patient was seen and examined on 12/08/2024 PCP: Pcp, No   Referring Provider: Norleen Essex, PA-C Telemedicine Provider: Burnard Cunning, DO Patient Location: [facility] ED bed 5 Referring Diagnosis: Pleuritic chest pain Patient Name and DOB verified: Alysen Smylie Vanasten, October 25, 1961 Patient consented to Telemedicine Evaluation: Yes RN virtual assistant: Dasie Bergeron, RN Video encounter time and date: 12/08/2024 ***   Patient coming from: Home  Chief Complaint:  Chief Complaint  Patient presents with   Chest Pain   HPI: Sharma Lawrance Bitner is a 64 y.o. female with medical history significant of hypertension, hypercholesterolemia, bipolar 1 disorder who presented to [facility] ED yesterday for evaluation of left-sided pleuritic chest pain.   ED course -  Initial vitals -   Labs obtained including Imaging -  --     Review of Systems: As mentioned in the history of present illness. All other systems reviewed and are negative.   Past Medical History:  Diagnosis Date   Anxiety    Bipolar affective (HCC)    Chronic headaches    Depression    Hypercholesteremia    Hypertension    Restless leg syndrome    Past Surgical History:  Procedure Laterality Date   ABDOMINAL HYSTERECTOMY     OOPHORECTOMY     TONSILLECTOMY     Social History:  reports that she has been smoking cigarettes. She has never used smokeless tobacco. She reports that she does not drink alcohol  and does not use drugs.  Allergies[1]  Family History  Problem Relation Age of Onset   Breast cancer Neg Hx     Prior to Admission medications  Medication Sig Start Date End Date Taking? Authorizing Provider  ARTIFICIAL TEARS PF 0.1-0.3 % SOLN Place 1 drop into both eyes 3 (three) times daily as needed (for dryness).    [provider]  aspirin  EC 81 MG tablet Take 81 mg by mouth at bedtime.  Swallow whole.    [provider]  clonazePAM  (KLONOPIN ) 0.25 MG disintegrating tablet Take 1 tablet (0.25 mg total) by mouth daily. 11/29/24   Shrivastava, Aryendra, MD  gabapentin  (NEURONTIN ) 300 MG capsule TAKE 1 CAPSULE BY MOUTH THREE TIMES A DAY 06/06/24   Williams, Megan E, NP  metoprolol  tartrate (LOPRESSOR ) 50 MG tablet TAKE 1 TABLET BY MOUTH 2 TIMES DAILY AS NEEDED (MAY TAKE 50MG  1-2 TIMES DAILY FOR IRREGULAR HEART BEAT). 09/02/24   Croitoru, Mihai, MD  naproxen  sodium (ALEVE ) 220 MG tablet Take 220-440 mg by mouth 2 (two) times daily as needed (for mild pain or headaches).    [provider]  nicotine  (NICODERM CQ  - DOSED IN MG/24 HOURS) 14 mg/24hr patch Place 1 patch (14 mg total) onto the skin daily. 11/29/24   Shrivastava, Aryendra, MD  omeprazole (PRILOSEC OTC) 20 MG tablet Take 20 mg by mouth daily as needed (for reflux).    [provider]  rOPINIRole  (REQUIP ) 1 MG tablet Take 1 mg by mouth at bedtime. 05/11/23   [provider]  simvastatin  (ZOCOR ) 20 MG tablet Take 20 mg by mouth at bedtime. 10/01/20   [provider]  spironolactone -hydrochlorothiazide  (ALDACTAZIDE) 25-25 MG tablet Take 1 tablet by mouth daily. 03/08/24   Croitoru, Mihai, MD  TYLENOL  8 HOUR 650 MG CR tablet Take 650-1,300 mg by mouth every 8 (eight) hours as needed for pain (or headaches).    [provider]  valsartan (  DIOVAN) 320 MG tablet Take 320 mg by mouth daily. 02/02/23   [provider]  venlafaxine  XR (EFFEXOR -XR) 150 MG 24 hr capsule Take 1 capsule (150 mg total) by mouth daily with breakfast. 11/29/24   Hellen Becket, MD    Physical Exam: Vitals:   12/08/24 0627 12/08/24 0700 12/08/24 0744 12/08/24 0900  BP: 95/65 (!) 98/53 101/64 105/70  Pulse: (!) 58 (!) 55 (!) 54 (!) 58  Resp:   18   Temp:   98.1 F (36.7 C)   TempSrc:   Oral   SpO2: 97% 95% 97% 97%  Weight:        Bedside physical exam was performed by RN listed above. Below  exam findings are based on their in person physical exam findings and my observations during virtual encounter.  General exam: awake, alert, no acute distress HEENT: atraumatic, clear conjunctiva, anicteric sclera, ***moist mucus membranes, hearing grossly normal  Respiratory system: CTAB***, no wheezes, rales or rhonchi, normal respiratory effort. Cardiovascular system: normal S1/S2, *** RRR, no JVD, murmurs, rubs, gallops, *** no pedal edema.   Gastrointestinal system: soft, NT, ND, no HSM felt, +bowel sounds. Central nervous system: A&O x***. no gross focal neurologic deficits, normal speech Extremities: moves all ***, no edema, normal tone Skin: dry, intact, normal temperature, normal color, ***No rashes, lesions or ulcers Psychiatry: normal mood, congruent affect, judgement and insight appear normal   Data Reviewed: {Tip this will not be part of the note when signed- Document your independent interpretation of telemetry tracing, EKG, lab, Radiology test or any other diagnostic tests. Add any new diagnostic test ordered today. (Optional):[id number]} As reviewed in detail above  Assessment and Plan: *** * Pleuritic chest pain Elevated D-dimer Concern for acute PE, given history of recent 3 week admission for depression during which time pt was mostly in bed. Unlikely ACS, troponin normal x 2. Left pulmonary infarct vs pneumonia --Started on empiric IV heparin  in the ED - continue for now. --Given Rocephin/Zithromax for empiric PNA coverage in the ED - continue for now. --Monitor clinically for s/sx's of infection - monitor fever curve, CBC --CTA chest deferred due to AKI. --V/Q scan ordered - follow results --Telemetry --Echo is PE is confirmed --LE venous doppler U/S  --Pain control and supportive care PRN per orders  AKI (acute kidney injury) Cr initially 2.15 in the ED.  Given IV fluids and Cr today improved to 1.35 >> 1.20. Baseline Cr appears normal 0.9-1.0 --Monitor  BMP --Continue IV fluids      Advance Care Planning:   Code Status: Prior ***  Consults: ***  Family Communication: ***  Severity of Illness: {Observation/Inpatient:21159}  Author: Burnard DELENA Cunning, DO 12/08/2024 10:44 AM  For on call review [email or web address].      [1]  Allergies Allergen Reactions   Quetiapine  Other (See Comments)    Causes a lot of lethargy the following morning   Vancomycin  Itching and Other (See Comments)    All-over, severe itching   "

## 2024-12-08 NOTE — ED Notes (Signed)
 [facility] called for ready bed transport

## 2024-12-08 NOTE — Assessment & Plan Note (Signed)
 Cr initially 2.15 in the ED.  Given IV fluids and Cr today improved to 1.35 >> 1.20. Baseline Cr appears normal 0.9-1.0 --Monitor BMP --Continue IV fluids

## 2024-12-08 NOTE — Plan of Care (Signed)

## 2024-12-08 NOTE — ED Provider Notes (Addendum)
 Prolonged waiting period in [facility] ED - no available beds anticipated at hospital today per bed management.  I've contacted [facility] ED who will accept patient to ED transfer as a boarding admitted patient, in order to complete V/Q perfusion scan which is not available here.  Dr Larnell Gravely EDP accepting at [facility]; however patient is admitted to hospitalist service   [name] Hinton, Donnice PARAS, MD 12/08/24 1019  Update - Inpatient bed now available 10:30 AM - will proceed to inpatient bed instead   Cottie Donnice PARAS, MD 12/08/24 1040

## 2024-12-08 NOTE — ED Notes (Addendum)
 Called lab to schedule pick up for heparin  lab

## 2024-12-08 NOTE — Assessment & Plan Note (Signed)
 Elevated D-dimer Concern for acute PE, given history of recent 3 week admission for depression during which time pt was mostly in bed. Unlikely ACS, troponin normal x 2. Left pulmonary infarct vs pneumonia --Started on empiric IV heparin  in the ED - continue for now. --Given Rocephin/Zithromax for empiric PNA coverage in the ED - continue for now. --Monitor clinically for s/sx's of infection - monitor fever curve, CBC --CTA chest deferred due to AKI. --V/Q scan ordered - follow results --Telemetry --Echo is PE is confirmed --LE venous doppler U/S  --Pain control and supportive care PRN per orders

## 2024-12-08 NOTE — ED Notes (Signed)
 [name] Hinton with [facility] called [facility] transfer, Dr Mannie

## 2024-12-08 NOTE — ED Notes (Signed)
 [facility] at bedside

## 2024-12-08 NOTE — ED Notes (Signed)
 Chart marked as green ready

## 2024-12-08 NOTE — H&P (Signed)
 " History and Physical    Patient: [name] Hinton FMW:[id number] DOB: July 29, 1961 DOA: 12/07/2024 DOS: the patient was seen and examined on 12/08/2024 PCP: Pcp, No  Patient coming from: Home  Chief Complaint:  Chief Complaint  Patient presents with   Chest Pain   HPI: [name] Hinton is a 64 y.o. female with medical history significant of anxiety depression, bipolar disorder, chronic headaches, hyperlipidemia, hypertension, restless leg syndrome, abscess of the left breast, osteopenia, history of low back pain who presented to the emergency department with complaints of pleuritic chest pain for the past 3 days.  She was [facility].  She denied fever, chills, rhinorrhea, sore throat, wheezing or hemoptysis.  No chest pain, palpitations, diaphoresis, PND, orthopnea or pitting edema of the lower extremities.  No abdominal pain, nausea, emesis, diarrhea, constipation, melena or hematochezia.  No flank pain, dysuria, frequency or hematuria.  No polyuria, polydipsia, polyphagia or blurred vision.   Lab work: Troponin x 2 were normal.  CBC showed a white count of 9.9, hemoglobin 12.3 g/dL and platelets 576.  BMP showed normal electrolytes, glucose 102, BUN 44 and creatinine 2.15 mg/dL.  Imaging: Portable 1 view chest radiograph showed decreased inspiration with interval left lower lobe and lingular atelectasis and minimal right basilar atelectasis.  VQ scan was normal.  ED course: Initial vital signs were temperature 97.6 F, pulse 62, respiration 18, BP 78/52 mmHg and O2 sat 100% on room air.  The patient received acetaminophen  650 mg p.o. x 1 and was started on a heparin  infusion.  Review of Systems: As mentioned in the history of present illness. All other systems reviewed and are negative. Past Medical History:  Diagnosis Date   Anxiety    Bipolar affective (HCC)    Chronic headaches    Depression    Hypercholesteremia     Hypertension    Restless leg syndrome    Past Surgical History:  Procedure Laterality Date   ABDOMINAL HYSTERECTOMY     OOPHORECTOMY     TONSILLECTOMY     Social History:  reports that she has been smoking cigarettes. She has never used smokeless tobacco. She reports that she does not drink alcohol  and does not use drugs.  Allergies[1]  Family History  Problem Relation Age of Onset   Breast cancer Neg Hx     Prior to Admission medications  Medication Sig Start Date End Date Taking? Authorizing Provider  ARTIFICIAL TEARS PF 0.1-0.3 % SOLN Place 1 drop into both eyes 3 (three) times daily as needed (for dryness).   Yes [provider]  clonazePAM  (KLONOPIN ) 0.25 MG disintegrating tablet Take 1 tablet (0.25 mg total) by mouth daily. 11/29/24  Yes Shrivastava, Aryendra, MD  gabapentin  (NEURONTIN ) 300 MG capsule TAKE 1 CAPSULE BY MOUTH THREE TIMES A DAY 06/06/24  Yes Williams, Megan E, NP  metoprolol  tartrate (LOPRESSOR ) 50 MG tablet TAKE 1 TABLET BY MOUTH 2 TIMES DAILY AS NEEDED (MAY TAKE 50MG  1-2 TIMES DAILY FOR IRREGULAR HEART BEAT). 09/02/24  Yes Croitoru, Mihai, MD  naproxen  sodium (ALEVE ) 220 MG tablet Take 220-440 mg by mouth 2 (two) times daily as needed (for mild pain or headaches).   Yes [provider]  nicotine  (NICODERM CQ  - DOSED IN MG/24 HOURS) 14 mg/24hr patch Place 1 patch (14 mg total) onto the skin daily. 11/29/24  Yes Shrivastava, Aryendra, MD  omeprazole (PRILOSEC OTC) 20 MG tablet Take 20 mg by mouth daily  as needed (for reflux).   Yes [provider]  rOPINIRole  (REQUIP ) 1 MG tablet Take 1 mg by mouth at bedtime. 05/11/23  Yes [provider]  simvastatin  (ZOCOR ) 20 MG tablet Take 20 mg by mouth at bedtime. 10/01/20  Yes [provider]  spironolactone -hydrochlorothiazide  (ALDACTAZIDE) 25-25 MG tablet Take 1 tablet by mouth daily. 03/08/24  Yes Croitoru, Mihai, MD  TYLENOL  8 HOUR 650 MG CR tablet Take 650-1,300 mg by mouth every 8  (eight) hours as needed for pain (or headaches).   Yes [provider]  valsartan (DIOVAN) 320 MG tablet Take 320 mg by mouth daily. 02/02/23  Yes [provider]  venlafaxine  XR (EFFEXOR -XR) 150 MG 24 hr capsule Take 1 capsule (150 mg total) by mouth daily with breakfast. 11/29/24  Yes Hellen Becket, MD    Physical Exam: Vitals:   12/08/24 0744 12/08/24 0900 12/08/24 1115 12/08/24 1212  BP: 101/64 105/70 (!) 99/58 (!) 95/58  Pulse: (!) 54 (!) 58 66 63  Resp: 18  16   Temp: 98.1 F (36.7 C)  97.8 F (36.6 C)   TempSrc: Oral  Oral   SpO2: 97% 97% 96% 97%  Weight:       Physical Exam  Data Reviewed:  Results are pending, will review when available.  Assessment and Plan: * Pleuritic chest pain Elevated D-dimer Concern for acute PE,  given history of recent 3 week admission for depression during which time pt was mostly in bed. Unlikely ACS, troponin normal x 2. Left pulmonary infarct vs pneumonia VQ scan was normal. Observation/telemetry. Analgesics for pain control. Will discontinue heparin .  AKI (acute kidney injury) Cr initially 2.15 in the ED.   Given IV fluids and Cr today improved to 1.35 >> 1.20. Baseline Cr appears normal 0.9-1.0 Observation/telemetry. Continue IV fluids. Hold ARB/ACE. Hold diuretics. Avoid hypotension. Avoid nephrotoxins. Monitor intake and output. Monitor renal function electrolytes.    Benign essential hypertension Holding diuretic and ARB.    Hyperlipemia Continue simvastatin  20 mg p.o. daily.    Restless legs syndrome (RLS) Has not been using ropinirole . May order as needed.    Depression with anxiety   Bipolar I disorder, most recent episode depressed (HCC) Continue clonazepam  25 mcg p.o. daily. Continue Effexor  150 mg p.o. daily.    Tobacco use Continue NicoDerm patches. Tobacco cessation will be beneficial.    Advance Care Planning:   Code Status: Full Code   Consults:   Family  Communication:   Severity of Illness: The appropriate patient status for this patient is OBSERVATION. Observation status is judged to be reasonable and necessary in order to provide the required intensity of service to ensure the patient's safety. The patient's presenting symptoms, physical exam findings, and initial radiographic and laboratory data in the context of their medical condition is felt to place them at decreased risk for further clinical deterioration. Furthermore, it is anticipated that the patient will be medically stable for discharge from the hospital within 2 midnights of admission.   Author: Alm Dorn Castor, MD 12/08/2024 12:25 PM  For on call review [email or web address].      [1]  Allergies Allergen Reactions   Quetiapine  Other (See Comments)    Causes a lot of lethargy the following morning   Vancomycin  Itching and Other (See Comments)    All-over, severe itching   "

## 2024-12-08 NOTE — Progress Notes (Signed)
 PHARMACY - ANTICOAGULATION CONSULT NOTE  Pharmacy Consult for Heparin  Indication: r/o pulmonary embolus  Allergies[1]  Patient Measurements: Weight: 58.1 kg (128 lb)  Vital Signs: Temp: 97.8 F (36.6 C) (01/07 2300) Temp Source: Oral (01/07 2300) BP: 129/74 (01/08 0400) Pulse Rate: 56 (01/08 0407)  Labs: Recent Labs    12/07/24 1354 12/08/24 0351  HGB 12.2 10.5*  HCT 35.8* 31.1*  PLT 423* 321  HEPARINUNFRC  --  0.87*  CREATININE 2.15* 1.35*    Estimated Creatinine Clearance: 33.3 mL/min (A) (by C-G formula based on SCr of 1.35 mg/dL (H)).   Medical History: Past Medical History:  Diagnosis Date   Anxiety    Bipolar affective (HCC)    Chronic headaches    Depression    Hypercholesteremia    Hypertension    Restless leg syndrome     Assessment: 64 y.o. F presents with CP. To begin hepairn for r/o PE. No AC PTA.  CBC ok on admission  1/8 AM update:  Heparin  level supra-therapeutic   Goal of Therapy:  Heparin  level 0.3-0.7 units/ml Monitor platelets by anticoagulation protocol: Yes   Plan:  Dec heparin  to 850 units/hr Heparin  level in 8 hours  Lynwood Mckusick, PharmD, BCPS Clinical Pharmacist Phone: [phone]        [1]  Allergies Allergen Reactions   Quetiapine  Other (See Comments)    Causes a lot of lethargy the following morning   Vancomycin  Itching and Other (See Comments)    All-over, severe itching

## 2024-12-08 NOTE — Progress Notes (Signed)
 PHARMACY - ANTICOAGULATION CONSULT NOTE  Pharmacy Consult for heparin  Indication: rule out PE  Allergies[1]  Patient Measurements: Weight: 58.1 kg (128 lb)  Vital Signs: Temp: 97.8 F (36.6 C) (01/08 1115) Temp Source: Oral (01/08 1115) BP: 99/58 (01/08 1115) Pulse Rate: 66 (01/08 1115)  Labs: Recent Labs    12/07/24 1354 12/08/24 0351 12/08/24 0941  HGB 12.2 10.5*  --   HCT 35.8* 31.1*  --   PLT 423* 321  --   HEPARINUNFRC  --  0.87*  --   CREATININE 2.15* 1.35* 1.20*    Estimated Creatinine Clearance: 37.5 mL/min (A) (by C-G formula based on SCr of 1.2 mg/dL (H)).   Medical History: Past Medical History:  Diagnosis Date   Anxiety    Bipolar affective (HCC)    Chronic headaches    Depression    Hypercholesteremia    Hypertension    Restless leg syndrome     Medications: Pt not taking anticoagulants PTA  Assessment: Pt presented to ED with chest pain. Pharmacy consulted to dose heparin  for possible PE.  -No CTA given AKI -VQ scan ordered  Today, 12/08/2024 Heparin  level = 0.66 is therapeutic on heparin  infusion of 850 units/hr CBC: Hgb low/decreased, Plt WNL No bleeding or complications reported  Goal of Therapy:  Heparin  level 0.3-0.7 units/ml Monitor platelets by anticoagulation protocol: Yes   Plan:  Continue heparin  infusion at 850 units/hr Check confirmatory 6-8 hour heparin  level  CBC, heparin  level daily Monitor for signs of bleeding  Ronal CHRISTELLA Rav, PharmD 12/08/2024,11:35 AM    [1]  Allergies Allergen Reactions   Quetiapine  Other (See Comments)    Causes a lot of lethargy the following morning   Vancomycin  Itching and Other (See Comments)    All-over, severe itching

## 2024-12-09 ENCOUNTER — Observation Stay (HOSPITAL_COMMUNITY)

## 2024-12-09 ENCOUNTER — Other Ambulatory Visit (HOSPITAL_COMMUNITY): Payer: Self-pay

## 2024-12-09 DIAGNOSIS — R0781 Pleurodynia: Secondary | ICD-10-CM | POA: Diagnosis not present

## 2024-12-09 LAB — COMPREHENSIVE METABOLIC PANEL WITH GFR
ALT: 15 U/L (ref 0–44)
AST: 20 U/L (ref 15–41)
Albumin: 3.3 g/dL — ABNORMAL LOW (ref 3.5–5.0)
Alkaline Phosphatase: 82 U/L (ref 38–126)
Anion gap: 8 (ref 5–15)
BUN: 25 mg/dL — ABNORMAL HIGH (ref 8–23)
CO2: 22 mmol/L (ref 22–32)
Calcium: 8.6 mg/dL — ABNORMAL LOW (ref 8.9–10.3)
Chloride: 111 mmol/L (ref 98–111)
Creatinine, Ser: 0.99 mg/dL (ref 0.44–1.00)
GFR, Estimated: >60 mL/min
Glucose, Bld: 82 mg/dL (ref 70–99)
Potassium: 3.9 mmol/L (ref 3.5–5.1)
Sodium: 141 mmol/L (ref 135–145)
Total Bilirubin: <0.2 mg/dL (ref 0.0–1.2)
Total Protein: 5.6 g/dL — ABNORMAL LOW (ref 6.5–8.1)

## 2024-12-09 LAB — CBC
HCT: 31 % — ABNORMAL LOW (ref 36.0–46.0)
Hemoglobin: 10.2 g/dL — ABNORMAL LOW (ref 12.0–15.0)
MCH: 30.8 pg (ref 26.0–34.0)
MCHC: 32.9 g/dL (ref 30.0–36.0)
MCV: 93.7 fL (ref 80.0–100.0)
Platelets: 311 K/uL (ref 150–400)
RBC: 3.31 MIL/uL — ABNORMAL LOW (ref 3.87–5.11)
RDW: 12.7 % (ref 11.5–15.5)
WBC: 7 K/uL (ref 4.0–10.5)
nRBC: 0 % (ref 0.0–0.2)

## 2024-12-09 LAB — URINE DRUG SCREEN
Amphetamines: NEGATIVE
Barbiturates: NEGATIVE
Benzodiazepines: NEGATIVE
Cocaine: POSITIVE — AB
Fentanyl: NEGATIVE
Methadone Scn, Ur: NEGATIVE
Opiates: NEGATIVE
Tetrahydrocannabinol: NEGATIVE

## 2024-12-09 LAB — HIV ANTIBODY (ROUTINE TESTING W REFLEX): HIV Screen 4th Generation wRfx: NONREACTIVE

## 2024-12-09 MED ORDER — LIDOCAINE 5 % EX PTCH: 1.0000 | MEDICATED_PATCH | CUTANEOUS | Status: DC

## 2024-12-09 MED ORDER — LIDOCAINE 5 % EX PTCH
1.0000 | MEDICATED_PATCH | CUTANEOUS | 0 refills | Status: AC
  Filled 2024-12-09 (×2): qty 30, 30d supply, fill #0

## 2024-12-09 MED ORDER — ACETAMINOPHEN 325 MG PO TABS: 650.0000 mg | ORAL_TABLET | Freq: Four times a day (QID) | ORAL | Status: AC | PRN

## 2024-12-09 NOTE — Plan of Care (Signed)

## 2024-12-09 NOTE — Plan of Care (Signed)

## 2024-12-09 NOTE — Discharge Summary (Signed)
 Physician Discharge Summary  Patient ID: [name] Hinton MRN: [id number] DOB/AGE: 1961/03/29 64 y.o.  Admit date: 12/07/2024 Discharge date: 12/09/2024  Admission Diagnoses:  Discharge Diagnoses:  Principal Problem:   Pleuritic chest pain Active Problems:   Benign essential hypertension   Hyperlipemia   Restless legs syndrome (RLS)   Depression with anxiety   Bipolar I disorder, most recent episode depressed (HCC)   AKI (acute kidney injury)   Tobacco use   Cocaine abuse.   Discharged Condition: stable  Hospital Course: Patient is a 64 year old female with past medical history significant for anxiety depression, bipolar disorder, chronic headaches, hyperlipidemia, hypertension, restless leg syndrome, abscess of the left breast, osteopenia, and history of low back pain.  Patient was admitted with chest pain.  Troponin came back negative.  Left rib fracture x-ray came back negative.  UDS was positive for cocaine.  Pain has improved significantly.  Acute kidney injury has resolved.  Patient will follow-up with primary care provider and psychiatry team on discharge.  Pleuritic chest pain: -Elevated D-dimer -Negative troponin. - Negative VQ scan. - Left rib fracture x-ray was negative. - UDS was positive for cocaine. - Chest pain has resolved significantly.   AKI (acute kidney injury) Serum creatinine of 2.15 on presentation. - With volume resuscitation, acute kidney injury has resolved. - Avoid cocaine. - Avoid nephrotoxins. - Continue to monitor renal function and electrolytes.     Benign essential hypertension: Holding diuretic and ARB. Episode of low normal blood pressure documented (95/58 mmHg).   Hyperlipemia Continue simvastatin  20 mg p.o. daily.   Restless legs syndrome (RLS):  Depression with anxiety: Bipolar I disorder, most recent episode depressed [facility]): -Follow-up with psychiatry team on discharge.  Cocaine abuse: - Counseled to quit.   Tobacco  use: Counseled to quit.    Consults: None  Significant Diagnostic Studies:  -Negative troponin. -Serum creatinine of 2.15 on presentation, improved to 0.99 prior to discharge. -Urine drug screening was positive for cocaine. - Left rib x-ray was negative.  NUCLEAR MEDICINE PERFUSION LUNG SCAN: - Normal.   Discharge Exam: Blood pressure 120/69, pulse 78, temperature 98.4 F (36.9 C), resp. rate 19, height 5' 2 (1.575 m), weight 54.6 kg, SpO2 98%.   Disposition: Discharge disposition: 01-Home or Self Care       Discharge Instructions     Increase activity slowly   Complete by: As directed       Allergies as of 12/09/2024       Reactions   Quetiapine  Other (See Comments)   Causes a lot of lethargy the following morning   Vancomycin  Itching, Other (See Comments)   All-over, severe itching        Medication List     STOP taking these medications    metoprolol  tartrate 50 MG tablet Commonly known as: LOPRESSOR    naproxen  sodium 220 MG tablet Commonly known as: ALEVE    rOPINIRole  1 MG tablet Commonly known as: REQUIP    spironolactone -hydrochlorothiazide  25-25 MG tablet Commonly known as: Aldactazide   Tylenol  8 Hour 650 MG CR tablet Generic drug: acetaminophen  Replaced by: acetaminophen  325 MG tablet   valsartan 320 MG tablet Commonly known as: DIOVAN       TAKE these medications    acetaminophen  325 MG tablet Commonly known as: TYLENOL  Take 2 tablets (650 mg total) by mouth every 6 (six) hours as needed for mild pain (pain score 1-3) or fever (or Fever >/= 101). Replaces: Tylenol  8 Hour 650 MG CR tablet   Artificial Tears  PF 0.1-0.3 % Soln Generic drug: Dextran 70-Hypromellose (PF) Place 1 drop into both eyes 3 (three) times daily as needed (for dryness).   clonazePAM  0.25 MG disintegrating tablet Commonly known as: KLONOPIN  Take 1 tablet (0.25 mg total) by mouth daily.   gabapentin  300 MG capsule Commonly known as: NEURONTIN  TAKE 1  CAPSULE BY MOUTH THREE TIMES A DAY   lidocaine  5 % Commonly known as: LIDODERM  Place 1 patch onto the skin daily. Remove & Discard patch within 12 hours or as directed by MD Start taking on: December 10, 2024   nicotine  14 mg/24hr patch Commonly known as: NICODERM CQ  - dosed in mg/24 hours Place 1 patch (14 mg total) onto the skin daily.   omeprazole 20 MG tablet Commonly known as: PRILOSEC OTC Take 20 mg by mouth daily as needed (for reflux).   simvastatin  20 MG tablet Commonly known as: ZOCOR  Take 20 mg by mouth at bedtime.   venlafaxine  XR 150 MG 24 hr capsule Commonly known as: EFFEXOR -XR Take 1 capsule (150 mg total) by mouth daily with breakfast.       Time spent: 35 minutes.  SignedBETHA Leatrice LILLETTE Rosario 12/09/2024, 2:12 PM

## 2024-12-13 NOTE — Congregational Nurse Program (Signed)
" °  Dept: [phone]   Congregational Nurse Program Note  Date of Encounter: 12/13/2024  Past Medical History: Past Medical History:  Diagnosis Date   Anxiety    Bipolar affective (HCC)    Chronic headaches    Depression    Hypercholesteremia    Hypertension    Restless leg syndrome     Encounter Details:  Community Questionnaire - 12/13/24 1330       Questionnaire   Ask client: Do you give verbal consent for me to treat you today? Yes    Student Assistance N/A    Location Patient Served  [facility]    Encounter Setting CN site    Population Status Unknown    Insurance Medicaid    Insurance/Financial Assistance Referral N/A    Medication Have Medication Insecurities    Medical Provider No    Medical Referrals Made NA    Medical Appointment Completed ED    Screenings CN Performed (remember to also record results) NA   o2 99%   CNP Interventions Advocate/Support;Other Education    ED Visit Averted N/A         RN followed up with client after RN recently sent client to ED. Client states she is feeling much better. Client states she has been using pain patches that have been helpful. Client denies any needs at this time. RN able to provide emotional support to client.    "

## 2024-12-20 NOTE — Congregational Nurse Program (Signed)
" °  Dept: [phone]   Congregational Nurse Program Note  Date of Encounter: 12/20/2024  Past Medical History: Past Medical History:  Diagnosis Date   Anxiety    Bipolar affective (HCC)    Chronic headaches    Depression    Hypercholesteremia    Hypertension    Restless leg syndrome     Encounter Details:  Community Questionnaire - 12/20/24 1250       Questionnaire   Ask client: Do you give verbal consent for me to treat you today? Yes    Student Assistance N/A    Location Patient Served  [facility]    Encounter Setting CN site    Population Status Unknown    Insurance Medicaid    Insurance/Financial Assistance Referral N/A    Medication Have Medication Insecurities    Medical Provider No    Medical Referrals Made NA    Medical Appointment Completed N/A    Screenings CN Performed (remember to also record results) NA   o2 99%   CNP Interventions Advocate/Support;Case Management    ED Visit Averted N/A         RN saw client today at [facility]. RN able to provide client with list of room rentals within client's budget. Client denies any other needs at this time.   "

## 2024-12-22 ENCOUNTER — Encounter (HOSPITAL_COMMUNITY): Payer: Self-pay

## 2024-12-22 ENCOUNTER — Other Ambulatory Visit: Payer: Self-pay

## 2024-12-22 ENCOUNTER — Emergency Department (HOSPITAL_COMMUNITY)

## 2024-12-22 ENCOUNTER — Emergency Department (HOSPITAL_COMMUNITY)
Admission: EM | Admit: 2024-12-22 | Discharge: 2024-12-22 | Disposition: A | Discharge status: Home / Self Care | Attending: Emergency Medicine | Admitting: Emergency Medicine

## 2024-12-22 DIAGNOSIS — S0990XA Unspecified injury of head, initial encounter: Secondary | ICD-10-CM | POA: Insufficient documentation

## 2024-12-22 DIAGNOSIS — S40022A Contusion of left upper arm, initial encounter: Secondary | ICD-10-CM | POA: Diagnosis not present

## 2024-12-22 DIAGNOSIS — I10 Essential (primary) hypertension: Secondary | ICD-10-CM | POA: Diagnosis not present

## 2024-12-22 DIAGNOSIS — S59911A Unspecified injury of right forearm, initial encounter: Secondary | ICD-10-CM | POA: Diagnosis present

## 2024-12-22 DIAGNOSIS — W108XXA Fall (on) (from) other stairs and steps, initial encounter: Secondary | ICD-10-CM | POA: Diagnosis not present

## 2024-12-22 DIAGNOSIS — S5011XA Contusion of right forearm, initial encounter: Secondary | ICD-10-CM | POA: Diagnosis not present

## 2024-12-22 DIAGNOSIS — I6782 Cerebral ischemia: Secondary | ICD-10-CM | POA: Diagnosis not present

## 2024-12-22 LAB — BASIC METABOLIC PANEL WITH GFR
Anion gap: 11 (ref 5–15)
BUN: 32 mg/dL — ABNORMAL HIGH (ref 8–23)
CO2: 25 mmol/L (ref 22–32)
Calcium: 9.5 mg/dL (ref 8.9–10.3)
Chloride: 103 mmol/L (ref 98–111)
Creatinine, Ser: 1.2 mg/dL — ABNORMAL HIGH (ref 0.44–1.00)
GFR, Estimated: 50 mL/min — ABNORMAL LOW
Glucose, Bld: 120 mg/dL — ABNORMAL HIGH (ref 70–99)
Potassium: 4.6 mmol/L (ref 3.5–5.1)
Sodium: 139 mmol/L (ref 135–145)

## 2024-12-22 LAB — CBC WITH DIFFERENTIAL/PLATELET
Abs Immature Granulocytes: 0.14 K/uL — ABNORMAL HIGH (ref 0.00–0.07)
Basophils Absolute: 0.1 K/uL (ref 0.0–0.1)
Basophils Relative: 1 %
Eosinophils Absolute: 0.1 K/uL (ref 0.0–0.5)
Eosinophils Relative: 1 %
HCT: 32 % — ABNORMAL LOW (ref 36.0–46.0)
Hemoglobin: 10.6 g/dL — ABNORMAL LOW (ref 12.0–15.0)
Immature Granulocytes: 1 %
Lymphocytes Relative: 6 %
Lymphs Abs: 0.9 K/uL (ref 0.7–4.0)
MCH: 31.5 pg (ref 26.0–34.0)
MCHC: 33.1 g/dL (ref 30.0–36.0)
MCV: 95 fL (ref 80.0–100.0)
Monocytes Absolute: 0.7 K/uL (ref 0.1–1.0)
Monocytes Relative: 5 %
Neutro Abs: 12.7 K/uL — ABNORMAL HIGH (ref 1.7–7.7)
Neutrophils Relative %: 86 %
Platelets: 419 K/uL — ABNORMAL HIGH (ref 150–400)
RBC: 3.37 MIL/uL — ABNORMAL LOW (ref 3.87–5.11)
RDW: 12.9 % (ref 11.5–15.5)
WBC: 14.6 K/uL — ABNORMAL HIGH (ref 4.0–10.5)
nRBC: 0 % (ref 0.0–0.2)

## 2024-12-22 LAB — PROTIME-INR
INR: 1 (ref 0.8–1.2)
Prothrombin Time: 14 s (ref 11.4–15.2)

## 2024-12-22 LAB — APTT: aPTT: 26 s (ref 24–36)

## 2024-12-22 MED ORDER — ACETAMINOPHEN 500 MG PO TABS
1000.0000 mg | ORAL_TABLET | Freq: Once | ORAL | Status: AC
  Administered 2024-12-22: 1000 mg via ORAL
  Filled 2024-12-22: qty 2

## 2024-12-22 MED ORDER — ONDANSETRON HCL 4 MG/2ML IJ SOLN
4.0000 mg | Freq: Once | INTRAMUSCULAR | Status: AC
  Administered 2024-12-22: 4 mg via INTRAVENOUS
  Filled 2024-12-22: qty 2

## 2024-12-22 MED ORDER — DIAZEPAM 2 MG PO TABS
2.0000 mg | ORAL_TABLET | Freq: Once | ORAL | Status: AC
  Administered 2024-12-22: 2 mg via ORAL
  Filled 2024-12-22: qty 1

## 2024-12-22 MED ORDER — LACTATED RINGERS IV BOLUS
1000.0000 mL | Freq: Once | INTRAVENOUS | Status: AC
  Administered 2024-12-22: 1000 mL via INTRAVENOUS

## 2024-12-22 MED ORDER — LIDOCAINE 5 % EX PTCH
1.0000 | MEDICATED_PATCH | Freq: Once | CUTANEOUS | Status: DC
  Administered 2024-12-22: 1 via TRANSDERMAL
  Filled 2024-12-22: qty 1

## 2024-12-22 NOTE — ED Triage Notes (Signed)
 Pt sts she fell tonight hitting her head with no loc on the outside stairs of her group home. Per EMS when they arrived pt stated she vomited after the fall but not in their presence. Pt and ems denies anticoagulant therapy. Pt sts she loss her balance and fell. EMS cbg=136, bp138/100, hr=84.

## 2024-12-22 NOTE — ED Provider Notes (Signed)
 " [place] [facility] Provider Note   CSN: [id number] Arrival date & time: 12/22/24  2014     History Chief Complaint  Patient presents with   Fall    Pt sts she fell tonight hitting her head with no loc on the outside stairs of her group home. Per EMS when they arrived pt stated she vomited after the fall but not in their presence. Pt and ems denies anticoagulant therapy. Pt sts she loss her balance and fell. EMS cbg=136, bp138/100, hr=84.     HPI HPI: [name] Hinton is a 64 y.o. female with history perinent HTN, HLD, restless leg syndrome, bipolar disorder who presents complaining of fall downstairs.  Patient presents via EMS.  History provided by patient.  No interpreter required during this encounter.  Patient reports that she was walking down a staircase when she tripped and fell onto the staircase below, patient states that she fell down at least 3 stairs, however is not entirely sure.  Reports that she tripped over her feet, denies any preceding chest pain,, shortness of breath, no recent fevers, chills etc.  Reports that she is having some mild headache, and has had multiple episodes of emesis since the fall.  Denies any use of anticoagulants, denies loss of consciousness.  Patient reports that a third-party called EMS for her who brought her to the emergency department.  Patient with normal CBG and route, no interventions and route with EMS.  Patient's recorded medical, surgical, social, medication list and allergies were reviewed in the Snapshot window as part of the initial history.   Prior to Admission medications  Medication Sig Start Date End Date Taking? Authorizing Provider  acetaminophen  (TYLENOL ) 325 MG tablet Take 2 tablets (650 mg total) by mouth every 6 (six) hours as needed for mild pain (pain score 1-3) or fever (or Fever >/= 101). 12/09/24   Rosario Leatrice FERNS, MD  ARTIFICIAL TEARS PF 0.1-0.3 % SOLN Place 1 drop into both eyes 3  (three) times daily as needed (for dryness).    [provider]  clonazePAM  (KLONOPIN ) 0.25 MG disintegrating tablet Take 1 tablet (0.25 mg total) by mouth daily. 11/29/24   Shrivastava, Aryendra, MD  gabapentin  (NEURONTIN ) 300 MG capsule TAKE 1 CAPSULE BY MOUTH THREE TIMES A DAY 06/06/24   Williams, Megan E, NP  lidocaine  (LIDODERM ) 5 % Place 1 patch onto the skin daily. Remove & Discard patch within 12 hours or as directed by MD 12/10/24   Rosario Leatrice FERNS, MD  nicotine  (NICODERM CQ  - DOSED IN MG/24 HOURS) 14 mg/24hr patch Place 1 patch (14 mg total) onto the skin daily. 11/29/24   Shrivastava, Aryendra, MD  omeprazole (PRILOSEC OTC) 20 MG tablet Take 20 mg by mouth daily as needed (for reflux).    [provider]  simvastatin  (ZOCOR ) 20 MG tablet Take 20 mg by mouth at bedtime. 10/01/20   [provider]  venlafaxine  XR (EFFEXOR -XR) 150 MG 24 hr capsule Take 1 capsule (150 mg total) by mouth daily with breakfast. 11/29/24   Shrivastava, Aryendra, MD     Allergies: Quetiapine  and Vancomycin    Review of Systems   ROS as per HPI  Physical Exam Updated Vital Signs BP 115/84 (BP Location: Right Arm)   Pulse 74   Temp 97.8 F (36.6 C) (Oral)   Resp 14   Ht 5' 2 (1.575 m)   Wt 57.2 kg   SpO2 100%   BMI 23.08 kg/m  Physical  Exam Vitals and nursing note reviewed.  Constitutional:      General: She is not in acute distress.    Appearance: She is well-developed.  HENT:     Head: Normocephalic and atraumatic.  Eyes:     Conjunctiva/sclera: Conjunctivae normal.  Cardiovascular:     Rate and Rhythm: Normal rate and regular rhythm.     Heart sounds: No murmur heard. Pulmonary:     Effort: Pulmonary effort is normal. No respiratory distress.     Breath sounds: Normal breath sounds.  Abdominal:     Palpations: Abdomen is soft.     Tenderness: There is no abdominal tenderness.  Musculoskeletal:        General: No swelling.     Cervical back: Neck supple. No  bony tenderness.     Thoracic back: No bony tenderness.     Lumbar back: No bony tenderness.     Comments: Chest stable to AP and lateral compression, pelvis stable to AP and lateral compression, all 4 extremities without gross deformity, bony point tenderness.  There is ecchymosis to the right forearm and the left upper arm, though nontender.  Skin:    General: Skin is warm and dry.     Capillary Refill: Capillary refill takes less than 2 seconds.  Neurological:     Mental Status: She is alert.     GCS: GCS eye subscore is 4. GCS verbal subscore is 4.  Psychiatric:        Mood and Affect: Mood normal.     ED Course/ Medical Decision Making/ A&P    Procedures Procedures   Medications Ordered in ED Medications  lidocaine  (LIDODERM ) 5 % 1 patch (has no administration in time range)  diazepam  (VALIUM ) tablet 2 mg (has no administration in time range)  acetaminophen  (TYLENOL ) tablet 1,000 mg (1,000 mg Oral Given 12/22/24 2045)  ondansetron  (ZOFRAN ) injection 4 mg (4 mg Intravenous Given 12/22/24 2053)  lactated ringers  bolus 1,000 mL (1,000 mLs Intravenous New Bag/Given 12/22/24 2053)    Medical Decision Making:   [name] is a 64 y.o. female who presents for fall downstairs as per above.  Physical exam is pertinent for GCS 14, ecchymosis to left upper arm and right forearm.   The differential includes but is not limited to ICH, TBI, skull fracture, spinal fracture/dislocation, blunt thoracic trauma, hemothorax, pneumothorax, rib fractures, blunt abdominal trauma, hemorrhage, extremity fracture, dislocation.  Independent historian: None  External data reviewed: Labs: reviewed prior labs for baseline  Labs: Ordered and Independent interpretation CBC: Slight nonspecific leukocytosis, likely stress to margination in the setting of fall.  Stable anemia on comparison to prior.  No thrombocytopenia. BMP: Normal derangements stable in comparison to prior.  No emergent electrolyte  derangement. Coags: Reassuring  Radiology: Ordered, Independent interpretation, All images reviewed independently. Agree with radiology report at this time.   CXR: No acute cardiac or pulmonary abnormality. No appreciable rib fx. No PTX.  Pelvis XR: Pelvic ring intact. No acute fx. Both hips located.  CT head: No ICH or displaced skull fracture CT C-spine: No displaced fracture or dislocation Extremity XR's: Do not appreciate displaced fracture or dislocation of the patient's left upper arm or right forearm CT CERVICAL SPINE WO CONTRAST Result Date: 12/22/2024 EXAM: CT CERVICAL SPINE WITHOUT CONTRAST 12/22/2024 10:23:40 PM TECHNIQUE: CT of the cervical spine was performed without the administration of intravenous contrast. Multiplanar reformatted images are provided for review. Automated exposure control, iterative reconstruction, and/or weight based adjustment of the mA/kV  was utilized to reduce the radiation dose to as low as reasonably achievable. COMPARISON: None available. CLINICAL HISTORY: Polytrauma, blunt. FINDINGS: BONES AND ALIGNMENT: Phrased degenerative anterolisthesis of C4 on C5 and C5 on C6. No evidence of traumatic malalignment. There is subtle loop of curvature at the cervicothoracic junction. DEGENERATIVE CHANGES: Degenerative anterolisthesis of C4 on C5 and C5 on C6. Disc space narrowing at multiple levels, most pronounced at C6-C7. Disc osteophyte complex at C6-C7 results in at least mild osseous spinal canal stenosis. Bilateral facet arthrosis and uncovertebral hypertrophy at C6-C7 resulting in moderate bilateral foraminal stenosis. There is additional severe foraminal stenosis noted on the right at C5-C6. Severe foraminal stenosis also noted on the right at C3-C4. There is moderate to severe foraminal stenosis bilaterally at C4-C5. SOFT TISSUES: No prevertebral soft tissue swelling. Subcentimeter nodules in the thyroid. IMPRESSION: 1. No evidence of acute traumatic injury. 2.  Degenerative changes as above. Electronically signed by: Donnice Mania MD 12/22/2024 10:35 PM EST RP Workstation: [id number]   CT HEAD WO CONTRAST Result Date: 12/22/2024 EXAM: CT HEAD WITHOUT CONTRAST 12/22/2024 10:23:40 PM TECHNIQUE: CT of the head was performed without the administration of intravenous contrast. Automated exposure control, iterative reconstruction, and/or weight based adjustment of the mA/kV was utilized to reduce the radiation dose to as low as reasonably achievable. COMPARISON: 11/15/2024 CLINICAL HISTORY: Head trauma, moderate-severe. Head trauma, moderate to severe. FINDINGS: BRAIN AND VENTRICLES: No acute hemorrhage. No evidence of acute infarct. No hydrocephalus. No extra-axial collection. No mass effect or midline shift. Similar appearance of hypoattenuation in the periventricular and subcortical white matter suggestive of severe chronic microvascular ischemic changes. Atherosclerosis of the carotid siphons and intracranial vertebral arteries. ORBITS: No acute abnormality. SINUSES: No acute abnormality. SOFT TISSUES AND SKULL: No acute soft tissue abnormality. No skull fracture. IMPRESSION: 1. No acute intracranial abnormality. 2. Severe chronic microvascular ischemic changes. Electronically signed by: Donnice Mania MD 12/22/2024 10:30 PM EST RP Workstation: [id number]   DG Pelvis Portable Result Date: 12/22/2024 EXAM: 1 or 2 view(s) Xray of the pelvis 12/22/2024 10:02:13 PM COMPARISON: None available. CLINICAL HISTORY: Trauma Trauma. FINDINGS: BONES AND JOINTS: No acute fracture. No malalignment. SOFT TISSUES: Unremarkable. IMPRESSION: 1. No significant abnormality. Electronically signed by: Franky Crease MD 12/22/2024 10:11 PM EST RP Workstation: [id number]   DG Humerus Left Result Date: 12/22/2024 EXAM: 2 VIEW(S) Xray of the Left Humerus 12/22/2024 10:02:13 PM COMPARISON: None available. CLINICAL HISTORY: Blunt trauma. FINDINGS: BONES AND JOINTS: No acute fracture. No malalignment.  SOFT TISSUES: Unremarkable. IMPRESSION: 1. No significant abnormality. Electronically signed by: Franky Crease MD 12/22/2024 10:11 PM EST RP Workstation: [id number]   DG Forearm Right Result Date: 12/22/2024 EXAM: 2 VIEW(S) Xray of the Right Forearm 12/22/2024 10:02:13 PM COMPARISON: None available. CLINICAL HISTORY: Blunt trauma. FINDINGS: FINDINGS: BONES AND JOINTS: No acute fracture. No malalignment. SOFT TISSUES: Unremarkable. IMPRESSION: 1. No significant abnormality. Electronically signed by: Franky Crease MD 12/22/2024 10:10 PM EST RP Workstation: [id number]   DG Chest Port 1 View Result Date: 12/22/2024 EXAM: 1 VIEW XRAY OF THE CHEST 12/22/2024 10:02:13 PM COMPARISON: 12/09/2024 CLINICAL HISTORY: Trauma. Trauma. FINDINGS: LUNGS AND PLEURA: No focal pulmonary opacity. No pleural effusion. No pneumothorax. HEART AND MEDIASTINUM: Moderate hiatal hernia. No acute abnormality of the cardiac and mediastinal silhouettes. BONES AND SOFT TISSUES: No acute osseous abnormality. IMPRESSION: 1. No acute process. Electronically signed by: Franky Crease MD 12/22/2024 10:09 PM EST RP Workstation: [id number]   DG Ribs Unilateral Left Result Date: 12/09/2024 CLINICAL DATA:  Left rib pain for  4 days EXAM: LEFT RIBS - 2 VIEW COMPARISON:  December 07, 2024 FINDINGS: No fracture or other bone lesions are seen involving the ribs. IMPRESSION: Negative. Electronically Signed   By: Lynwood Landy Raddle M.D.   On: 12/09/2024 13:43   NM Pulmonary Perfusion Result Date: 12/08/2024 CLINICAL DATA:  Chest pain. EXAM: NUCLEAR MEDICINE PERFUSION LUNG SCAN TECHNIQUE: Perfusion images were obtained in multiple projections after intravenous injection of radiopharmaceutical. Ventilation scans intentionally deferred if perfusion scan and chest x-ray adequate for interpretation during COVID 19 epidemic. RADIOPHARMACEUTICALS:  4.4 mCi Tc-90m MAA IV COMPARISON:  Chest plain film, dated December 07, 2024 FINDINGS: Normal, diffuse, predominately  homogeneous distribution of tracer activity is seen throughout both lungs without evidence of focal segmental are subsegmental perfusion defects. IMPRESSION: Normal nuclear medicine perfusion lung scan. Electronically Signed   By: Suzen Dials M.D.   On: 12/08/2024 16:59   DG Chest Port 1 View Result Date: 12/07/2024 CLINICAL DATA:  Chest pain EXAM: PORTABLE CHEST 1 VIEW COMPARISON:  11/09/2024 FINDINGS: Normal-sized heart. Decreased inspiration with interval left lower lobe and lingular atelectasis and minimal right basilar atelectasis. Otherwise, clear lungs with normal vascularity. Diffuse osteopenia. Right inferior glenohumeral loose body. Stable mild scoliosis. IMPRESSION: Decreased inspiration with interval left lower lobe and lingular atelectasis and minimal right basilar atelectasis. Electronically Signed   By: Elspeth Bathe M.D.   On: 12/07/2024 14:45    EKG/Medicine tests: Ordered and Independent interpretation EKG Interpretation: Sinus rhythm Ventricular premature complex Low voltage, precordial leads Borderline prolonged QT interval Confirmed by Rogelia Satterfield ([id number]) on 12/22/2024 9:23:00 PM                          Interventions: LR bolus, Tylenol , Zofran , Valium , lidocaine   See the EMR for full details regarding lab and imaging results.  Currently, patient is awake, alert, and protecting own airway and is hemodynamically stable.  Given patient fell down multiple steps she is at risk per the [place] CT C-spine rule, and patient is GCS of 14 with slight confusion but the patient at risk for the [place] CT head rule, in addition with patient's borderline age of 59 do feel that patient is most appropriate to receive head and neck imaging.  Additionally will obtain screening chest x-ray and pelvic x-ray, as well as focused x-ray of the right forearm and left upper arm given ecchymosis to these areas.  I do feel that patient requires screening CBC, BMP in the setting of trauma, as well as  coags in the setting of altered mental status in case patient has underlying ICH.  Additionally will obtain screening EKG given patient has a history of prolonged QT syndrome.  Imaging obtained and fortunately does not show any acute traumatic injuries.  On reevaluation after interventions, patient has resolution of nausea/vomiting, resolution of headache, and is GCS 15.  Patient reports that she does have some left lateral paraspinal cervical muscular pain and tenderness, however no other acute findings on tertiary survey.  Feel that this is most consistent with cervical strain.  Discussed multimodal pain therapy, and follow-up with PCP.  Patient had 2 friends that had presented to bedside, patient and friends at bedside expressed understanding, comfortable with this plan, discharged in stable condition.  Presentation is most consistent with acute complicated illness and I did consider and rule out acute life/limb-threatening illness  Discussion of management or test interpretations with external provider(s): Not indicated  Risk Drugs:OTC drugs and Prescription drug management  Disposition: DISCHARGE: I believe that the patient is safe for discharge home with outpatient follow-up. Patient was informed of all pertinent physical exam, laboratory, and imaging findings. Patient's suspected etiology of their symptom presentation was discussed with the patient and all questions were answered. We discussed following up with PCP. I provided thorough ED return precautions. The patient feels safe and comfortable with this plan.  MDM generated using voice dictation software and may contain dictation errors.  Please contact me for any clarification or with any questions.  Clinical Impression:  1. Fall down stairs, initial encounter      Discharge   Final Clinical Impression(s) / ED Diagnoses Final diagnoses:  Fall down stairs, initial encounter    Rx / DC Orders ED Discharge Orders     None         Rogelia Jerilynn RAMAN, MD 12/22/24 2308  "

## 2024-12-22 NOTE — Discharge Instructions (Signed)
 [name] Hinton  Thank you for allowing us  to take care of you today.  You came to the Emergency Department today because of a fall down some stairs.  Here in the emergency department you were complaining of a headache and some neck pain, and you also had some bruising on both of your arms.  We got x-rays of your arms, chest, and pelvis and we are not seeing any bones that are broken or out of place.  Given your falls down the stairs as well as headache and you are vomiting we got a CT of your head as well as your neck and we are not seeing any bleeding in your head or broken bones in your head or neck.  Your pain is most likely due to muscle pain from the fall.  We recommend Tylenol  and ibuprofen as needed for pain control, as well as lidocaine  patches, you can buy lidocaine  patches over-the-counter at most pharmacies and grocery stores.  You can use Tylenol  and ibuprofen every 4-6 hours as needed for pain control.  You can take these medications together for maximum pain control, or you can alternate between the 2 for having medication more frequently, for example Tylenol  at noon, ibuprofen at 3 PM, Tylenol  at 6 PM, ibuprofen at 9 PM, etc.  For adults, the dosing is 600 mg of ibuprofen, and 500 mg of Tylenol .  Please do not exceed 4 g of Tylenol  within 24 hours.  Please do not exceed 3200 mg ibuprofen 24 hours.    To-Do: 1. Please follow-up with your primary doctor within 1 - 2 weeks / as soon as possible.   Please return to the Emergency Department or call 911 if you experience have worsening of your symptoms, or do not get better, difficulty moving or feeling part of your body that usually able to move or feel, chest pain, shortness of breath, severe or significantly worsening pain, high fever, severe confusion, pass out or have any reason to think that you need emergency medical care.   We hope you feel better soon.   Mitzie Later, MD Department of Emergency Medicine [facility]
